# Patient Record
Sex: Male | Born: 1945 | Race: White | Hispanic: No | Marital: Married | Smoking: Former smoker
Health system: Southern US, Community
[De-identification: ages and names within clinical notes are randomized; demographics above are authoritative.]

## PROBLEM LIST (undated history)

## (undated) DIAGNOSIS — F329 Major depressive disorder, single episode, unspecified: Secondary | ICD-10-CM

## (undated) DIAGNOSIS — G2581 Restless legs syndrome: Secondary | ICD-10-CM

## (undated) DIAGNOSIS — C4490 Unspecified malignant neoplasm of skin, unspecified: Secondary | ICD-10-CM

## (undated) DIAGNOSIS — C679 Malignant neoplasm of bladder, unspecified: Secondary | ICD-10-CM

## (undated) DIAGNOSIS — G4730 Sleep apnea, unspecified: Secondary | ICD-10-CM

## (undated) DIAGNOSIS — K219 Gastro-esophageal reflux disease without esophagitis: Secondary | ICD-10-CM

## (undated) DIAGNOSIS — F32A Depression, unspecified: Secondary | ICD-10-CM

## (undated) DIAGNOSIS — M1990 Unspecified osteoarthritis, unspecified site: Secondary | ICD-10-CM

## (undated) DIAGNOSIS — E119 Type 2 diabetes mellitus without complications: Secondary | ICD-10-CM

## (undated) DIAGNOSIS — N189 Chronic kidney disease, unspecified: Secondary | ICD-10-CM

## (undated) DIAGNOSIS — I10 Essential (primary) hypertension: Secondary | ICD-10-CM

## (undated) DIAGNOSIS — I499 Cardiac arrhythmia, unspecified: Secondary | ICD-10-CM

## (undated) DIAGNOSIS — C7951 Secondary malignant neoplasm of bone: Secondary | ICD-10-CM

## (undated) DIAGNOSIS — IMO0001 Reserved for inherently not codable concepts without codable children: Secondary | ICD-10-CM

## (undated) DIAGNOSIS — F419 Anxiety disorder, unspecified: Secondary | ICD-10-CM

## (undated) HISTORY — DX: Sleep apnea, unspecified: G47.30

## (undated) HISTORY — DX: Major depressive disorder, single episode, unspecified: F32.9

## (undated) HISTORY — DX: Depression, unspecified: F32.A

## (undated) HISTORY — DX: Malignant neoplasm of bladder, unspecified: C67.9

## (undated) HISTORY — DX: Unspecified malignant neoplasm of skin, unspecified: C44.90

## (undated) HISTORY — PX: KNEE ARTHROSCOPY: SUR90

## (undated) HISTORY — DX: Anxiety disorder, unspecified: F41.9

## (undated) HISTORY — DX: Essential (primary) hypertension: I10

## (undated) HISTORY — PX: APPENDECTOMY: SHX54

## (undated) HISTORY — DX: Unspecified osteoarthritis, unspecified site: M19.90

## (undated) HISTORY — PX: JOINT REPLACEMENT: SHX530

## (undated) HISTORY — DX: Type 2 diabetes mellitus without complications: E11.9

---

## 2010-03-09 ENCOUNTER — Ambulatory Visit: Payer: Self-pay | Admitting: Family Medicine

## 2010-03-09 ENCOUNTER — Emergency Department: Payer: Self-pay | Admitting: Internal Medicine

## 2010-07-25 HISTORY — PX: REPLACEMENT TOTAL KNEE: SUR1224

## 2010-08-16 ENCOUNTER — Ambulatory Visit: Payer: Self-pay | Admitting: Unknown Physician Specialty

## 2010-08-17 ENCOUNTER — Ambulatory Visit: Payer: Self-pay | Admitting: Unknown Physician Specialty

## 2010-08-23 ENCOUNTER — Inpatient Hospital Stay: Payer: Self-pay | Admitting: Unknown Physician Specialty

## 2010-09-27 ENCOUNTER — Encounter: Payer: Self-pay | Admitting: Unknown Physician Specialty

## 2015-01-29 ENCOUNTER — Ambulatory Visit: Payer: Medicare Other | Attending: Internal Medicine

## 2015-01-29 DIAGNOSIS — G4733 Obstructive sleep apnea (adult) (pediatric): Secondary | ICD-10-CM | POA: Insufficient documentation

## 2015-02-26 ENCOUNTER — Ambulatory Visit: Payer: Medicare Other | Attending: Internal Medicine

## 2015-02-26 DIAGNOSIS — G4733 Obstructive sleep apnea (adult) (pediatric): Secondary | ICD-10-CM | POA: Insufficient documentation

## 2016-01-13 ENCOUNTER — Other Ambulatory Visit: Payer: Self-pay | Admitting: Family Medicine

## 2016-01-13 DIAGNOSIS — R109 Unspecified abdominal pain: Secondary | ICD-10-CM

## 2016-01-18 ENCOUNTER — Ambulatory Visit
Admission: RE | Admit: 2016-01-18 | Discharge: 2016-01-18 | Disposition: A | Discharge status: Home / Self Care | Payer: Medicare Other | Source: Ambulatory Visit | Attending: Family Medicine | Admitting: Family Medicine

## 2016-01-18 ENCOUNTER — Ambulatory Visit: Payer: Medicare Other

## 2016-01-18 DIAGNOSIS — R109 Unspecified abdominal pain: Secondary | ICD-10-CM | POA: Diagnosis present

## 2016-01-18 DIAGNOSIS — N281 Cyst of kidney, acquired: Secondary | ICD-10-CM | POA: Insufficient documentation

## 2016-02-15 ENCOUNTER — Encounter: Payer: Self-pay | Admitting: Urology

## 2016-02-15 ENCOUNTER — Ambulatory Visit (INDEPENDENT_AMBULATORY_CARE_PROVIDER_SITE_OTHER): Payer: Medicare Other | Admitting: Urology

## 2016-02-15 VITALS — BP 203/92 | HR 69 | Ht 73.0 in | Wt 306.0 lb

## 2016-02-15 DIAGNOSIS — R3129 Other microscopic hematuria: Secondary | ICD-10-CM | POA: Diagnosis not present

## 2016-02-15 DIAGNOSIS — R109 Unspecified abdominal pain: Secondary | ICD-10-CM

## 2016-02-15 LAB — URINALYSIS, COMPLETE
BILIRUBIN UA: NEGATIVE
Glucose, UA: NEGATIVE
KETONES UA: NEGATIVE
LEUKOCYTES UA: NEGATIVE
NITRITE UA: NEGATIVE
PH UA: 5.5 (ref 5.0–7.5)
SPEC GRAV UA: 1.015 (ref 1.005–1.030)
UUROB: 0.2 mg/dL (ref 0.2–1.0)

## 2016-02-15 LAB — MICROSCOPIC EXAMINATION: BACTERIA UA: NONE SEEN

## 2016-02-15 NOTE — Progress Notes (Signed)
02/15/2016 3:09 PM   Tyler Henderson 1946/01/13 [id number]  Referring provider: Sofie Hartigan, MD [place], [place] [address]  Chief Complaint  Patient presents with  . New Patient (Initial Visit)    Hematuria    HPI: Patient is a 70 -year-old Caucasian male who presents today as a referral from their PCP, Dr. Ellison Hughs, for microscopic hematuria.  Patient was found to have microscopic hematuria on 01/13/2016 with 0-3 RBC's/hpf.  Patient  does have a prior history of microscopic hematuria.  UA in 01/2014 was positive for 0-3 RBC's/hpf.  He states his urine has been darker.  He does have a prior history of nephrolithiasis, but denies trauma to the genitourinary tract, BPH or malignancies of the genitourinary tract.   He does not have a family medical history of nephrolithiasis, malignancies of the genitourinary tract or hematuria.    Today, he is having symptoms of nocturia x 4.  He is not having incontinence, hesitancy, intermittency, straining to urinate or a weak urinary stream.  His UA today does not demonstrate hematuria.  He has been experiencing right flank pain,  but denies any suprapubic pain and/or abdominal pain.  He denies any recent fevers, chills, nausea or vomiting.   Patient has had a recent RUS which noted mid and lower pole cysts on the left and midpole cyst on the right.  There is no hydronephrosis nor evidence of stones. The urinary bladder is unremarkable.  I have personally viewed the films.    He is a former smoker, with a 30 ppd history.  Quit 20 years ago.  He is not exposed to secondhand smoke.  He does not work with chemicals.    He does drink a lot of [employer] and [employer]'s.     PMH: Past Medical History:  Diagnosis Date  . Anxiety   . Arthritis   . Depression   . Diabetes ([place])   . Hypertension   . Sleep apnea     Surgical History: Past Surgical History:  Procedure Laterality Date    . REPLACEMENT TOTAL KNEE Left 2012    Home Medications:    Medication List       Accurate as of 02/15/16  3:09 PM. Always use your most recent med list.          amLODipine 10 MG tablet Commonly known as:  NORVASC Take 1 tablet by mouth once a day   aspirin EC 81 MG tablet Take by mouth.   atenolol 25 MG tablet Commonly known as:  TENORMIN Take 1 tablet by mouth once daily   atorvastatin 40 MG tablet Commonly known as:  LIPITOR Take 1 tablet by mouth once a day   carvedilol 12.5 MG tablet Commonly known as:  COREG Take 1 tablet by mouth  twice a day   FISH OIL PO Take by mouth.   furosemide 20 MG tablet Commonly known as:  LASIX Take by mouth.   gabapentin 300 MG capsule Commonly known as:  NEURONTIN Take by mouth.   glipiZIDE 10 MG tablet Commonly known as:  GLUCOTROL Take 1 tablet by mouth once a day   lisinopril 40 MG tablet Commonly known as:  PRINIVIL,ZESTRIL Take 1 tablet by mouth once daily   metFORMIN 1000 MG tablet Commonly known as:  GLUCOPHAGE Take 1 tablet by mouth  twice a day with meals   MULTI-VITAMINS Tabs Take by mouth.   omeprazole 20 MG capsule  Commonly known as:  PRILOSEC Take 1 capsule by mouth two times daily   pioglitazone 45 MG tablet Commonly known as:  ACTOS Take 1 tablet by mouth  daily   potassium chloride 10 MEQ tablet Commonly known as:  K-DUR Take 1 tablet by mouth once daily       Allergies: No Known Allergies  Family History: Family History  Problem Relation Age of Onset  . Cancer Mother     Breast  . Heart failure Father   . Prostate cancer Neg Hx   . Bladder Cancer Neg Hx     Social History:  reports that he quit smoking about 38 years ago. He has quit using smokeless tobacco. He reports that he drinks about 1.2 oz of alcohol per week . His drug history is not on file.  ROS: UROLOGY Frequent Urination?: No Hard to postpone urination?: No Burning/pain with urination?: No Get up at night to  urinate?: Yes Leakage of urine?: No Urine stream starts and stops?: No Trouble starting stream?: Yes Do you have to strain to urinate?: No Blood in urine?: No Urinary tract infection?: No Sexually transmitted disease?: Yes Injury to kidneys or bladder?: No Painful intercourse?: No Weak stream?: No Erection problems?: No Penile pain?: No  Gastrointestinal Nausea?: No Vomiting?: No Indigestion/heartburn?: No Diarrhea?: No Constipation?: Yes  Constitutional Fever: No Night sweats?: No Weight loss?: No Fatigue?: Yes  Skin Skin rash/lesions?: No Itching?: No  Eyes Blurred vision?: No Double vision?: No  Ears/Nose/Throat Sore throat?: No Sinus problems?: No  Hematologic/Lymphatic Swollen glands?: No Easy bruising?: No  Cardiovascular Leg swelling?: Yes Chest pain?: No  Respiratory Cough?: No Shortness of breath?: No  Endocrine Excessive thirst?: No  Musculoskeletal Back pain?: Yes Joint pain?: Yes  Neurological Headaches?: No Dizziness?: No  Psychologic Depression?: No Anxiety?: No  Physical Exam: BP (!) 203/92 (BP Location: Left Arm, Patient Position: Sitting, Cuff Size: Large)   Pulse 69   Ht 6\' 1"  (1.854 m)   Wt (!) 306 lb (138.8 kg)   BMI 40.37 kg/m   Constitutional: Well nourished. Alert and oriented, No acute distress. HEENT: [place] AT, moist mucus membranes. Trachea midline, no masses. Cardiovascular: No clubbing, cyanosis, or edema. Respiratory: Normal respiratory effort, no increased work of breathing. GI: Abdomen is soft, non tender, non distended, no abdominal masses. Liver and spleen not palpable.  No hernias appreciated.  Stool sample for occult testing is not indicated.   GU: No CVA tenderness.  No bladder fullness or masses.  Patient with uncircumcised phallus.   Foreskin easily retracted Urethral meatus is patent.  No penile discharge. No penile lesions or rashes. Scrotum without lesions, cysts, rashes and/or edema.  Testicles are  located scrotally bilaterally. No masses are appreciated in the testicles. Left and right epididymis are normal. Rectal: Patient with  normal sphincter tone. Anus and perineum without scarring or rashes. No rectal masses are appreciated. Prostate is approximately 50 grams, no nodules are appreciated. Seminal vesicles are normal. Skin: No rashes, bruises or suspicious lesions. Lymph: No cervical or inguinal adenopathy. Neurologic: Grossly intact, no focal deficits, moving all 4 extremities. Psychiatric: Normal mood and affect.  Laboratory Data: PSA in 2007 was 0.8  Urinalysis Not significant for hematuria.  See EPIC.  Pertinent Imaging: CLINICAL DATA:  Right flank pain  EXAM: RENAL / URINARY TRACT ULTRASOUND COMPLETE  COMPARISON:  None in PACs  FINDINGS: Right Kidney:  Length: 12.7 cm. Echogenicity within normal limits. There is a tiny midpole cyst measuring 1.3 x  0.9 cm. No hydronephrosis visualized.  Left Kidney:  Length: 14.5 cm. The echotexture of the renal parenchyma is normal similar to that on the right. There is a dominant midpole cyst that is partially exophytic measuring 5.8 x 5.4 x 7 cm. There is a lower pole cortical cyst measuring 1.7 x 1.2 by 1.6 cm. There is no hydronephrosis.  Bladder:  Appears normal for degree of bladder distention. Bilateral ureteral jets are observed.  IMPRESSION: Mid and lower pole cysts on the left and midpole cyst on the right. There is no hydronephrosis nor evidence of stones. The urinary bladder is unremarkable.   Electronically Signed   By: David  Martinique M.D.   On: 01/18/2016 10:36   Assessment & Plan:     1. Microscopic hematuria:   I explained to the patient that there are a number of causes that can be associated with blood in the urine, such as stones, BPH, UTI's, damage to the urinary tract and/or cancer.  At this time, I felt that the patient warranted further urologic evaluation.   The AUA guidelines  state that a CT urogram is the preferred imaging study to evaluate hematuria.  I explained to the patient that a contrast material will be injected into a vein and that in rare instances, an allergic reaction can result and may even life threatening   The patient denies any allergies to contrast, iodine and/or seafood and is taking metformin.  Following the imaging study,  I've recommended a cystoscopy. I described how this is performed, typically in an office setting with a flexible cystoscope. We described the risks, benefits, and possible side effects, the most common of which is a minor amount of blood in the urine and/or burning which usually resolves in 24 to 48 hours.    The patient had the opportunity to ask questions which were answered. Based upon this discussion, the patient is willing to proceed. Therefore, I've ordered: a CT Urogram and cystoscopy.  He will return following all of the above for discussion of the results.     2. Right flank pain:   Patient will be undergoing a CT Urogram in the future.    Return for CT Urogram report and cystoscopy.  These notes generated with voice recognition software. I apologize for typographical errors.  Zara Council, [facility] [address] [place], [place] [address] [phone]

## 2016-02-19 LAB — CULTURE, URINE COMPREHENSIVE

## 2016-02-23 ENCOUNTER — Ambulatory Visit
Admission: RE | Admit: 2016-02-23 | Discharge: 2016-02-23 | Disposition: A | Discharge status: Home / Self Care | Payer: Medicare Other | Source: Ambulatory Visit | Attending: Urology | Admitting: Urology

## 2016-02-23 ENCOUNTER — Other Ambulatory Visit
Admission: RE | Admit: 2016-02-23 | Discharge: 2016-02-23 | Disposition: A | Discharge status: Home / Self Care | Payer: Medicare Other | Source: Ambulatory Visit | Attending: Urology | Admitting: Urology

## 2016-02-23 DIAGNOSIS — K760 Fatty (change of) liver, not elsewhere classified: Secondary | ICD-10-CM | POA: Insufficient documentation

## 2016-02-23 DIAGNOSIS — R3129 Other microscopic hematuria: Secondary | ICD-10-CM | POA: Insufficient documentation

## 2016-02-23 DIAGNOSIS — I700 Atherosclerosis of aorta: Secondary | ICD-10-CM | POA: Diagnosis not present

## 2016-02-23 LAB — BUN: BUN: 36 mg/dL — ABNORMAL HIGH (ref 6–20)

## 2016-02-23 LAB — CREATININE, SERUM
Creatinine, Ser: 1.63 mg/dL — ABNORMAL HIGH (ref 0.61–1.24)
GFR calc non Af Amer: 40 mL/min — ABNORMAL LOW (ref >60)
GFR, EST AFRICAN AMERICAN: 47 mL/min — AB (ref >60)

## 2016-02-23 MED ORDER — IOPAMIDOL (ISOVUE-300) INJECTION 61%
100.0000 mL | Freq: Once | INTRAVENOUS | Status: AC | PRN
  Administered 2016-02-23: 100 mL via INTRAVENOUS

## 2016-03-07 ENCOUNTER — Ambulatory Visit (INDEPENDENT_AMBULATORY_CARE_PROVIDER_SITE_OTHER): Payer: Medicare Other | Admitting: Urology

## 2016-03-07 DIAGNOSIS — N183 Chronic kidney disease, stage 3 unspecified: Secondary | ICD-10-CM | POA: Insufficient documentation

## 2016-03-07 DIAGNOSIS — N281 Cyst of kidney, acquired: Secondary | ICD-10-CM

## 2016-03-07 DIAGNOSIS — R3129 Other microscopic hematuria: Secondary | ICD-10-CM | POA: Diagnosis not present

## 2016-03-07 DIAGNOSIS — N200 Calculus of kidney: Secondary | ICD-10-CM

## 2016-03-07 LAB — URINALYSIS, COMPLETE
BILIRUBIN UA: NEGATIVE
GLUCOSE, UA: NEGATIVE
KETONES UA: NEGATIVE
LEUKOCYTES UA: NEGATIVE
NITRITE UA: NEGATIVE
SPEC GRAV UA: 1.015 (ref 1.005–1.030)
Urobilinogen, Ur: 0.2 mg/dL (ref 0.2–1.0)
pH, UA: 7.5 (ref 5.0–7.5)

## 2016-03-07 LAB — MICROSCOPIC EXAMINATION
BACTERIA UA: NONE SEEN
WBC, UA: NONE SEEN /hpf (ref 0–?)

## 2016-03-07 MED ORDER — LIDOCAINE HCL 2 % EX GEL
1.0000 "application " | Freq: Once | CUTANEOUS | Status: AC
  Administered 2016-03-07: 1 via URETHRAL

## 2016-03-07 MED ORDER — CIPROFLOXACIN HCL 500 MG PO TABS
500.0000 mg | ORAL_TABLET | Freq: Once | ORAL | Status: AC
  Administered 2016-03-07: 500 mg via ORAL

## 2016-03-07 NOTE — Progress Notes (Signed)
03/07/2016 6:32 AM   Tyler Henderson 15-Sep-1945 [id number]  Referring provider: Sofie Hartigan, MD [place], [place] [address]  No chief complaint on file.   HPI:  1. Bladder Cancer - blood on UA noted by PCP x several and at Urol office. Prior 30PY smoker. CT Urogram 02/2016 with 26mm renal stone / no worriseom masses. Cysto 02/2016 with about dime sized papillary tumor just lateral to Rt UO, mostly flat.   2. Simple renal cyst - Lt>Rt non-complex cysts without mass effect on CT urogram 02/2016. Dominant cyst LLP 7cm.   3. Nephrolithiasis  - 83mm stone Rt kidney non-obstructing by CT 02/2016. Several prior stones passed medically.   4. Chronic renal failure, stage 3  - GFR upper 40's by serum markers 2017. CT w/o hydro. He is diabetic and with CHF.  Today " Tyler Henderson " is seen for cysto to complete hematuria eval.    PMH: Past Medical History:  Diagnosis Date  . Anxiety   . Arthritis   . Depression   . Diabetes ([place])   . Hypertension   . Sleep apnea     Surgical History: Past Surgical History:  Procedure Laterality Date  . REPLACEMENT TOTAL KNEE Left 2012    Home Medications:    Medication List       Accurate as of 03/07/16  6:32 AM. Always use your most recent med list.          amLODipine 10 MG tablet Commonly known as:  NORVASC Take 1 tablet by mouth once a day   aspirin EC 81 MG tablet Take by mouth.   atenolol 25 MG tablet Commonly known as:  TENORMIN Take 1 tablet by mouth once daily   atorvastatin 40 MG tablet Commonly known as:  LIPITOR Take 1 tablet by mouth once a day   carvedilol 12.5 MG tablet Commonly known as:  COREG Take 1 tablet by mouth  twice a day   FISH OIL PO Take by mouth.   furosemide 20 MG tablet Commonly known as:  LASIX Take by mouth.   gabapentin 300 MG capsule Commonly known as:  NEURONTIN Take by mouth.   glipiZIDE 10 MG tablet Commonly known as:   GLUCOTROL Take 1 tablet by mouth once a day   lisinopril 40 MG tablet Commonly known as:  PRINIVIL,ZESTRIL Take 1 tablet by mouth once daily   metFORMIN 1000 MG tablet Commonly known as:  GLUCOPHAGE Take 1 tablet by mouth  twice a day with meals   MULTI-VITAMINS Tabs Take by mouth.   omeprazole 20 MG capsule Commonly known as:  PRILOSEC Take 1 capsule by mouth two times daily   pioglitazone 45 MG tablet Commonly known as:  ACTOS Take 1 tablet by mouth  daily   potassium chloride 10 MEQ tablet Commonly known as:  K-DUR Take 1 tablet by mouth once daily       Allergies: No Known Allergies  Family History: Family History  Problem Relation Age of Onset  . Cancer Mother     Breast  . Heart failure Father   . Prostate cancer Neg Hx   . Bladder Cancer Neg Hx     Social History:  reports that he quit smoking about 38 years ago. He has quit using smokeless tobacco. He reports that he drinks about 1.2 oz of alcohol per week . His drug history is not on file.   Review of Systems  Gastrointestinal (  upper)  : Negative for upper GI symptoms  Gastrointestinal (lower) : Negative for lower GI symptoms  Constitutional : Negative for symptoms  Skin: Negative for skin symptoms  Eyes: Negative for eye symptoms  Ear/Nose/Throat : Negative for Ear/Nose/Throat symptoms  Hematologic/Lymphatic: Negative for Hematologic/Lymphatic symptoms  Cardiovascular : Negative for cardiovascular symptoms  Respiratory : sleep apnea  Endocrine: Negative for endocrine symptoms  Musculoskeletal: Negative for musculoskeletal symptoms  Neurological: Negative for neurological symptoms  Psychologic: Negative for psychiatric symptoms   Physical Exam: There were no vitals taken for this visit.  Constitutional:  Alert and oriented, No acute distress. HEENT: [place] AT, moist mucus membranes.  Trachea midline, no masses. Cardiovascular: No clubbing, cyanosis, or edema. Respiratory:  Normal respiratory effort, no increased work of breathing. GI: Abdomen is soft, nontender, nondistended, no abdominal masses GU: No CVA tenderness.  Skin: No rashes, bruises or suspicious lesions. Lymph: No cervical or inguinal adenopathy. Neurologic: Grossly intact, no focal deficits, moving all 4 extremities. Psychiatric: Normal mood and affect.  Laboratory Data: No results found for: WBC, HGB, HCT, MCV, PLT  Lab Results  Component Value Date   CREATININE 1.63 (H) 02/23/2016    No results found for: PSA  No results found for: TESTOSTERONE  No results found for: HGBA1C  Urinalysis    Component Value Date/Time   APPEARANCEUR Clear 02/15/2016 1432   GLUCOSEU Negative 02/15/2016 1432   BILIRUBINUR Negative 02/15/2016 1432   PROTEINUR 2+ (A) 02/15/2016 1432   NITRITE Negative 02/15/2016 1432   LEUKOCYTESUR Negative 02/15/2016 1432       Cystoscopy Procedure Note  Patient identification was confirmed, informed consent was obtained, and patient was prepped using Betadine solution.  Lidocaine jelly was administered per urethral meatus.    Preoperative abx where received prior to procedure.     Pre-Procedure: - Inspection reveals a normal caliber ureteral meatus.  Procedure: The flexible cystoscope was introduced without difficulty - No urethral strictures/lesions are present. - Normal prostate  - Normal bladder neck - Bilateral ureteral orifices identified - No bladder stones - No trabeculation  Dime sized predominantly flat papillary tumor lateral to Rt UO noted.   Retroflexion shows no additional findints   Post-Procedure: - Patient tolerated the procedure well   Pertinent Imaging: as per above  Assessment & Plan:   1. Bladder Cancer  -  Likely small low grade tumor by cysto appearance, but do rec formal TURBT for histologic analysis. Given proximity to Rt UO, may need Rt retrograde / stent. As lesions does not involved UO, I do not feel ureteroscopy is  needed.   Risks, benefits, alternatives, peri-op Henderson, need for possible temporary foley post-op discussed. UA today normal.    2. Simple renal cyst  - no mass effect or enhancing nodules. No indication for further evaluation.   3. Nephrolithiasis - very small non-obstructing stone burden. Rec observation. THis would likely pass with medical therapy should it drop.   4. Chronic renal failure, stage 3 (moderate)  - medical renal disesae. Reinforced importance of BP and glycemic control.     Alexis Frock, [facility] [address] [place], [place] [address] [phone]

## 2016-03-08 ENCOUNTER — Other Ambulatory Visit: Payer: Self-pay | Admitting: Urology

## 2016-03-08 ENCOUNTER — Telehealth: Payer: Self-pay | Admitting: Radiology

## 2016-03-08 ENCOUNTER — Other Ambulatory Visit: Payer: Self-pay | Admitting: Radiology

## 2016-03-08 DIAGNOSIS — D494 Neoplasm of unspecified behavior of bladder: Secondary | ICD-10-CM

## 2016-03-08 NOTE — Telephone Encounter (Signed)
Notified pt's wife, Blanch Media, of surgery scheduled 03/14/16, pre-admit testing appt on 03/09/16 @2 :00 & to call Friday prior to surgery for arrival time to SDS. Blanch Media voices understanding.

## 2016-03-09 ENCOUNTER — Encounter
Admission: RE | Admit: 2016-03-09 | Discharge: 2016-03-09 | Disposition: A | Discharge status: Home / Self Care | Payer: Medicare Other | Source: Ambulatory Visit | Attending: Urology | Admitting: Urology

## 2016-03-09 DIAGNOSIS — Z01812 Encounter for preprocedural laboratory examination: Secondary | ICD-10-CM | POA: Insufficient documentation

## 2016-03-09 DIAGNOSIS — Z01810 Encounter for preprocedural cardiovascular examination: Secondary | ICD-10-CM | POA: Insufficient documentation

## 2016-03-09 HISTORY — DX: Reserved for inherently not codable concepts without codable children: IMO0001

## 2016-03-09 HISTORY — DX: Chronic kidney disease, unspecified: N18.9

## 2016-03-09 HISTORY — DX: Gastro-esophageal reflux disease without esophagitis: K21.9

## 2016-03-09 HISTORY — DX: Restless legs syndrome: G25.81

## 2016-03-09 LAB — BASIC METABOLIC PANEL
ANION GAP: 10 (ref 5–15)
BUN: 25 mg/dL — ABNORMAL HIGH (ref 6–20)
CALCIUM: 9.3 mg/dL (ref 8.9–10.3)
CO2: 28 mmol/L (ref 22–32)
Chloride: 102 mmol/L (ref 101–111)
Creatinine, Ser: 1.53 mg/dL — ABNORMAL HIGH (ref 0.61–1.24)
GFR, EST AFRICAN AMERICAN: 51 mL/min — AB (ref >60)
GFR, EST NON AFRICAN AMERICAN: 44 mL/min — AB (ref >60)
Glucose, Bld: 138 mg/dL — ABNORMAL HIGH (ref 65–99)
POTASSIUM: 4.3 mmol/L (ref 3.5–5.1)
SODIUM: 140 mmol/L (ref 135–145)

## 2016-03-09 LAB — CBC
HEMATOCRIT: 35.1 % — AB (ref 40.0–52.0)
HEMOGLOBIN: 12 g/dL — AB (ref 13.0–18.0)
MCH: 31 pg (ref 26.0–34.0)
MCHC: 34.3 g/dL (ref 32.0–36.0)
MCV: 90.3 fL (ref 80.0–100.0)
Platelets: 217 10*3/uL (ref 150–440)
RBC: 3.89 MIL/uL — ABNORMAL LOW (ref 4.40–5.90)
RDW: 12.8 % (ref 11.5–14.5)
WBC: 7.6 10*3/uL (ref 3.8–10.6)

## 2016-03-09 NOTE — Patient Instructions (Signed)
  Your procedure is scheduled PA:[id number] 21. 2017 (Monday)  Report to Same Day Surgery 2nd floor [facility] To find out your arrival time please call [phone] between 1PM - 3PM on March 11, 2016 (Friday)  Remember: Instructions that are not followed completely may result in serious medical risk, up to and including death, or upon the discretion of your surgeon and anesthesiologist your surgery may need to be rescheduled.    _x___ 1. Do not eat food or drink liquids after midnight. No gum chewing or hard candies.     _x_ 2. No Alcohol for 24 hours before or after surgery.   _x___3. No Smoking for 24 prior to surgery.   ____  4. Bring all medications with you on the day of surgery if instructed.    __x__ 5. Notify your doctor if there is any change in your medical condition     (cold, fever, infections).     Do not wear jewelry, make-up, hairpins, clips or nail polish.  Do not wear lotions, powders, or perfumes. You may wear deodorant.  Do not shave 48 hours prior to surgery. Men may shave face and neck.  Do not bring valuables to the hospital.    [facility] is not responsible for any belongings or valuables.               Contacts, dentures or bridgework may not be worn into surgery.  Leave your suitcase in the car. After surgery it may be brought to your room.  For patients admitted to the hospital, discharge time is determined by your treatment team.   Patients discharged the day of surgery will not be allowed to drive home.    Please read over the following fact sheets that you were given:   [facility] Preparing for Surgery and or MRSA Information   _x___ Take these medicines the morning of surgery with A SIP OF WATER:    1. Carvedilol  2. Lisinopril  3. Omeprazole  4.  5.  6.  ____ Fleet Enema (as directed)   ___ Use CHG Soap or sage wipes as directed on instruction sheet   ____ Use inhalers on the day of surgery and bring to hospital day of surgery  _x___  Stop metformin 2 days prior to surgery (Stop Metformin on August 19)    ____ Take 1/2 of usual insulin dose the night before surgery and none on the morning of surgery            _x___ Stop aspirin or coumadin, or plavix (patient stopped Aspirin on August 7)  _x__ Stop Anti-inflammatories such as Advil, Aleve, Ibuprofen, Motrin, Naproxen,          Naprosyn, Goodies powders or aspirin products. Ok to take Tylenol.   _x___ Stop supplements until after surgery.  (Stop Fish Oil and Multi Vitamin now until after surgery)  _x___ Bring C-Pap to the hospital.

## 2016-03-09 NOTE — Pre-Procedure Instructions (Signed)
  Result Narrative   [facility], [facility] [id number] [place], [place], [place] [id number] Acct #: [id number]  Date: 01/01/2015 09:58 AM ECHOCARDIOGRAM REPORTAdult Male Age: 70 yrs  Outpatient  KC::[facility]  STUDY:CHEST WALL TAPE:0000:00: 0:00:00MD1:FATH, KEN ECHO:Yes DOPPLER:YesFILE:0000-000-000BP: 180/100 mmHg  COLOR:YesCONTRAST:No MACHINE:PhilipsHR: 64  RV BIOPSY:No 3D:NoSOUND QLTY:Moderate Height: 73 in MEDIUM:NoneWeight: 332 lb  BSA: 2.7 m2  ___________________________________________________________________________________________  HISTORY:DOE REASON:Assess, LV function  ___________________________________________________________________________________________ ECHOCARDIOGRAPHIC MEASUREMENTS 2D DIMENSIONS AORTA ValuesNormal RangeMAIN PAValuesNormal Range Annulus:nm* [2.3 - 2.9]PA Main:nm* [1.5 - 2.1] Aorta Sin:nm* [3.1 - 3.7] RIGHT VENTRICLE ST Junction:nm* [2.6 - 3.2]RV Base:nm* [ < 4.2] Asc.Aorta:nm* [2.6 - 3.4] RV Mid:nm* [ < 3.5]  LEFT VENTRICLERV Length:nm* [ < 8.6] LVIDd:nm* [4.2 - 5.9] INFERIOR  VENA CAVA LVIDs:nm* Max. IVC:nm* [ <= 2.1]  FS:nm* [> 25]Min. IVC:nm* SWT:1.4 cm[0.6 - 1.0] ------------------ PWT:1.2 cm[0.6 - 1.0] nm* - not measured  LEFT ATRIUM LA Diam:4.7 cm[3.0 - 4.0] LA A4C Area:nm* [ < 20] LA Volume:nm* Roice.Felt - 58]  ___________________________________________________________________________________________ ECHOCARDIOGRAPHIC DESCRIPTIONS  AORTIC ROOT Size:Normal Dissection:INDETERM FOR DISSECTION  AORTIC VALVE Leaflets:TricuspidMorphology:Normal Mobility:Fully mobile  LEFT VENTRICLE Size:Normal Anterior:Normal  Contraction:NormalLateral:Normal Closest EF:>55% (Estimated) Septal:Normal  LV Masses:No MassesApical:Normal  [id number] LVH CONCENTRICInferior:Normal  Posterior:Normal Dias.FxClass:N/A  MITRAL VALVE Leaflets:Normal Mobility:Fully mobile Morphology:Normal  LEFT ATRIUM Size:NormalLA Masses:No masses  IA Septum:Normal IAS  MAIN PA Size:Normal  PULMONIC VALVE Morphology:Normal Mobility:Fully mobile  RIGHT VENTRICLE  RV Masses:No MassesSize:Normal  Free Wall:NormalContraction:Normal  TRICUSPID VALVE Leaflets:Normal Mobility:Fully mobile Morphology:Normal  RIGHT ATRIUM Size:Normal RA Other:None  RA Mass:No masses  PERICARDIUM  Fluid:No effusion  INFERIOR VENACAVA Size:Normal Normal respiratory collapse   _____________________________________________________________ DOPPLER ECHO and OTHER SPECIAL  PROCEDURES  Aortic:No AR No AS   Mitral:TRIVIAL MRNo MS MV Inflow E Vel=88.5 cm/sec MV Annulus E'Vel=nm* E/E'Ratio=nm*  Tricuspid:TRIVIAL TRNo TS  Pulmonary:TRIVIAL PRNo PS    ___________________________________________________________________________________________  INTERPRETATION Closest EF: >55% (Estimated) Contraction: Normal LVH: CONCENTRIC LVH: MILD LVH Mitral: TRIVIAL MR Tricuspid: TRIVIAL TR

## 2016-03-14 ENCOUNTER — Encounter: Payer: Self-pay | Admitting: *Deleted

## 2016-03-14 ENCOUNTER — Ambulatory Visit: Payer: Medicare Other | Admitting: Anesthesiology

## 2016-03-14 ENCOUNTER — Ambulatory Visit
Admission: RE | Admit: 2016-03-14 | Discharge: 2016-03-14 | Disposition: A | Discharge status: Home / Self Care | Payer: Medicare Other | Source: Ambulatory Visit | Attending: Urology | Admitting: Urology

## 2016-03-14 ENCOUNTER — Encounter
Admission: RE | Disposition: A | Discharge status: Home / Self Care | Payer: Self-pay | Source: Ambulatory Visit | Attending: Urology

## 2016-03-14 DIAGNOSIS — Z8249 Family history of ischemic heart disease and other diseases of the circulatory system: Secondary | ICD-10-CM | POA: Diagnosis not present

## 2016-03-14 DIAGNOSIS — N183 Chronic kidney disease, stage 3 (moderate): Secondary | ICD-10-CM | POA: Insufficient documentation

## 2016-03-14 DIAGNOSIS — Z79899 Other long term (current) drug therapy: Secondary | ICD-10-CM | POA: Insufficient documentation

## 2016-03-14 DIAGNOSIS — M1990 Unspecified osteoarthritis, unspecified site: Secondary | ICD-10-CM | POA: Diagnosis not present

## 2016-03-14 DIAGNOSIS — D494 Neoplasm of unspecified behavior of bladder: Secondary | ICD-10-CM | POA: Diagnosis not present

## 2016-03-14 DIAGNOSIS — E1122 Type 2 diabetes mellitus with diabetic chronic kidney disease: Secondary | ICD-10-CM | POA: Insufficient documentation

## 2016-03-14 DIAGNOSIS — F419 Anxiety disorder, unspecified: Secondary | ICD-10-CM | POA: Insufficient documentation

## 2016-03-14 DIAGNOSIS — F329 Major depressive disorder, single episode, unspecified: Secondary | ICD-10-CM | POA: Diagnosis not present

## 2016-03-14 DIAGNOSIS — R310 Gross hematuria: Secondary | ICD-10-CM | POA: Diagnosis not present

## 2016-03-14 DIAGNOSIS — C672 Malignant neoplasm of lateral wall of bladder: Secondary | ICD-10-CM | POA: Insufficient documentation

## 2016-03-14 DIAGNOSIS — N281 Cyst of kidney, acquired: Secondary | ICD-10-CM | POA: Insufficient documentation

## 2016-03-14 DIAGNOSIS — I129 Hypertensive chronic kidney disease with stage 1 through stage 4 chronic kidney disease, or unspecified chronic kidney disease: Secondary | ICD-10-CM | POA: Diagnosis not present

## 2016-03-14 DIAGNOSIS — Z7982 Long term (current) use of aspirin: Secondary | ICD-10-CM | POA: Diagnosis not present

## 2016-03-14 DIAGNOSIS — C679 Malignant neoplasm of bladder, unspecified: Secondary | ICD-10-CM | POA: Diagnosis present

## 2016-03-14 DIAGNOSIS — Z803 Family history of malignant neoplasm of breast: Secondary | ICD-10-CM | POA: Diagnosis not present

## 2016-03-14 DIAGNOSIS — N200 Calculus of kidney: Secondary | ICD-10-CM | POA: Diagnosis not present

## 2016-03-14 DIAGNOSIS — G4730 Sleep apnea, unspecified: Secondary | ICD-10-CM | POA: Diagnosis not present

## 2016-03-14 DIAGNOSIS — Z7984 Long term (current) use of oral hypoglycemic drugs: Secondary | ICD-10-CM | POA: Insufficient documentation

## 2016-03-14 DIAGNOSIS — R319 Hematuria, unspecified: Secondary | ICD-10-CM | POA: Diagnosis not present

## 2016-03-14 DIAGNOSIS — Z96652 Presence of left artificial knee joint: Secondary | ICD-10-CM | POA: Insufficient documentation

## 2016-03-14 DIAGNOSIS — Z87891 Personal history of nicotine dependence: Secondary | ICD-10-CM | POA: Insufficient documentation

## 2016-03-14 HISTORY — PX: TRANSURETHRAL RESECTION OF BLADDER TUMOR: SHX2575

## 2016-03-14 LAB — GLUCOSE, CAPILLARY
GLUCOSE-CAPILLARY: 184 mg/dL — AB (ref 65–99)
GLUCOSE-CAPILLARY: 188 mg/dL — AB (ref 65–99)

## 2016-03-14 SURGERY — TURBT (TRANSURETHRAL RESECTION OF BLADDER TUMOR)
Anesthesia: General | Laterality: Right | Wound class: Clean Contaminated

## 2016-03-14 MED ORDER — CEFAZOLIN SODIUM-DEXTROSE 2-4 GM/100ML-% IV SOLN
INTRAVENOUS | Status: AC
  Administered 2016-03-14: 2 g via INTRAVENOUS
  Filled 2016-03-14: qty 100

## 2016-03-14 MED ORDER — OXYBUTYNIN CHLORIDE 5 MG PO TABS: 5.0000 mg | ORAL_TABLET | Freq: Three times a day (TID) | ORAL | 0 refills | Status: DC | PRN

## 2016-03-14 MED ORDER — SUGAMMADEX SODIUM 500 MG/5ML IV SOLN
INTRAVENOUS | Status: DC | PRN
  Administered 2016-03-14: 300 mg via INTRAVENOUS

## 2016-03-14 MED ORDER — SUCCINYLCHOLINE CHLORIDE 20 MG/ML IJ SOLN
INTRAMUSCULAR | Status: DC | PRN
  Administered 2016-03-14: 150 mg via INTRAVENOUS

## 2016-03-14 MED ORDER — MEPERIDINE HCL 25 MG/ML IJ SOLN: 6.2500 mg | INTRAMUSCULAR | Status: DC | PRN

## 2016-03-14 MED ORDER — SODIUM CHLORIDE 0.9 % IV SOLN
INTRAVENOUS | Status: DC
  Administered 2016-03-14: 13:00:00 via INTRAVENOUS

## 2016-03-14 MED ORDER — PROPOFOL 10 MG/ML IV BOLUS
INTRAVENOUS | Status: DC | PRN
  Administered 2016-03-14: 200 mg via INTRAVENOUS

## 2016-03-14 MED ORDER — PROMETHAZINE HCL 25 MG/ML IJ SOLN: 6.2500 mg | INTRAMUSCULAR | Status: DC | PRN

## 2016-03-14 MED ORDER — EPHEDRINE SULFATE 50 MG/ML IJ SOLN
INTRAMUSCULAR | Status: DC | PRN
  Administered 2016-03-14: 10 mg via INTRAVENOUS

## 2016-03-14 MED ORDER — CEFAZOLIN SODIUM-DEXTROSE 2-4 GM/100ML-% IV SOLN
2.0000 g | INTRAVENOUS | Status: AC
  Administered 2016-03-14: 2 g via INTRAVENOUS

## 2016-03-14 MED ORDER — OXYCODONE HCL 5 MG/5ML PO SOLN: 5.0000 mg | Freq: Once | ORAL | Status: DC | PRN

## 2016-03-14 MED ORDER — FENTANYL CITRATE (PF) 100 MCG/2ML IJ SOLN
INTRAMUSCULAR | Status: DC | PRN
  Administered 2016-03-14: 100 ug via INTRAVENOUS

## 2016-03-14 MED ORDER — ROCURONIUM BROMIDE 100 MG/10ML IV SOLN
INTRAVENOUS | Status: DC | PRN
  Administered 2016-03-14: 10 mg via INTRAVENOUS

## 2016-03-14 MED ORDER — PHENAZOPYRIDINE HCL 200 MG PO TABS: 200.0000 mg | ORAL_TABLET | Freq: Three times a day (TID) | ORAL | 0 refills | Status: DC | PRN

## 2016-03-14 MED ORDER — DOCUSATE SODIUM 100 MG PO CAPS: 100.0000 mg | ORAL_CAPSULE | Freq: Two times a day (BID) | ORAL | 0 refills | Status: DC

## 2016-03-14 MED ORDER — HYDROCODONE-ACETAMINOPHEN 5-325 MG PO TABS: 1.0000 | ORAL_TABLET | Freq: Four times a day (QID) | ORAL | 0 refills | Status: DC | PRN

## 2016-03-14 MED ORDER — OXYCODONE HCL 5 MG PO TABS: 5.0000 mg | ORAL_TABLET | Freq: Once | ORAL | Status: DC | PRN

## 2016-03-14 MED ORDER — ONDANSETRON HCL 4 MG/2ML IJ SOLN
INTRAMUSCULAR | Status: DC | PRN
  Administered 2016-03-14: 4 mg via INTRAVENOUS

## 2016-03-14 MED ORDER — FENTANYL CITRATE (PF) 100 MCG/2ML IJ SOLN: 25.0000 ug | INTRAMUSCULAR | Status: DC | PRN

## 2016-03-14 MED ORDER — LIDOCAINE HCL (CARDIAC) 20 MG/ML IV SOLN
INTRAVENOUS | Status: DC | PRN
  Administered 2016-03-14: 100 mg via INTRAVENOUS

## 2016-03-14 SURGICAL SUPPLY — 40 items
BAG DRAIN CYSTO-URO LG1000N (MISCELLANEOUS) ×4 IMPLANT
BAG URO DRAIN 2000ML W/SPOUT (MISCELLANEOUS) ×4 IMPLANT
CATH FOL 2WAY LX 16X5 (CATHETERS) IMPLANT
CATH FOLEY 2WAY  5CC 16FR (CATHETERS) ×2
CATH URETL 5X70 OPEN END (CATHETERS) ×4 IMPLANT
CATH URTH 16FR FL 2W BLN LF (CATHETERS) ×2 IMPLANT
CONRAY 43 FOR UROLOGY 50M (MISCELLANEOUS) ×4 IMPLANT
CORD URO TURP 10FT (MISCELLANEOUS) ×4 IMPLANT
DRAPE UTILITY 15X26 TOWEL STRL (DRAPES) ×4 IMPLANT
DRESSING TELFA 4X3 1S ST N-ADH (GAUZE/BANDAGES/DRESSINGS) ×4 IMPLANT
ELECT LOOP 22F BIPOLAR SML (ELECTROSURGICAL) ×4
ELECT REM PT RETURN 9FT ADLT (ELECTROSURGICAL)
ELECTRODE LOOP 22F BIPOLAR SML (ELECTROSURGICAL) ×2 IMPLANT
ELECTRODE REM PT RTRN 9FT ADLT (ELECTROSURGICAL) IMPLANT
GLOVE BIO SURGEON STRL SZ 6.5 (GLOVE) ×3 IMPLANT
GLOVE BIO SURGEONS STRL SZ 6.5 (GLOVE) ×1
GOWN STRL REUS W/ TWL LRG LVL3 (GOWN DISPOSABLE) ×4 IMPLANT
GOWN STRL REUS W/ TWL LRG LVL4 (GOWN DISPOSABLE) ×4 IMPLANT
GOWN STRL REUS W/TWL LRG LVL3 (GOWN DISPOSABLE) ×4
GOWN STRL REUS W/TWL LRG LVL4 (GOWN DISPOSABLE) ×4
HOLDER FOLEY CATH W/STRAP (MISCELLANEOUS) IMPLANT
KIT RM TURNOVER CYSTO AR (KITS) ×4 IMPLANT
LOOP CUT BIPOLAR 24F LRG (ELECTROSURGICAL) IMPLANT
NDL SAFETY ECLIPSE 18X1.5 (NEEDLE) ×2 IMPLANT
NEEDLE HYPO 18GX1.5 SHARP (NEEDLE) ×2
PACK CYSTO AR (MISCELLANEOUS) ×4 IMPLANT
PLUG CATH AND CAP STER (CATHETERS) ×4 IMPLANT
PREP PVP WINGED SPONGE (MISCELLANEOUS) ×4 IMPLANT
SENSORWIRE 0.038 NOT ANGLED (WIRE) ×4
SET CYSTO W/LG BORE CLAMP LF (SET/KITS/TRAYS/PACK) ×4 IMPLANT
SET IRRIG Y TYPE TUR BLADDER L (SET/KITS/TRAYS/PACK) ×4 IMPLANT
SET IRRIGATING DISP (SET/KITS/TRAYS/PACK) ×4 IMPLANT
SOL .9 NS 3000ML IRR  AL (IV SOLUTION) ×2
SOL .9 NS 3000ML IRR UROMATIC (IV SOLUTION) ×2 IMPLANT
STENT URET 6FRX24 CONTOUR (STENTS) IMPLANT
STENT URET 6FRX26 CONTOUR (STENTS) IMPLANT
SURGILUBE 2OZ TUBE FLIPTOP (MISCELLANEOUS) ×4 IMPLANT
SYRINGE IRR TOOMEY STRL 70CC (SYRINGE) ×4 IMPLANT
WATER STERILE IRR 1000ML POUR (IV SOLUTION) ×4 IMPLANT
WIRE SENSOR 0.038 NOT ANGLED (WIRE) ×2 IMPLANT

## 2016-03-14 NOTE — Anesthesia Procedure Notes (Signed)
Procedure Name: Intubation Date/Time: 03/14/2016 1:40 PM Performed by: Aline Brochure Pre-anesthesia Checklist: Patient identified, Emergency Drugs available, Suction available and Patient being monitored Patient Re-evaluated:Patient Re-evaluated prior to inductionOxygen Delivery Method: Circle system utilized Preoxygenation: Pre-oxygenation with 100% oxygen Intubation Type: IV induction Ventilation: Oral airway inserted - appropriate to patient size Laryngoscope Size: Mac and 4 Grade View: Grade II Tube type: Oral Tube size: 7.0 mm Number of attempts: 1 Airway Equipment and Method: Stylet Placement Confirmation: ETT inserted through vocal cords under direct vision,  positive ETCO2 and breath sounds checked- equal and bilateral Secured at: 24 cm Tube secured with: Tape Dental Injury: Teeth and Oropharynx as per pre-operative assessment

## 2016-03-14 NOTE — Transfer of Care (Signed)
Immediate Anesthesia Transfer of Care Note  Patient: Tyler Henderson  Procedure(s) Performed: Procedure(s): TRANSURETHRAL RESECTION OF BLADDER TUMOR (TURBT) (N/A)  Patient Location: PACU  Anesthesia Type:General  Level of Consciousness: sedated  Airway & Oxygen Therapy: Patient connected to face mask oxygen  Post-op Assessment: Post -op Vital signs reviewed and stable  Post vital signs: stable  Last Vitals:  Vitals:   03/14/16 1153 03/14/16 1413  BP: (!) 217/99 (!) 174/80  Pulse: 82 80  Resp: 16 17  Temp: 37.1 C 36.3 C    Last Pain:  Vitals:   03/14/16 1413  TempSrc: Temporal         Complications: No apparent anesthesia complications

## 2016-03-14 NOTE — Anesthesia Preprocedure Evaluation (Signed)
Anesthesia Evaluation  Patient identified by MRN, date of birth, ID band  Reviewed: Allergy & Precautions, NPO status , Patient's Chart, lab work & pertinent test results, reviewed documented beta blocker date and time   History of Anesthesia Complications Negative for: history of anesthetic complications  Airway Mallampati: II  TM Distance: >3 FB Neck ROM: Full    Dental no notable dental hx.    Pulmonary sleep apnea and Continuous Positive Airway Pressure Ventilation , neg COPD, former smoker,    breath sounds clear to auscultation- rhonchi (-) wheezing      Cardiovascular Exercise Tolerance: Good hypertension, Pt. on medications and Pt. on home beta blockers (-) CAD and (-) Past MI  Rhythm:Regular Rate:Normal - Systolic murmurs and - Diastolic murmurs    Neuro/Psych negative neurological ROS  negative psych ROS   GI/Hepatic Neg liver ROS, GERD  ,  Endo/Other  diabetes, Type 2, Oral Hypoglycemic Agents  Renal/GU CRFRenal disease     Musculoskeletal  (+) Arthritis , Osteoarthritis,    Abdominal   Peds  Hematology negative hematology ROS (+)   Anesthesia Other Findings Past Medical History: No date: Anxiety No date: Arthritis No date: Chronic kidney disease No date: Depression No date: Diabetes (HCC) No date: GERD (gastroesophageal reflux disease) No date: Hypertension No date: Restless leg syndrome No date: Shortness of breath dyspnea     Comment: with exertion No date: Sleep apnea   Reproductive/Obstetrics                             Anesthesia Physical Anesthesia Plan  ASA: III  Anesthesia Plan: General   Post-op Pain Management:    Induction: Intravenous  Airway Management Planned: Oral ETT  Additional Equipment:   Intra-op Plan:   Post-operative Plan: Extubation in OR  Informed Consent: I have reviewed the patients History and Physical, chart, labs and discussed  the procedure including the risks, benefits and alternatives for the proposed anesthesia with the patient or authorized representative who has indicated his/her understanding and acceptance.   Dental advisory given  Plan Discussed with: CRNA and Anesthesiologist  Anesthesia Plan Comments:         Anesthesia Quick Evaluation

## 2016-03-14 NOTE — Discharge Instructions (Signed)
Transurethral Resection of Bladder Tumor (TURBT) or Bladder Biopsy ° ° °Definition: ° Transurethral Resection of the Bladder Tumor is a surgical procedure used to diagnose and remove tumors within the bladder. TURBT is the most common treatment for early stage bladder cancer. ° °General instructions: °   ° Your recent bladder surgery requires very little post hospital care but some definite precautions. ° °Despite the fact that no skin incisions were used, the area around the bladder incisions are raw and covered with scabs to promote healing and prevent bleeding. Certain precautions are needed to insure that the scabs are not disturbed over the next 2-4 weeks while the healing proceeds. ° °Because the raw surface inside your bladder and the irritating effects of urine you may expect frequency of urination and/or urgency (a stronger desire to urinate) and perhaps even getting up at night more often. This will usually resolve or improve slowly over the healing period. You may see some blood in your urine over the first 6 weeks. Do not be alarmed, even if the urine was clear for a while. Get off your feet and drink lots of fluids until clearing occurs. If you start to pass clots or don't improve call us. ° °Diet: ° °You may return to your normal diet immediately. Because of the raw surface of your bladder, alcohol, spicy foods, foods high in acid and drinks with caffeine may cause irritation or frequency and should be used in moderation. To keep your urine flowing freely and avoid constipation, drink plenty of fluids during the day (8-10 glasses). Tip: Avoid cranberry juice because it is very acidic. ° °Activity: ° °Your physical activity doesn't need to be restricted. However, if you are very active, you may see some blood in the urine. We suggest that you reduce your activity under the circumstances until the bleeding has stopped. ° °Bowels: ° °It is important to keep your bowels regular during the postoperative  period. Straining with bowel movements can cause bleeding. A bowel movement every other day is reasonable. Use a mild laxative if needed, such as milk of magnesia 2-3 tablespoons, or 2 Dulcolax tablets. Call if you continue to have problems. If you had been taking narcotics for pain, before, during or after your surgery, you may be constipated. Take a laxative if necessary. ° ° ° °Medication: ° °You should resume your pre-surgery medications unless told not to. In addition you may be given an antibiotic to prevent or treat infection. Antibiotics are not always necessary. All medication should be taken as prescribed until the bottles are finished unless you are having an unusual reaction to one of the drugs. ° ° °[facility] °[address], [address] °[place], [place] [address] °[phone] ° ° °AMBULATORY SURGERY  °DISCHARGE INSTRUCTIONS ° ° °1) The drugs that you were given will stay in your system until tomorrow so for the next 24 hours you should not: ° °A) Drive an automobile °B) Make any legal decisions °C) Drink any alcoholic beverage ° ° °2) You may resume regular meals tomorrow.  Today it is better to start with liquids and gradually work up to solid foods. ° °You may eat anything you prefer, but it is better to start with liquids, then soup and crackers, and gradually work up to solid foods. ° ° °3) Please notify your doctor immediately if you have any unusual bleeding, trouble breathing, redness and pain at the surgery site, drainage, fever, or pain not relieved by medication. ° ° ° °4)   Additional Instructions: ° ° ° ° ° ° ° °Please contact your physician with any problems or Same Day Surgery at [phone], Monday through Friday 6 am to 4 pm, or [facility] [facility] Main number at [phone]. ° ° ° ° °

## 2016-03-14 NOTE — H&P (View-Only) (Signed)
03/07/2016 6:32 AM   Tyler Henderson 04-10-1946 [id number]  Referring provider: Sofie Hartigan, MD [address], [place] [address]  No chief complaint on file.   HPI:  1. Bladder Cancer - blood on UA noted by PCP x several and at Urol office. Prior 30PY smoker. CT Urogram 02/2016 with 44mm renal stone / no worriseom masses. Cysto 02/2016 with about dime sized papillary tumor just lateral to Rt UO, mostly flat.   2. Simple renal cyst - Lt>Rt non-complex cysts without mass effect on CT urogram 02/2016. Dominant cyst LLP 7cm.   3. Nephrolithiasis  - 106mm stone Rt kidney non-obstructing by CT 02/2016. Several prior stones passed medically.   4. Chronic renal failure, stage 3  - GFR upper 40's by serum markers 2017. CT w/o hydro. He is diabetic and with CHF.  Today " Tyler Henderson " is seen for cysto to complete hematuria eval.    PMH: Past Medical History:  Diagnosis Date  . Anxiety   . Arthritis   . Depression   . Diabetes ([place])   . Hypertension   . Sleep apnea     Surgical History: Past Surgical History:  Procedure Laterality Date  . REPLACEMENT TOTAL KNEE Left 2012    Home Medications:    Medication List       Accurate as of 03/07/16  6:32 AM. Always use your most recent med list.          amLODipine 10 MG tablet Commonly known as:  NORVASC Take 1 tablet by mouth once a day   aspirin EC 81 MG tablet Take by mouth.   atenolol 25 MG tablet Commonly known as:  TENORMIN Take 1 tablet by mouth once daily   atorvastatin 40 MG tablet Commonly known as:  LIPITOR Take 1 tablet by mouth once a day   carvedilol 12.5 MG tablet Commonly known as:  COREG Take 1 tablet by mouth  twice a day   FISH OIL PO Take by mouth.   furosemide 20 MG tablet Commonly known as:  LASIX Take by mouth.   gabapentin 300 MG capsule Commonly known as:  NEURONTIN Take by mouth.   glipiZIDE 10 MG tablet Commonly known as:   GLUCOTROL Take 1 tablet by mouth once a day   lisinopril 40 MG tablet Commonly known as:  PRINIVIL,ZESTRIL Take 1 tablet by mouth once daily   metFORMIN 1000 MG tablet Commonly known as:  GLUCOPHAGE Take 1 tablet by mouth  twice a day with meals   MULTI-VITAMINS Tabs Take by mouth.   omeprazole 20 MG capsule Commonly known as:  PRILOSEC Take 1 capsule by mouth two times daily   pioglitazone 45 MG tablet Commonly known as:  ACTOS Take 1 tablet by mouth  daily   potassium chloride 10 MEQ tablet Commonly known as:  K-DUR Take 1 tablet by mouth once daily       Allergies: No Known Allergies  Family History: Family History  Problem Relation Age of Onset  . Cancer Mother     Breast  . Heart failure Father   . Prostate cancer Neg Hx   . Bladder Cancer Neg Hx     Social History:  reports that he quit smoking about 38 years ago. He has quit using smokeless tobacco. He reports that he drinks about 1.2 oz of alcohol per week . His drug history is not on file.   Review of Systems  Gastrointestinal (  upper)  : Negative for upper GI symptoms  Gastrointestinal (lower) : Negative for lower GI symptoms  Constitutional : Negative for symptoms  Skin: Negative for skin symptoms  Eyes: Negative for eye symptoms  Ear/Nose/Throat : Negative for Ear/Nose/Throat symptoms  Hematologic/Lymphatic: Negative for Hematologic/Lymphatic symptoms  Cardiovascular : Negative for cardiovascular symptoms  Respiratory : sleep apnea  Endocrine: Negative for endocrine symptoms  Musculoskeletal: Negative for musculoskeletal symptoms  Neurological: Negative for neurological symptoms  Psychologic: Negative for psychiatric symptoms   Physical Exam: There were no vitals taken for this visit.  Constitutional:  Alert and oriented, No acute distress. HEENT: [place] AT, moist mucus membranes.  Trachea midline, no masses. Cardiovascular: No clubbing, cyanosis, or edema. Respiratory:  Normal respiratory effort, no increased work of breathing. GI: Abdomen is soft, nontender, nondistended, no abdominal masses GU: No CVA tenderness.  Skin: No rashes, bruises or suspicious lesions. Lymph: No cervical or inguinal adenopathy. Neurologic: Grossly intact, no focal deficits, moving all 4 extremities. Psychiatric: Normal mood and affect.  Laboratory Data: No results found for: WBC, HGB, HCT, MCV, PLT  Lab Results  Component Value Date   CREATININE 1.63 (H) 02/23/2016    No results found for: PSA  No results found for: TESTOSTERONE  No results found for: HGBA1C  Urinalysis    Component Value Date/Time   APPEARANCEUR Clear 02/15/2016 1432   GLUCOSEU Negative 02/15/2016 1432   BILIRUBINUR Negative 02/15/2016 1432   PROTEINUR 2+ (A) 02/15/2016 1432   NITRITE Negative 02/15/2016 1432   LEUKOCYTESUR Negative 02/15/2016 1432       Cystoscopy Procedure Note  Patient identification was confirmed, informed consent was obtained, and patient was prepped using Betadine solution.  Lidocaine jelly was administered per urethral meatus.    Preoperative abx where received prior to procedure.     Pre-Procedure: - Inspection reveals a normal caliber ureteral meatus.  Procedure: The flexible cystoscope was introduced without difficulty - No urethral strictures/lesions are present. - Normal prostate  - Normal bladder neck - Bilateral ureteral orifices identified - No bladder stones - No trabeculation  Dime sized predominantly flat papillary tumor lateral to Rt UO noted.   Retroflexion shows no additional findints   Post-Procedure: - Patient tolerated the procedure well   Pertinent Imaging: as per above  Assessment & Plan:   1. Bladder Cancer  -  Likely small low grade tumor by cysto appearance, but do rec formal TURBT for histologic analysis. Given proximity to Rt UO, may need Rt retrograde / stent. As lesions does not involved UO, I do not feel ureteroscopy is  needed.   Risks, benefits, alternatives, peri-op Henderson, need for possible temporary foley post-op discussed. UA today normal.    2. Simple renal cyst  - no mass effect or enhancing nodules. No indication for further evaluation.   3. Nephrolithiasis - very small non-obstructing stone burden. Rec observation. THis would likely pass with medical therapy should it drop.   4. Chronic renal failure, stage 3 (moderate)  - medical renal disesae. Reinforced importance of BP and glycemic control.     Alexis Frock, [facility] [address], [place], [place] [address] [phone]

## 2016-03-14 NOTE — Interval H&P Note (Signed)
History and Physical Interval Note:  03/14/2016 1:19 PM  Tyler Henderson  has presented today for surgery, with the diagnosis of BLADDER TUMOR  The various methods of treatment have been discussed with the patient and family. After consideration of risks, benefits and other options for treatment, the patient has consented to  Procedure(s): CYSTOSCOPY WITH RETROGRADE PYELOGRAM (Right) TRANSURETHRAL RESECTION OF BLADDER TUMOR (TURBT) (N/A) CYSTOSCOPY WITH STENT PLACEMENT (Right) as a surgical intervention .  The patient's history has been reviewed, patient examined, no change in status, stable for surgery.  I have reviewed the patient's chart and labs.  Questions were answered to the patient's satisfaction.    RRR CTAB  Hollice Espy

## 2016-03-14 NOTE — Op Note (Signed)
Date of procedure: 03/14/16  Preoperative diagnosis:  1. Hematuria 2. Right lateral bladder wall lesion   Postoperative diagnosis:  1. Same as above   Procedure: 1. TURBT/small  Surgeon: Hollice Espy, MD  Anesthesia: General  Complications: None  Intraoperative findings: Proximally 1 cm area of carpeting of low-grade appearing papillary lesion on a slightly erythematous face, just be on right UO  EBL: Minimal  Specimens: Bladder tumor  Drains: None  Indication: Tyler Henderson is a 71 y.o. patient with hematuria who underwent further workup with CT urogram and cystoscopy in the office. He was noted to have a suspicious lesion just be on his right ureteral orifice concerning for possible malignancy..  After reviewing the management options for treatment, he elected to proceed with the above surgical procedure(s). We have discussed the potential benefits and risks of the procedure, side effects of the proposed treatment, the likelihood of the patient achieving the goals of the procedure, and any potential problems that might occur during the procedure or recuperation. Informed consent has been obtained.  Description of procedure:  The patient was taken to the operating room and general anesthesia was induced.  The patient was placed in the dorsal lithotomy position, prepped and draped in the usual sterile fashion, and preoperative antibiotics were administered. A preoperative time-out was performed.   A rigid 21 French cystoscope was advanced per urethra into the bladder. The bladder was carefully inspected which revealed orthotopic ureters on the trigone with clear reflux of urine from both. Just be on the right UO, there was approximately 1 cm area of diffuse very subtle papillary carpeting on a slightly erythematous base concerning for possible low-grade or early malignancy. Throughout the remainder of the bladder, there was no ulcerations, lesions, or stones appreciated. After  bladder filling and emptying, diffuse glomerulations were appreciated.    Cold cup biopsy forceps were used to piecewise resect the suspicious area. A total of 3 biopsies were taken with care taken to avoid any biopsy or fulguration around the UO itself. The UO did not appear to be involved and was a fair distance, at least 0.5 cm away from the suspicious area.  Next, Bugbee electrocautery was used to fulgurate the base of the biopsied lesion as well as around the periphery both for hemostasis and therapeutic reasons. Careful inspection of the ureter orifice revealed clear reflux of yellow urine following resection and fulguration. The bladder was then drained and the patient was cleaned and dried. He was repositioned the supine position, reversed from anesthesia, and taken to PACU in stable condition.  Plan: Given the appearance of the tumor, I suspect this will be a low-grade noninvasive tumor. As such, I will call patient with pathology results. We'll go ahead and schedule cystoscopy for in 3 months and revise his follow-up plan as needed based on pathology report.  Hollice Espy, M.D.

## 2016-03-14 NOTE — Anesthesia Postprocedure Evaluation (Signed)
Anesthesia Post Note  Patient: Tyler Henderson  Procedure(s) Performed: Procedure(s) (LRB): TRANSURETHRAL RESECTION OF BLADDER TUMOR (TURBT) (N/A)  Patient location during evaluation: PACU Anesthesia Type: General Level of consciousness: awake and alert Pain management: pain level controlled Vital Signs Assessment: post-procedure vital signs reviewed and stable Respiratory status: spontaneous breathing, nonlabored ventilation, respiratory function stable and patient connected to nasal cannula oxygen Cardiovascular status: blood pressure returned to baseline and stable Postop Assessment: no signs of nausea or vomiting Anesthetic complications: no    Last Vitals:  Vitals:   03/14/16 1505 03/14/16 1518  BP: (!) 175/66 (!) 174/67  Pulse: 71 64  Resp: 17 16  Temp: 36.7 C     Last Pain:  Vitals:   03/14/16 1413  TempSrc: Temporal                 Martha Clan

## 2016-03-15 ENCOUNTER — Encounter: Payer: Self-pay | Admitting: Urology

## 2016-03-16 LAB — SURGICAL PATHOLOGY

## 2016-03-21 ENCOUNTER — Telehealth: Payer: Self-pay | Admitting: Urology

## 2016-03-21 NOTE — Telephone Encounter (Signed)
Patient is calling requesting his pathology results.   Thanks,  michelle

## 2016-03-22 NOTE — Telephone Encounter (Signed)
Contacted patient with pathology results, pT1a, low grade.  Natural history of bladder cancer discussed.  Recommend follow up with cystoscopy in 3 months as previously planned.  All questions were answered.    Hollice Espy, MD

## 2016-06-15 ENCOUNTER — Ambulatory Visit (INDEPENDENT_AMBULATORY_CARE_PROVIDER_SITE_OTHER): Payer: Medicare Other | Admitting: Urology

## 2016-06-15 VITALS — BP 224/114 | HR 80 | Ht 73.0 in | Wt 322.0 lb

## 2016-06-15 DIAGNOSIS — I10 Essential (primary) hypertension: Secondary | ICD-10-CM

## 2016-06-15 DIAGNOSIS — C679 Malignant neoplasm of bladder, unspecified: Secondary | ICD-10-CM | POA: Diagnosis not present

## 2016-06-15 LAB — MICROSCOPIC EXAMINATION: Bacteria, UA: NONE SEEN

## 2016-06-15 LAB — URINALYSIS, COMPLETE
BILIRUBIN UA: NEGATIVE
Glucose, UA: NEGATIVE
KETONES UA: NEGATIVE
Leukocytes, UA: NEGATIVE
NITRITE UA: NEGATIVE
SPEC GRAV UA: 1.02 (ref 1.005–1.030)
Urobilinogen, Ur: 0.2 mg/dL (ref 0.2–1.0)
pH, UA: 6 (ref 5.0–7.5)

## 2016-06-15 MED ORDER — LIDOCAINE HCL 2 % EX GEL
1.0000 "application " | Freq: Once | CUTANEOUS | Status: AC
  Administered 2016-06-15: 1 via URETHRAL

## 2016-06-15 MED ORDER — CIPROFLOXACIN HCL 500 MG PO TABS
500.0000 mg | ORAL_TABLET | Freq: Once | ORAL | Status: AC
  Administered 2016-06-15: 500 mg via ORAL

## 2016-06-15 NOTE — Progress Notes (Signed)
   06/15/16  CC:  Chief Complaint  Patient presents with  . Cysto    HPI: 70 year old male with microscopic hematuria found to have a 1 cm area of low-grade appearing papillary tumor just beyond the right UO. He underwent TURBT on 03/14/2016 with pathology consistent with low-grade TA TCC. He returns today for office cystoscopy.  Most recent upper tract imaging on 02/23/2016 in the form of CT urogram.  This showed a 2 mm right interpolar nonobstructing stone, otherwise unremarkable.  Blood pressure (!) 224/114, pulse 80, height 6\' 1"  (1.854 m), weight (!) 322 lb (146.1 kg). NED. A&Ox3.   No respiratory distress   Abd soft, NT, ND Normal phallus with bilateral descended testicles  Cystoscopy Procedure Note  Patient identification was confirmed, informed consent was obtained, and patient was prepped using Betadine solution.  Lidocaine jelly was administered per urethral meatus.    Preoperative abx where received prior to procedure.     Pre-Procedure: - Inspection reveals a normal caliber ureteral meatus.  Procedure: The flexible cystoscope was introduced without difficulty - No urethral strictures/lesions are present. - Normal prostate  - Elevated bladder neck, mildly - Bilateral ureteral orifices identified - Bladder mucosa  reveals no ulcers, tumors, or lesions.  Stellate scar appreciated just beyond right UO. - No bladder stones - No trabeculation  Retroflexion unremarkable.   Post-Procedure: - Patient tolerated the procedure well  Assessment/ Plan:  1. 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 - lidocaine (XYLOCAINE) 2 % jelly 1 application; Place 1 application into the urethra  once.  2. Hypertension, unspecified type Incidentally significantly hypertensive today, asymptomatic Patient reports known history of white coat hypertension He will recheck his blood pressure once he gets home today and call his PCP if not improved  Hollice Espy, MD

## 2016-12-14 ENCOUNTER — Other Ambulatory Visit: Payer: Medicare Other | Admitting: Urology

## 2017-06-28 ENCOUNTER — Other Ambulatory Visit: Payer: Self-pay | Admitting: Family Medicine

## 2017-06-28 ENCOUNTER — Other Ambulatory Visit: Payer: Self-pay | Admitting: Nephrology

## 2017-06-28 DIAGNOSIS — N281 Cyst of kidney, acquired: Secondary | ICD-10-CM

## 2017-07-05 ENCOUNTER — Ambulatory Visit: Payer: Medicare Other

## 2017-07-14 ENCOUNTER — Ambulatory Visit
Admission: RE | Admit: 2017-07-14 | Discharge: 2017-07-14 | Disposition: A | Discharge status: Home / Self Care | Payer: Medicare Other | Source: Ambulatory Visit | Attending: Nephrology | Admitting: Nephrology

## 2017-07-14 DIAGNOSIS — K5730 Diverticulosis of large intestine without perforation or abscess without bleeding: Secondary | ICD-10-CM | POA: Diagnosis not present

## 2017-07-14 DIAGNOSIS — I700 Atherosclerosis of aorta: Secondary | ICD-10-CM | POA: Diagnosis not present

## 2017-07-14 DIAGNOSIS — N281 Cyst of kidney, acquired: Secondary | ICD-10-CM | POA: Insufficient documentation

## 2017-07-14 DIAGNOSIS — K769 Liver disease, unspecified: Secondary | ICD-10-CM | POA: Insufficient documentation

## 2017-07-19 ENCOUNTER — Ambulatory Visit: Payer: Medicare Other

## 2017-09-13 ENCOUNTER — Other Ambulatory Visit: Payer: Self-pay | Admitting: Nephrology

## 2017-09-13 ENCOUNTER — Other Ambulatory Visit
Admission: RE | Admit: 2017-09-13 | Discharge: 2017-09-13 | Disposition: A | Discharge status: Home / Self Care | Payer: Medicare Other | Source: Ambulatory Visit | Attending: Nephrology | Admitting: Nephrology

## 2017-09-13 DIAGNOSIS — I129 Hypertensive chronic kidney disease with stage 1 through stage 4 chronic kidney disease, or unspecified chronic kidney disease: Secondary | ICD-10-CM | POA: Diagnosis present

## 2017-09-13 DIAGNOSIS — N184 Chronic kidney disease, stage 4 (severe): Secondary | ICD-10-CM | POA: Insufficient documentation

## 2017-09-13 DIAGNOSIS — R160 Hepatomegaly, not elsewhere classified: Secondary | ICD-10-CM

## 2017-09-13 DIAGNOSIS — G4730 Sleep apnea, unspecified: Secondary | ICD-10-CM | POA: Diagnosis not present

## 2017-09-13 DIAGNOSIS — E1122 Type 2 diabetes mellitus with diabetic chronic kidney disease: Secondary | ICD-10-CM | POA: Diagnosis not present

## 2017-09-13 DIAGNOSIS — Z96652 Presence of left artificial knee joint: Secondary | ICD-10-CM | POA: Insufficient documentation

## 2017-09-13 DIAGNOSIS — K219 Gastro-esophageal reflux disease without esophagitis: Secondary | ICD-10-CM | POA: Insufficient documentation

## 2017-09-13 DIAGNOSIS — C679 Malignant neoplasm of bladder, unspecified: Secondary | ICD-10-CM | POA: Diagnosis not present

## 2017-09-13 LAB — COMPREHENSIVE METABOLIC PANEL
ALK PHOS: 179 U/L — AB (ref 38–126)
ALT: 24 U/L (ref 17–63)
AST: 26 U/L (ref 15–41)
Albumin: 3 g/dL — ABNORMAL LOW (ref 3.5–5.0)
Anion gap: 7 (ref 5–15)
BILIRUBIN TOTAL: 0.3 mg/dL (ref 0.3–1.2)
BUN: 33 mg/dL — AB (ref 6–20)
CO2: 23 mmol/L (ref 22–32)
CREATININE: 1.8 mg/dL — AB (ref 0.61–1.24)
Calcium: 8.6 mg/dL — ABNORMAL LOW (ref 8.9–10.3)
Chloride: 104 mmol/L (ref 101–111)
GFR calc Af Amer: 41 mL/min — ABNORMAL LOW (ref >60)
GFR calc non Af Amer: 36 mL/min — ABNORMAL LOW (ref >60)
GLUCOSE: 224 mg/dL — AB (ref 65–99)
Potassium: 5 mmol/L (ref 3.5–5.1)
Sodium: 134 mmol/L — ABNORMAL LOW (ref 135–145)
TOTAL PROTEIN: 6.8 g/dL (ref 6.5–8.1)

## 2017-09-14 LAB — AFP TUMOR MARKER: AFP, Serum, Tumor Marker: 2.2 ng/mL (ref 0.0–8.3)

## 2017-09-14 LAB — CA 19-9 (SERIAL): CAN 19-9: 27 U/mL (ref 0–35)

## 2017-09-14 LAB — CEA: CEA1: 2.6 ng/mL (ref 0.0–4.7)

## 2017-09-15 ENCOUNTER — Encounter
Admission: RE | Admit: 2017-09-15 | Discharge: 2017-09-15 | Disposition: A | Discharge status: Home / Self Care | Payer: Medicare Other | Source: Ambulatory Visit | Attending: Nephrology | Admitting: Nephrology

## 2017-09-15 ENCOUNTER — Inpatient Hospital Stay: Payer: Medicare Other | Attending: Oncology | Admitting: Oncology

## 2017-09-15 ENCOUNTER — Other Ambulatory Visit: Payer: Self-pay | Admitting: Radiology

## 2017-09-15 ENCOUNTER — Encounter: Payer: Self-pay | Admitting: Oncology

## 2017-09-15 ENCOUNTER — Other Ambulatory Visit: Payer: Self-pay

## 2017-09-15 VITALS — BP 157/100 | HR 99 | Temp 98.7°F | Resp 18 | Wt 326.3 lb

## 2017-09-15 DIAGNOSIS — K5900 Constipation, unspecified: Secondary | ICD-10-CM | POA: Insufficient documentation

## 2017-09-15 DIAGNOSIS — N2889 Other specified disorders of kidney and ureter: Secondary | ICD-10-CM | POA: Diagnosis present

## 2017-09-15 DIAGNOSIS — K769 Liver disease, unspecified: Secondary | ICD-10-CM | POA: Diagnosis not present

## 2017-09-15 DIAGNOSIS — I493 Ventricular premature depolarization: Secondary | ICD-10-CM | POA: Diagnosis not present

## 2017-09-15 DIAGNOSIS — F419 Anxiety disorder, unspecified: Secondary | ICD-10-CM | POA: Insufficient documentation

## 2017-09-15 DIAGNOSIS — R160 Hepatomegaly, not elsewhere classified: Secondary | ICD-10-CM

## 2017-09-15 DIAGNOSIS — I4891 Unspecified atrial fibrillation: Secondary | ICD-10-CM | POA: Diagnosis not present

## 2017-09-15 DIAGNOSIS — I4510 Unspecified right bundle-branch block: Secondary | ICD-10-CM | POA: Diagnosis not present

## 2017-09-15 DIAGNOSIS — Z79899 Other long term (current) drug therapy: Secondary | ICD-10-CM | POA: Diagnosis not present

## 2017-09-15 DIAGNOSIS — K921 Melena: Secondary | ICD-10-CM

## 2017-09-15 DIAGNOSIS — Z7982 Long term (current) use of aspirin: Secondary | ICD-10-CM | POA: Diagnosis not present

## 2017-09-15 LAB — DIFFERENTIAL
Basophils Absolute: 0 10*3/uL (ref 0–0.1)
Basophils Relative: 1 %
EOS PCT: 1 %
Eosinophils Absolute: 0.1 10*3/uL (ref 0–0.7)
LYMPHS ABS: 1 10*3/uL (ref 1.0–3.6)
LYMPHS PCT: 12 %
MONO ABS: 0.7 10*3/uL (ref 0.2–1.0)
Monocytes Relative: 9 %
NEUTROS ABS: 6.2 10*3/uL (ref 1.4–6.5)
Neutrophils Relative %: 77 %

## 2017-09-15 LAB — COMPREHENSIVE METABOLIC PANEL
ALBUMIN: 3.2 g/dL — AB (ref 3.5–5.0)
ALK PHOS: 181 U/L — AB (ref 38–126)
ALT: 25 U/L (ref 17–63)
AST: 27 U/L (ref 15–41)
Anion gap: 11 (ref 5–15)
BUN: 32 mg/dL — ABNORMAL HIGH (ref 6–20)
CALCIUM: 9 mg/dL (ref 8.9–10.3)
CHLORIDE: 106 mmol/L (ref 101–111)
CO2: 21 mmol/L — AB (ref 22–32)
CREATININE: 2.27 mg/dL — AB (ref 0.61–1.24)
GFR calc Af Amer: 31 mL/min — ABNORMAL LOW (ref >60)
GFR calc non Af Amer: 27 mL/min — ABNORMAL LOW (ref >60)
Glucose, Bld: 211 mg/dL — ABNORMAL HIGH (ref 65–99)
Potassium: 4.9 mmol/L (ref 3.5–5.1)
SODIUM: 138 mmol/L (ref 135–145)
Total Bilirubin: 0.6 mg/dL (ref 0.3–1.2)
Total Protein: 6.9 g/dL (ref 6.5–8.1)

## 2017-09-15 LAB — CBC
HCT: 30.6 % — ABNORMAL LOW (ref 40.0–52.0)
HEMOGLOBIN: 10 g/dL — AB (ref 13.0–18.0)
MCH: 29.1 pg (ref 26.0–34.0)
MCHC: 32.7 g/dL (ref 32.0–36.0)
MCV: 88.8 fL (ref 80.0–100.0)
PLATELETS: 291 10*3/uL (ref 150–440)
RBC: 3.44 MIL/uL — AB (ref 4.40–5.90)
RDW: 12.5 % (ref 11.5–14.5)
WBC: 8 10*3/uL (ref 3.8–10.6)

## 2017-09-15 LAB — PROTIME-INR
INR: 1.16
PROTHROMBIN TIME: 14.7 s (ref 11.4–15.2)

## 2017-09-15 LAB — URINALYSIS, ROUTINE W REFLEX MICROSCOPIC
BACTERIA UA: NONE SEEN
BILIRUBIN URINE: NEGATIVE
Glucose, UA: NEGATIVE mg/dL
HGB URINE DIPSTICK: NEGATIVE
KETONES UR: NEGATIVE mg/dL
Leukocytes, UA: NEGATIVE
NITRITE: NEGATIVE
PH: 5 (ref 5.0–8.0)
Protein, ur: 100 mg/dL — AB
SPECIFIC GRAVITY, URINE: 1.012 (ref 1.005–1.030)
SQUAMOUS EPITHELIAL / LPF: NONE SEEN

## 2017-09-15 LAB — TYPE AND SCREEN
ABO/RH(D): B POS
Antibody Screen: NEGATIVE

## 2017-09-15 MED ORDER — ALPRAZOLAM 0.25 MG PO TABS: 0.2500 mg | ORAL_TABLET | Freq: Every evening | ORAL | 0 refills | Status: DC | PRN

## 2017-09-15 NOTE — Progress Notes (Signed)
[place]  Telephone:(336) [phone] Fax:(336) [phone]  ID: Rolene Course OB: October 16, 1945  MR#: [id number]  JQZ#:[id number]  Patient Care Team: Sofie Hartigan, MD as PCP - General (Family Medicine)  CHIEF COMPLAINT: Liver masses, highly suspicious for metastatic disease.  INTERVAL HISTORY: Patient is a 72 year old male who had a CT scan of the abdomen and pelvis to evaluate his kidneys and was noted to have multiple liver masses highly suspicious for underlying metastatic disease.  Patient complains of constipation, but otherwise feels well.  He has no neurologic complaints.  He denies any recent fevers or illnesses.  He has a good appetite and denies weight loss.  He has no chest pain or shortness of breath.  He denies any nausea, vomiting, constipation, or diarrhea.  He has occasional melena, but denies any hematochezia.  He has no urinary complaints.  Patient offers no specific complaints today.  REVIEW OF SYSTEMS:   Review of Systems  Constitutional: Negative.  Negative for fever, malaise/fatigue and weight loss.  Respiratory: Negative.  Negative for cough, hemoptysis and shortness of breath.   Cardiovascular: Negative.  Negative for chest pain and leg swelling.  Gastrointestinal: Positive for melena. Negative for abdominal pain and blood in stool.  Genitourinary: Negative.  Negative for dysuria.  Musculoskeletal: Positive for joint pain.  Skin: Negative.  Negative for rash.  Neurological: Negative.  Negative for sensory change and weakness.  Psychiatric/Behavioral: The patient is nervous/anxious.     As per HPI. Otherwise, a complete review of systems is negative.  PAST MEDICAL HISTORY: Past Medical History:  Diagnosis Date  . Anxiety   . Arthritis   . Bladder cancer ([place])   . Chronic kidney disease   . Depression   . Diabetes ([place])   . GERD (gastroesophageal reflux disease)   . Hypertension   . Restless leg syndrome   . Shortness of breath  dyspnea    with exertion  . Skin cancer   . Sleep apnea     PAST SURGICAL HISTORY: Past Surgical History:  Procedure Laterality Date  . APPENDECTOMY    . JOINT REPLACEMENT Left    Total Knee Replacement  . KNEE ARTHROSCOPY Left   . REPLACEMENT TOTAL KNEE Left 2012   Dr. Little [facility], Jr. [facility]  . TRANSURETHRAL RESECTION OF BLADDER TUMOR N/A 03/14/2016   Procedure: TRANSURETHRAL RESECTION OF BLADDER TUMOR (TURBT);  Surgeon: Hollice Espy, MD;  Location: [facility] ORS;  Service: Urology;  Laterality: N/A;    FAMILY HISTORY: Family History  Problem Relation Age of Onset  . Cancer Mother        Breast  . Heart failure Father   . Prostate cancer Neg Hx   . Bladder Cancer Neg Hx     ADVANCED DIRECTIVES (Y/N):  N  HEALTH MAINTENANCE: Social History   Tobacco Use  . Smoking status: Former Smoker    Packs/day: 1.00    Years: 20.00    Pack years: 20.00    Types: Cigarettes    Last attempt to quit: 1979    Years since quitting: 40.1  . Smokeless tobacco: Never Used  Substance Use Topics  . Alcohol use: Yes    Alcohol/week: 1.2 oz    Types: 2 Cans of beer per week    Comment: occassional  . Drug use: No     Colonoscopy:  PAP:  Bone density:  Lipid panel:  No Known Allergies  Current Outpatient Medications  Medication Sig Dispense Refill  . amLODipine (NORVASC) 5 MG  tablet Take 1 tablet by mouth once a day--bedtime    . atorvastatin (LIPITOR) 40 MG tablet Take 1 tablet by mouth once a day    . docusate sodium (COLACE) 100 MG capsule Take 1 capsule (100 mg total) by mouth 2 (two) times daily. 60 capsule 0  . ferrous sulfate (SLOW RELEASE IRON) 160 (50 Fe) MG TBCR SR tablet Take 160 mg by mouth 2 (two) times daily.     Marland Kitchen gabapentin (NEURONTIN) 300 MG capsule Take 300 mg by mouth at bedtime.     Marland Kitchen glipiZIDE (GLUCOTROL) 10 MG tablet Take 1 tablet by mouth once a night    . lisinopril (PRINIVIL,ZESTRIL) 40 MG tablet Take 1 tablet by mouth once daily    . Multiple Vitamin  (MULTI-VITAMINS) TABS Take 1 tablet by mouth daily. Alive Mens Multivitamin    . Omega-3 Fatty Acids (FISH OIL PO) Take 1 capsule by mouth 2 (two) times daily.     Marland Kitchen omeprazole (PRILOSEC) 20 MG capsule once daily    . pioglitazone (ACTOS) 45 MG tablet Take 1 tablet by mouth morning    . ALPRAZolam (XANAX) 0.25 MG tablet Take 1 tablet (0.25 mg total) by mouth at bedtime as needed for anxiety. 30 tablet 0  . aspirin EC 81 MG tablet Take 81 mg by mouth daily.     . carvedilol (COREG) 12.5 MG tablet Take 1 tablet by mouth  twice a day    . furosemide (LASIX) 20 MG tablet Take 20-60 mg by mouth daily as needed for fluid or edema.      No current facility-administered medications for this visit.     OBJECTIVE: Vitals:   09/15/17 0903  BP: (!) 157/100  Pulse: 99  Resp: 18  Temp: 98.7 F (37.1 C)     Body mass index is 43.05 kg/m.    ECOG FS:0 - Asymptomatic  General: Well-developed, well-nourished, no acute distress. Eyes: Pink conjunctiva, anicteric sclera. HEENT: Normocephalic, moist mucous membranes, clear oropharnyx. Lungs: Clear to auscultation bilaterally. Heart: Regular rate and rhythm. No rubs, murmurs, or gallops. Abdomen: Soft, nontender, nondistended. No organomegaly noted, normoactive bowel sounds. Musculoskeletal: No edema, cyanosis, or clubbing. Neuro: Alert, answering all questions appropriately. Cranial nerves grossly intact. Skin: No rashes or petechiae noted. Psych: Normal affect. Lymphatics: No cervical, calvicular, axillary or inguinal LAD.   LAB RESULTS:  Lab Results  Component Value Date   NA 134 (L) 09/13/2017   K 5.0 09/13/2017   CL 104 09/13/2017   CO2 23 09/13/2017   GLUCOSE 224 (H) 09/13/2017   BUN 33 (H) 09/13/2017   CREATININE 1.80 (H) 09/13/2017   CALCIUM 8.6 (L) 09/13/2017   PROT 6.8 09/13/2017   ALBUMIN 3.0 (L) 09/13/2017   AST 26 09/13/2017   ALT 24 09/13/2017   ALKPHOS 179 (H) 09/13/2017   BILITOT 0.3 09/13/2017   GFRNONAA 36 (L)  09/13/2017   GFRAA 41 (L) 09/13/2017    Lab Results  Component Value Date   WBC 7.6 03/09/2016   HGB 12.0 (L) 03/09/2016   HCT 35.1 (L) 03/09/2016   MCV 90.3 03/09/2016   PLT 217 03/09/2016     STUDIES: No results found.  ASSESSMENT: Liver masses, highly suspicious for metastatic disease.  PLAN:    1. Liver masses, highly suspicious for metastatic disease: CT scan results reviewed independently.  CEA, CA-19-9, and AFP are all within normal limits.  Patient has an ultrasound-guided biopsy of his liver scheduled for Monday.  We will get a PET  scan for further evaluation as well as to get imaging of his lungs.  Patient will return to clinic in 1 week for further evaluation, discussion of his results, and treatment planning if necessary. 2.  Anxiety: Patient was given a prescription for Xanax today. 3.  Melena: Consider GI referral in the near future.  Approximately 60 minutes was spent in discussion of which greater than 50% was consultation.  Patient expressed understanding and was in agreement with this plan. He also understands that He can call clinic at any time with any questions, concerns, or complaints.   Cancer Staging No matching staging information was found for the patient.  Lloyd Huger, MD   09/15/2017 1:08 PM

## 2017-09-17 DIAGNOSIS — R160 Hepatomegaly, not elsewhere classified: Secondary | ICD-10-CM | POA: Insufficient documentation

## 2017-09-17 NOTE — Progress Notes (Deleted)
[place]  Telephone:(336) [phone] Fax:(336) [phone]  ID: Rolene Course OB: 10-10-1945  MR#: [id number]  KKX#:[id number]  Patient Care Team: Sofie Hartigan, MD as PCP - General (Family Medicine)  CHIEF COMPLAINT: Liver masses, highly suspicious for metastatic disease.  INTERVAL HISTORY: Patient is a 72 year old male who had a CT scan of the abdomen and pelvis to evaluate his kidneys and was noted to have multiple liver masses highly suspicious for underlying metastatic disease.  Patient complains of constipation, but otherwise feels well.  He has no neurologic complaints.  He denies any recent fevers or illnesses.  He has a good appetite and denies weight loss.  He has no chest pain or shortness of breath.  He denies any nausea, vomiting, constipation, or diarrhea.  He has occasional melena, but denies any hematochezia.  He has no urinary complaints.  Patient offers no specific complaints today.  REVIEW OF SYSTEMS:   Review of Systems  Constitutional: Negative.  Negative for fever, malaise/fatigue and weight loss.  Respiratory: Negative.  Negative for cough, hemoptysis and shortness of breath.   Cardiovascular: Negative.  Negative for chest pain and leg swelling.  Gastrointestinal: Positive for melena. Negative for abdominal pain and blood in stool.  Genitourinary: Negative.  Negative for dysuria.  Musculoskeletal: Positive for joint pain.  Skin: Negative.  Negative for rash.  Neurological: Negative.  Negative for sensory change and weakness.  Psychiatric/Behavioral: The patient is nervous/anxious.     As per HPI. Otherwise, a complete review of systems is negative.  PAST MEDICAL HISTORY: Past Medical History:  Diagnosis Date  . Anxiety   . Arthritis   . Bladder cancer ([place])   . Chronic kidney disease   . Depression   . Diabetes ([place])   . GERD (gastroesophageal reflux disease)   . Hypertension   . Restless leg syndrome   . Shortness of breath  dyspnea    with exertion  . Skin cancer   . Sleep apnea     PAST SURGICAL HISTORY: Past Surgical History:  Procedure Laterality Date  . APPENDECTOMY    . JOINT REPLACEMENT Left    Total Knee Replacement  . KNEE ARTHROSCOPY Left   . REPLACEMENT TOTAL KNEE Left 2012   Dr. Little [facility]ikawa, Jr. [facility]  . TRANSURETHRAL RESECTION OF BLADDER TUMOR N/A 03/14/2016   Procedure: TRANSURETHRAL RESECTION OF BLADDER TUMOR (TURBT);  Surgeon: Hollice Espy, MD;  Location: [facility] ORS;  Service: Urology;  Laterality: N/A;    FAMILY HISTORY: Family History  Problem Relation Age of Onset  . Cancer Mother        Breast  . Heart failure Father   . Prostate cancer Neg Hx   . Bladder Cancer Neg Hx     ADVANCED DIRECTIVES (Y/N):  N  HEALTH MAINTENANCE: Social History   Tobacco Use  . Smoking status: Former Smoker    Packs/day: 1.00    Years: 20.00    Pack years: 20.00    Types: Cigarettes    Last attempt to quit: 1979    Years since quitting: 40.1  . Smokeless tobacco: Never Used  Substance Use Topics  . Alcohol use: Yes    Alcohol/week: 1.2 oz    Types: 2 Cans of beer per week    Comment: occassional  . Drug use: No     Colonoscopy:  PAP:  Bone density:  Lipid panel:  No Known Allergies  Current Outpatient Medications  Medication Sig Dispense Refill  . ALPRAZolam (XANAX) 0.25 MG  tablet Take 1 tablet (0.25 mg total) by mouth at bedtime as needed for anxiety. 30 tablet 0  . amLODipine (NORVASC) 5 MG tablet Take 1 tablet by mouth once a day--bedtime    . aspirin EC 81 MG tablet Take 81 mg by mouth daily.     Marland Kitchen atorvastatin (LIPITOR) 40 MG tablet Take 1 tablet by mouth once a day    . carvedilol (COREG) 12.5 MG tablet Take 1 tablet by mouth  twice a day    . docusate sodium (COLACE) 100 MG capsule Take 1 capsule (100 mg total) by mouth 2 (two) times daily. 60 capsule 0  . ferrous sulfate (SLOW RELEASE IRON) 160 (50 Fe) MG TBCR SR tablet Take 160 mg by mouth 2 (two) times daily.     .  furosemide (LASIX) 20 MG tablet Take 20-60 mg by mouth daily as needed for fluid or edema.     . gabapentin (NEURONTIN) 300 MG capsule Take 300 mg by mouth at bedtime.     Marland Kitchen glipiZIDE (GLUCOTROL) 10 MG tablet Take 1 tablet by mouth once a night    . lisinopril (PRINIVIL,ZESTRIL) 40 MG tablet Take 1 tablet by mouth once daily    . Multiple Vitamin (MULTI-VITAMINS) TABS Take 1 tablet by mouth daily. Alive Mens Multivitamin    . Omega-3 Fatty Acids (FISH OIL PO) Take 1 capsule by mouth 2 (two) times daily.     Marland Kitchen omeprazole (PRILOSEC) 20 MG capsule once daily    . pioglitazone (ACTOS) 45 MG tablet Take 1 tablet by mouth morning     No current facility-administered medications for this visit.     OBJECTIVE: There were no vitals filed for this visit.   There is no height or weight on file to calculate BMI.    ECOG FS:0 - Asymptomatic  General: Well-developed, well-nourished, no acute distress. Eyes: Pink conjunctiva, anicteric sclera. HEENT: Normocephalic, moist mucous membranes, clear oropharnyx. Lungs: Clear to auscultation bilaterally. Heart: Regular rate and rhythm. No rubs, murmurs, or gallops. Abdomen: Soft, nontender, nondistended. No organomegaly noted, normoactive bowel sounds. Musculoskeletal: No edema, cyanosis, or clubbing. Neuro: Alert, answering all questions appropriately. Cranial nerves grossly intact. Skin: No rashes or petechiae noted. Psych: Normal affect. Lymphatics: No cervical, calvicular, axillary or inguinal LAD.   LAB RESULTS:  Lab Results  Component Value Date   NA 138 09/15/2017   K 4.9 09/15/2017   CL 106 09/15/2017   CO2 21 (L) 09/15/2017   GLUCOSE 211 (H) 09/15/2017   BUN 32 (H) 09/15/2017   CREATININE 2.27 (H) 09/15/2017   CALCIUM 9.0 09/15/2017   PROT 6.9 09/15/2017   ALBUMIN 3.2 (L) 09/15/2017   AST 27 09/15/2017   ALT 25 09/15/2017   ALKPHOS 181 (H) 09/15/2017   BILITOT 0.6 09/15/2017   GFRNONAA 27 (L) 09/15/2017   GFRAA 31 (L) 09/15/2017     Lab Results  Component Value Date   WBC 8.0 09/15/2017   NEUTROABS 6.2 09/15/2017   HGB 10.0 (L) 09/15/2017   HCT 30.6 (L) 09/15/2017   MCV 88.8 09/15/2017   PLT 291 09/15/2017     STUDIES: No results found.  ASSESSMENT: Liver masses, highly suspicious for metastatic disease.  PLAN:    1. Liver masses, highly suspicious for metastatic disease: CT scan results reviewed independently.  CEA, CA-19-9, and AFP are all within normal limits.  Patient has an ultrasound-guided biopsy of his liver scheduled for Monday.  We will get a PET scan for further evaluation as well  as to get imaging of his lungs.  Patient will return to clinic in 1 week for further evaluation, discussion of his results, and treatment planning if necessary. 2.  Anxiety: Patient was given a prescription for Xanax today. 3.  Melena: Consider GI referral in the near future.  Approximately 60 minutes was spent in discussion of which greater than 50% was consultation.  Patient expressed understanding and was in agreement with this plan. He also understands that He can call clinic at any time with any questions, concerns, or complaints.   Cancer Staging No matching staging information was found for the patient.  Lloyd Huger, MD   09/17/2017 10:21 AM

## 2017-09-18 ENCOUNTER — Other Ambulatory Visit: Payer: Self-pay

## 2017-09-18 ENCOUNTER — Ambulatory Visit
Admission: RE | Admit: 2017-09-18 | Discharge: 2017-09-18 | Disposition: A | Discharge status: Home / Self Care | Payer: Medicare Other | Source: Ambulatory Visit | Attending: Nephrology | Admitting: Nephrology

## 2017-09-18 DIAGNOSIS — N2889 Other specified disorders of kidney and ureter: Secondary | ICD-10-CM | POA: Diagnosis not present

## 2017-09-18 DIAGNOSIS — I4510 Unspecified right bundle-branch block: Secondary | ICD-10-CM | POA: Insufficient documentation

## 2017-09-18 DIAGNOSIS — Z79899 Other long term (current) drug therapy: Secondary | ICD-10-CM | POA: Insufficient documentation

## 2017-09-18 DIAGNOSIS — I4891 Unspecified atrial fibrillation: Secondary | ICD-10-CM | POA: Insufficient documentation

## 2017-09-18 DIAGNOSIS — R160 Hepatomegaly, not elsewhere classified: Secondary | ICD-10-CM

## 2017-09-18 DIAGNOSIS — I493 Ventricular premature depolarization: Secondary | ICD-10-CM | POA: Insufficient documentation

## 2017-09-18 DIAGNOSIS — Z7982 Long term (current) use of aspirin: Secondary | ICD-10-CM | POA: Insufficient documentation

## 2017-09-18 LAB — GLUCOSE, CAPILLARY: Glucose-Capillary: 205 mg/dL — ABNORMAL HIGH (ref 65–99)

## 2017-09-18 MED ORDER — FENTANYL CITRATE (PF) 100 MCG/2ML IJ SOLN
INTRAMUSCULAR | Status: AC
  Filled 2017-09-18: qty 4

## 2017-09-18 MED ORDER — SODIUM CHLORIDE 0.9 % IV SOLN: INTRAVENOUS | Status: DC

## 2017-09-18 MED ORDER — MIDAZOLAM HCL 5 MG/5ML IJ SOLN
INTRAMUSCULAR | Status: AC
  Filled 2017-09-18: qty 5

## 2017-09-18 NOTE — Progress Notes (Signed)
BP elevated x 3 upon arrival.  MD notified.  Resolved prior to leaving.  Monitor shows A. Fib. Dr. Annamaria Boots notified.  EKG done.  Procedure canceled. Dr. Fath-cardiologist- notified and patient sent to office for evaluation.

## 2017-09-19 ENCOUNTER — Encounter: Admission: RE | Admit: 2017-09-19 | Payer: Medicare Other | Source: Ambulatory Visit

## 2017-09-21 ENCOUNTER — Encounter
Admission: RE | Admit: 2017-09-21 | Discharge: 2017-09-21 | Disposition: A | Discharge status: Home / Self Care | Payer: Medicare Other | Source: Ambulatory Visit | Attending: Oncology | Admitting: Oncology

## 2017-09-21 DIAGNOSIS — J90 Pleural effusion, not elsewhere classified: Secondary | ICD-10-CM | POA: Diagnosis not present

## 2017-09-21 DIAGNOSIS — R739 Hyperglycemia, unspecified: Secondary | ICD-10-CM | POA: Diagnosis not present

## 2017-09-21 DIAGNOSIS — I517 Cardiomegaly: Secondary | ICD-10-CM | POA: Insufficient documentation

## 2017-09-21 DIAGNOSIS — R918 Other nonspecific abnormal finding of lung field: Secondary | ICD-10-CM | POA: Diagnosis not present

## 2017-09-21 DIAGNOSIS — I700 Atherosclerosis of aorta: Secondary | ICD-10-CM | POA: Insufficient documentation

## 2017-09-21 DIAGNOSIS — R160 Hepatomegaly, not elsewhere classified: Secondary | ICD-10-CM | POA: Diagnosis not present

## 2017-09-21 DIAGNOSIS — E279 Disorder of adrenal gland, unspecified: Secondary | ICD-10-CM | POA: Insufficient documentation

## 2017-09-21 DIAGNOSIS — I2510 Atherosclerotic heart disease of native coronary artery without angina pectoris: Secondary | ICD-10-CM | POA: Diagnosis not present

## 2017-09-21 DIAGNOSIS — M899 Disorder of bone, unspecified: Secondary | ICD-10-CM | POA: Insufficient documentation

## 2017-09-21 DIAGNOSIS — E041 Nontoxic single thyroid nodule: Secondary | ICD-10-CM | POA: Insufficient documentation

## 2017-09-21 LAB — GLUCOSE, CAPILLARY: GLUCOSE-CAPILLARY: 224 mg/dL — AB (ref 65–99)

## 2017-09-21 MED ORDER — FLUDEOXYGLUCOSE F - 18 (FDG) INJECTION
13.1700 | Freq: Once | INTRAVENOUS | Status: AC | PRN
  Administered 2017-09-21: 13.17 via INTRAVENOUS

## 2017-09-22 ENCOUNTER — Ambulatory Visit: Payer: Medicare Other | Admitting: Oncology

## 2017-10-01 NOTE — Progress Notes (Signed)
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  INTERVAL HISTORY: Patient returns to clinic today for further evaluation and discussion of his PET scan results.  A biopsy of his liver was initially scheduled, but needed to be delayed secondary to atrial fibrillation and cardiac clearance.  He continues to be anxious and has insomnia, but otherwise feels well.  He also complains of increased pain. He has no neurologic complaints.  He denies any recent fevers or illnesses.  He has a fair appetite and denies weight loss.  He has no chest pain or shortness of breath.  He denies any nausea, vomiting, constipation, or diarrhea.  He does not complain of melena or hematochezia today.  He has no urinary complaints.  Patient offers no further specific complaints today.  REVIEW OF SYSTEMS:   Review of Systems  Constitutional: Negative.  Negative for fever, malaise/fatigue and weight loss.  Respiratory: Negative.  Negative for cough, hemoptysis and shortness of breath.   Cardiovascular: Negative.  Negative for chest pain and leg swelling.  Gastrointestinal: Negative.  Negative for abdominal pain, blood in stool and melena.  Genitourinary: Negative.  Negative for dysuria.  Musculoskeletal: Positive for joint pain.  Skin: Negative.  Negative for rash.  Neurological: Negative.  Negative for sensory change and weakness.  Psychiatric/Behavioral: The patient is nervous/anxious and has insomnia.     As per HPI. Otherwise, a complete review of systems is negative.  PAST MEDICAL HISTORY: Past Medical History:  Diagnosis Date  . Anxiety   . Arthritis   . Bladder cancer ([place])   . Chronic kidney disease   . Depression     . Diabetes ([place])   . Dysrhythmia    atrial fib  . GERD (gastroesophageal reflux disease)   . Hypertension   . Restless leg syndrome   . Shortness of breath dyspnea    with exertion  . Skin cancer   . Sleep apnea    CPAP "once in awhile"    PAST SURGICAL HISTORY: Past Surgical History:  Procedure Laterality Date  . APPENDECTOMY    . JOINT REPLACEMENT Left    Total Knee Replacement  . KNEE ARTHROSCOPY Left   . REPLACEMENT TOTAL KNEE Left 2012   Dr. Little Ishikawa, Jr. [facility]  . TRANSURETHRAL RESECTION OF BLADDER TUMOR N/A 03/14/2016   Procedure: TRANSURETHRAL RESECTION OF BLADDER TUMOR (TURBT);  Surgeon: Hollice Espy, MD;  Location: [facility] ORS;  Service: Urology;  Laterality: N/A;    FAMILY HISTORY: Family History  Problem Relation Age of Onset  . Cancer Mother        Breast  . Heart failure Father   . Prostate cancer Neg Hx   . Bladder Cancer Neg Hx     ADVANCED DIRECTIVES (Y/N):  N  HEALTH MAINTENANCE: Social History   Tobacco Use  . Smoking status: Former Smoker    Packs/day: 1.00    Years: 20.00    Pack years: 20.00    Types: Cigarettes    Last attempt to quit: 1979    Years since quitting: 40.2  . Smokeless tobacco: Never Used  Substance Use Topics  . Alcohol use: Yes    Alcohol/week: 1.2 oz    Types: 2 Cans of beer per week  Comment: occassional  . Drug use: No     Colonoscopy:  PAP:  Bone density:  Lipid panel:  No Known Allergies  Current Outpatient Medications  Medication Sig Dispense Refill  . ALPRAZolam (XANAX) 0.25 MG tablet Take 1 tablet (0.25 mg total) by mouth at bedtime as needed for anxiety. (Patient not taking: Reported on 10/06/2017) 30 tablet 0  . aspirin EC 81 MG tablet Take 81 mg by mouth daily.     Marland Kitchen atorvastatin (LIPITOR) 40 MG tablet Take 1 tablet by mouth once a day    . carvedilol (COREG) 12.5 MG tablet taking 25 mg bid    . ferrous sulfate (SLOW RELEASE IRON) 160 (50 Fe) MG TBCR SR tablet Take 160 mg by mouth 2 (two) times  daily.     Marland Kitchen glipiZIDE (GLUCOTROL) 10 MG tablet Take 1 tablet by mouth once a night    . glucose blood (PRECISION QID TEST) test strip Use 3 (three) times daily. Use as instructed.    Marland Kitchen lisinopril (PRINIVIL,ZESTRIL) 40 MG tablet Take 1 tablet by mouth once daily    . Multiple Vitamin (MULTI-VITAMINS) TABS Take 1 tablet by mouth daily. Alive Mens Multivitamin    . Omega-3 Fatty Acids (FISH OIL PO) Take 1 capsule by mouth 2 (two) times daily.     Marland Kitchen omeprazole (PRILOSEC) 20 MG capsule once daily    . pioglitazone (ACTOS) 45 MG tablet Take 1 tablet by mouth morning    . ALPRAZolam (XANAX) 0.5 MG tablet Take 1 tablet (0.5 mg total) by mouth at bedtime as needed for anxiety. 30 tablet 0  . amLODipine (NORVASC) 5 MG tablet Take 1 tablet by mouth once a day--bedtime    . docusate sodium (COLACE) 100 MG capsule Take 1 capsule (100 mg total) by mouth 2 (two) times daily. (Patient not taking: Reported on 10/02/2017) 60 capsule 0  . furosemide (LASIX) 20 MG tablet Take 20-60 mg by mouth daily as needed for fluid or edema.     . gabapentin (NEURONTIN) 300 MG capsule Take 300 mg by mouth at bedtime.     . Oxycodone HCl 10 MG TABS Take 1 tablet (10 mg total) by mouth 2 (two) times daily as needed. 60 tablet 0   No current facility-administered medications for this visit.    Facility-Administered Medications Ordered in Other Visits  Medication Dose Route Frequency Provider Last Rate Last Dose  . 0.9 %  sodium chloride infusion   Intravenous Continuous Docia Barrier, PA 20 mL/hr at 10/06/17 1022    . fentaNYL (SUBLIMAZE) 100 MCG/2ML injection           . hydrALAZINE (APRESOLINE) injection 10 mg  10 mg Intravenous Once [name] Mariscal, MD      . midazolam (VERSED) 5 MG/5ML injection             OBJECTIVE: Vitals:   10/02/17 1207  BP: (!) 159/88  Pulse: 97  Resp: 20  Temp: 98.7 F (37.1 C)     Body mass index is 43.17 kg/m.    ECOG FS:0 - Asymptomatic  General: Well-developed, well-nourished,  no acute distress.  Sitting in a wheelchair. Eyes: Pink conjunctiva, anicteric sclera. Lungs: Clear to auscultation bilaterally. Heart: Regular rate and rhythm. No rubs, murmurs, or gallops. Abdomen: Soft, nontender, nondistended. No organomegaly noted, normoactive bowel sounds. Musculoskeletal: No edema, cyanosis, or clubbing. Neuro: Alert, answering all questions appropriately. Cranial nerves grossly intact. Skin: No rashes or petechiae noted. Psych: Normal affect.   LAB RESULTS:  Lab Results  Component Value Date   NA 138 09/15/2017   K 4.9 09/15/2017   CL 106 09/15/2017   CO2 21 (L) 09/15/2017   GLUCOSE 211 (H) 09/15/2017   BUN 32 (H) 09/15/2017   CREATININE 2.27 (H) 09/15/2017   CALCIUM 9.0 09/15/2017   PROT 6.9 09/15/2017   ALBUMIN 3.2 (L) 09/15/2017   AST 27 09/15/2017   ALT 25 09/15/2017   ALKPHOS 181 (H) 09/15/2017   BILITOT 0.6 09/15/2017   GFRNONAA 27 (L) 09/15/2017   GFRAA 31 (L) 09/15/2017    Lab Results  Component Value Date   WBC 11.0 (H) 10/06/2017   NEUTROABS 6.2 09/15/2017   HGB 8.9 (L) 10/06/2017   HCT 28.2 (L) 10/06/2017   MCV 87.4 10/06/2017   PLT 407 10/06/2017     STUDIES: Nm Pet Image Initial (pi) Skull Base To Thigh  Result Date: 09/21/2017 CLINICAL DATA:  Initial treatment strategy for liver mass. EXAM: NUCLEAR MEDICINE PET SKULL BASE TO THIGH TECHNIQUE: 13.2 mCi F-18 FDG was injected intravenously. Full-ring PET imaging was performed from the skull base to thigh after the radiotracer. CT data was obtained and used for attenuation correction and anatomic localization. Fasting blood glucose: 224 mg/dl Mediastinal blood pool activity: SUV max 2.8 COMPARISON:  CT abdomen 07/14/2017 FINDINGS: The PET data is somewhat grainy, some of this may be due to the patient's hyperglycemia. NECK: There is hypodensity in the CT data in the right medial cerebellum on image 19/4, some of this may well be due to streak artifact and I do not discern a definite  abnormality in this vicinity on the PET data, but this might prompt further brain imaging. Anterior to the right thyroid lobe there is a 4.1 by 2.3 cm mass or nodule with a faint linear internal calcification and maximum SUV of 3.5. The parenchymal component has a fairly low density at 27 Hounsfield units. Incidental CT findings: None CHEST: A smoothly marginated 4.9 by 4.6 cm left upper lobe mass in the apical segment has a maximum SUV of 10.9, favoring malignancy. A separate left upper lobe mass along the mediastinal margin, versus a large AP window lymph node, measures proximally 7.9 by 4.2 cm on image 91/4, and has a milder degree of peripheral hypermetabolic activity, with maximum SUV of 6.7. This extends back to the level of the left hilum. There are numerous additional scattered small nodules in both lungs. The tiny nodules do not have demonstrable metabolic activity. A 1.4 by 1.2 cm rounded nodule in the right lower lobe on image 109/4 has a maximum SUV of 1.9. Incidental CT findings: Trace left pleural effusion. Moderate cardiomegaly. Coronary, aortic arch, and branch vessel atherosclerotic vascular disease. ABDOMEN/PELVIS: Scattered hypodense liver masses are indistinctly seen in the liver parenchyma. These appear to have mildly accentuated activity compared to background liver activity. For example, an approximately 4.5 cm mass believed to primarily be in segment 4a of the liver has a maximum SUV of 9.0. The inferior mass in segment 6 measures about 5.8 cm and has a maximum SUV of 7.7. Background hepatic activity is difficult to measure due to the mass is but may be as much as 4.6. The masses do appear to be larger than on 07/15/2007. A small right adrenal mass is probably an adenoma given the low-density, and measures 1.6 by 1.4 cm. There is only low-grade associated metabolic activity. Exophytic photopenic lesion from the left kidney compatible with a cyst. Scattered multifocal hypermetabolic bowel  activity favoring physiologic  activity, without definite CT correlate. Incidental CT findings: Although not appreciably hypermetabolic, there is new nodularity along the liver margin and right paracolic gutter. For example, 1 nodule on image 188/4 measures 2.1 by 1.0 cm. 2 lymph nodes along the gastrohepatic ligament measuring 1.2 and 1.0 cm on image 152/4, and were not previously visible. SKELETON: There is abnormal hypermetabolic activity along the prominent anterior spurring along the right sacroiliac joint. There is new (compared to 09/14/2016) bony demineralization/destruction of this spurring along with accentuated soft tissue density along the iloipsoas in this region, with maximum standard uptake value 9.4. This does not appear to be distributed throughout the SI joint and accordingly I am skeptical of septic arthritis. Increasing conspicuity of a 2.4 cm lytic lesion of the right L4 vertebral body causing new demineralization of the posterior cortex, maximum SUV 7.9. There is more subtle lytic lesion in the right T11 vertebral body measuring 1.7 by 1.3 cm on image 141/4 with maximum SUV 6.4. Incidental CT findings: none IMPRESSION: 1. The PET data is noisy on today's exam, possibly due to a combination of the patient's body habitus and hyperglycemia during imaging. The information is felt to be diagnostic in quality but has reduced sensitivity for subtle lesions. 2. The most hypermetabolic left upper lobe mass measures 4.9 cm in diameter with maximum SUV of 10.9, compatible with malignancy. Separate left upper lobe mass along the mediastinal margin could be a pulmonary mass or a large AP window lymph node, measuring up to 7.9 cm with maximum SUV 6.7. Numerous scattered nodules in both lungs are present, most of which are too small to characterize, but are significantly increased in number and size compared to 07/14/2017. 3. Scattered hypodense masses in the liver. Index masses are mild to moderately  hypermetabolic compared to the liver and favor diffuse hepatic metastatic disease. 4. New tumor nodules along the liver capsule and right-sided omentum, along with new adenopathy in the AP window, suspicious for malignancy but not measurably hypermetabolic. 5. Three areas of suspicion for osseous metastatic disease. There is a 2.4 cm lytic right L4 vertebral body lesion which is hypermetabolic. Also a 1.7 cm T11 vertebral body lesion, mildly hypermetabolic. Finally, there is increased soft tissue density and erosion of the anterior bony spur along the right SI joint with local hypermetabolic activity suspicious for malignancy. 6. Anterior to the right thyroid lobe a 4.1 by 2.3 cm mass or nodule is present with maximum SUV 3.5. I am uncertain whether this is simply a benign thyroid nodule or low-grade tumor related to the metastatic process. 7. Questionable hypodensity in the right cerebellum, probably artifact related to streak from the skull base, but consider dedicated brain imaging for further characterization. 8. Other imaging findings of potential clinical significance: Trace left pleural effusion. Moderate cardiomegaly. Aortic Atherosclerosis (ICD10-I70.0). Coronary atherosclerosis. Small right adrenal mass suspected of being an adenoma based on density. Electronically Signed   By: Van Clines M.D.   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  PLAN:    1. 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  Suspect lung origin.  Patient has an ultrasound-guided biopsy of his liver on Friday, October 06, 2017. Will also get an MRI of the brain to complete the staging workup.  CEA, CA-19-9, and AFP are all within normal limits. Return to clinic  on October 13, 2017 to discuss the results and treatment planning.   2.  Anxiety:  Patient was given a prescription of Xanax of 0.5 mg twice per day today. 3.  Pain: Patient was given a prescription for oxycodone 10 mg tabs as needed.  Approximately 30 minutes was spent in discussion of which greater than 50% was consultation.  Patient expressed understanding and was in agreement with this plan. He also understands that He can call clinic at any time with any questions, concerns, or complaints.   Cancer Staging No matching staging information was found for the patient.  Lloyd Huger, MD   10/06/2017 10:38 AM

## 2017-10-02 ENCOUNTER — Other Ambulatory Visit: Payer: Self-pay

## 2017-10-02 ENCOUNTER — Other Ambulatory Visit: Payer: Self-pay | Admitting: *Deleted

## 2017-10-02 ENCOUNTER — Inpatient Hospital Stay: Payer: Medicare Other | Attending: Oncology | Admitting: Oncology

## 2017-10-02 VITALS — BP 159/88 | HR 97 | Temp 98.7°F | Resp 20 | Wt 327.2 lb

## 2017-10-02 DIAGNOSIS — M545 Low back pain: Secondary | ICD-10-CM | POA: Insufficient documentation

## 2017-10-02 DIAGNOSIS — G4700 Insomnia, unspecified: Secondary | ICD-10-CM | POA: Diagnosis not present

## 2017-10-02 DIAGNOSIS — Z5112 Encounter for antineoplastic immunotherapy: Secondary | ICD-10-CM | POA: Diagnosis not present

## 2017-10-02 DIAGNOSIS — R52 Pain, unspecified: Secondary | ICD-10-CM

## 2017-10-02 DIAGNOSIS — R918 Other nonspecific abnormal finding of lung field: Secondary | ICD-10-CM | POA: Diagnosis not present

## 2017-10-02 DIAGNOSIS — C787 Secondary malignant neoplasm of liver and intrahepatic bile duct: Secondary | ICD-10-CM | POA: Diagnosis not present

## 2017-10-02 DIAGNOSIS — F419 Anxiety disorder, unspecified: Secondary | ICD-10-CM | POA: Diagnosis not present

## 2017-10-02 DIAGNOSIS — C7800 Secondary malignant neoplasm of unspecified lung: Secondary | ICD-10-CM | POA: Diagnosis not present

## 2017-10-02 DIAGNOSIS — R5383 Other fatigue: Secondary | ICD-10-CM | POA: Insufficient documentation

## 2017-10-02 DIAGNOSIS — Z79899 Other long term (current) drug therapy: Secondary | ICD-10-CM | POA: Insufficient documentation

## 2017-10-02 DIAGNOSIS — C7951 Secondary malignant neoplasm of bone: Secondary | ICD-10-CM

## 2017-10-02 DIAGNOSIS — I4891 Unspecified atrial fibrillation: Secondary | ICD-10-CM | POA: Diagnosis not present

## 2017-10-02 DIAGNOSIS — C439 Malignant melanoma of skin, unspecified: Secondary | ICD-10-CM | POA: Insufficient documentation

## 2017-10-02 DIAGNOSIS — R160 Hepatomegaly, not elsewhere classified: Secondary | ICD-10-CM

## 2017-10-02 MED ORDER — OXYCODONE HCL 10 MG PO TABS: 10.0000 mg | ORAL_TABLET | Freq: Two times a day (BID) | ORAL | 0 refills | Status: DC | PRN

## 2017-10-02 MED ORDER — ALPRAZOLAM 0.5 MG PO TABS: 0.5000 mg | ORAL_TABLET | Freq: Every evening | ORAL | 0 refills | Status: DC | PRN

## 2017-10-02 NOTE — Progress Notes (Signed)
Here for follow up . See follow up for concerns  Need renewal of xanax 0.25 mg -per pt one didn't work so taking 2 per night and ran out. Tillie Rung RN informed

## 2017-10-05 ENCOUNTER — Other Ambulatory Visit: Payer: Self-pay | Admitting: Student

## 2017-10-06 ENCOUNTER — Ambulatory Visit
Admission: RE | Admit: 2017-10-06 | Discharge: 2017-10-06 | Disposition: A | Discharge status: Home / Self Care | Payer: Medicare Other | Source: Ambulatory Visit | Attending: Nephrology | Admitting: Nephrology

## 2017-10-06 DIAGNOSIS — Z87891 Personal history of nicotine dependence: Secondary | ICD-10-CM | POA: Insufficient documentation

## 2017-10-06 DIAGNOSIS — C787 Secondary malignant neoplasm of liver and intrahepatic bile duct: Secondary | ICD-10-CM | POA: Diagnosis not present

## 2017-10-06 DIAGNOSIS — Z7982 Long term (current) use of aspirin: Secondary | ICD-10-CM | POA: Insufficient documentation

## 2017-10-06 DIAGNOSIS — G2581 Restless legs syndrome: Secondary | ICD-10-CM | POA: Diagnosis not present

## 2017-10-06 DIAGNOSIS — I129 Hypertensive chronic kidney disease with stage 1 through stage 4 chronic kidney disease, or unspecified chronic kidney disease: Secondary | ICD-10-CM | POA: Insufficient documentation

## 2017-10-06 DIAGNOSIS — G4730 Sleep apnea, unspecified: Secondary | ICD-10-CM | POA: Diagnosis not present

## 2017-10-06 DIAGNOSIS — R918 Other nonspecific abnormal finding of lung field: Secondary | ICD-10-CM | POA: Diagnosis not present

## 2017-10-06 DIAGNOSIS — F419 Anxiety disorder, unspecified: Secondary | ICD-10-CM | POA: Diagnosis not present

## 2017-10-06 DIAGNOSIS — K5900 Constipation, unspecified: Secondary | ICD-10-CM | POA: Diagnosis not present

## 2017-10-06 DIAGNOSIS — R160 Hepatomegaly, not elsewhere classified: Secondary | ICD-10-CM | POA: Diagnosis not present

## 2017-10-06 DIAGNOSIS — Z8551 Personal history of malignant neoplasm of bladder: Secondary | ICD-10-CM | POA: Diagnosis not present

## 2017-10-06 DIAGNOSIS — Z85828 Personal history of other malignant neoplasm of skin: Secondary | ICD-10-CM | POA: Diagnosis not present

## 2017-10-06 DIAGNOSIS — N189 Chronic kidney disease, unspecified: Secondary | ICD-10-CM | POA: Diagnosis not present

## 2017-10-06 DIAGNOSIS — Z79899 Other long term (current) drug therapy: Secondary | ICD-10-CM | POA: Insufficient documentation

## 2017-10-06 DIAGNOSIS — E1122 Type 2 diabetes mellitus with diabetic chronic kidney disease: Secondary | ICD-10-CM | POA: Insufficient documentation

## 2017-10-06 DIAGNOSIS — K219 Gastro-esophageal reflux disease without esophagitis: Secondary | ICD-10-CM | POA: Diagnosis not present

## 2017-10-06 DIAGNOSIS — F329 Major depressive disorder, single episode, unspecified: Secondary | ICD-10-CM | POA: Insufficient documentation

## 2017-10-06 DIAGNOSIS — K769 Liver disease, unspecified: Secondary | ICD-10-CM | POA: Diagnosis present

## 2017-10-06 HISTORY — DX: Cardiac arrhythmia, unspecified: I49.9

## 2017-10-06 LAB — CBC
HEMATOCRIT: 28.2 % — AB (ref 40.0–52.0)
HEMOGLOBIN: 8.9 g/dL — AB (ref 13.0–18.0)
MCH: 27.5 pg (ref 26.0–34.0)
MCHC: 31.4 g/dL — AB (ref 32.0–36.0)
MCV: 87.4 fL (ref 80.0–100.0)
Platelets: 407 10*3/uL (ref 150–440)
RBC: 3.23 MIL/uL — ABNORMAL LOW (ref 4.40–5.90)
RDW: 13.5 % (ref 11.5–14.5)
WBC: 11 10*3/uL — ABNORMAL HIGH (ref 3.8–10.6)

## 2017-10-06 LAB — APTT: APTT: 32 s (ref 24–36)

## 2017-10-06 LAB — PROTIME-INR
INR: 1.12
Prothrombin Time: 14.3 seconds (ref 11.4–15.2)

## 2017-10-06 LAB — GLUCOSE, CAPILLARY: Glucose-Capillary: 199 mg/dL — ABNORMAL HIGH (ref 65–99)

## 2017-10-06 MED ORDER — FENTANYL CITRATE (PF) 100 MCG/2ML IJ SOLN
INTRAMUSCULAR | Status: AC | PRN
  Administered 2017-10-06: 50 ug via INTRAVENOUS

## 2017-10-06 MED ORDER — FENTANYL CITRATE (PF) 100 MCG/2ML IJ SOLN
INTRAMUSCULAR | Status: AC
  Filled 2017-10-06: qty 4

## 2017-10-06 MED ORDER — HYDRALAZINE HCL 20 MG/ML IJ SOLN
10.0000 mg | Freq: Once | INTRAMUSCULAR | Status: AC
  Administered 2017-10-06: 10 mg via INTRAVENOUS
  Filled 2017-10-06: qty 0.5

## 2017-10-06 MED ORDER — SODIUM CHLORIDE 0.9 % IV SOLN
INTRAVENOUS | Status: DC
  Administered 2017-10-06: 10:00:00 via INTRAVENOUS

## 2017-10-06 MED ORDER — MIDAZOLAM HCL 5 MG/5ML IJ SOLN
INTRAMUSCULAR | Status: AC | PRN
  Administered 2017-10-06 (×2): 1 mg via INTRAVENOUS

## 2017-10-06 MED ORDER — MIDAZOLAM HCL 5 MG/5ML IJ SOLN
INTRAMUSCULAR | Status: AC
  Filled 2017-10-06: qty 5

## 2017-10-06 MED ORDER — HYDRALAZINE HCL 10 MG PO TABS
10.0000 mg | ORAL_TABLET | Freq: Once | ORAL | Status: AC
  Administered 2017-10-06: 10 mg via ORAL
  Filled 2017-10-06: qty 1

## 2017-10-06 MED ORDER — HYDRALAZINE HCL 20 MG/ML IJ SOLN
INTRAMUSCULAR | Status: AC
  Filled 2017-10-06: qty 1

## 2017-10-06 NOTE — H&P (Signed)
Chief Complaint: Liver masses  Referring Physician(s): Hiller  Supervising Physician: [name] Mariscal  Patient Status: [facility] - Out-pt  History of Present Illness: Tyler Henderson is a 72 y.o. male who had a CT scan of the abdomen and pelvis to evaluate his kidneys.  He was noted to have multiple liver masses highly suspicious for underlying metastatic disease.    He is here today for image guided liver biopsy.  PET done 09/21/2017 = Scattered hypodense liver masses are indistinctly seen in the liver parenchyma. These appear to have mildly accentuated activity compared to background liver activity. For example, an approximately 4.5 cm mass believed to primarily be in segment 4a of the liver has a maximum SUV of 9.0. The inferior mass in segment 6 measures about 5.8 cm and has a maximum SUV of 7.7. Background hepatic activity is difficult to measure due to the mass is but may be as much as 4.6. The masses do appear to be larger than on 07/15/2007.  He complains of constipation, but otherwise feels well.    His biopsy was cancelled last time due to elevated blood pressure.  He did take his medication this morning, otherwise he is NPO.  ROS is negative.  Past Medical History:  Diagnosis Date  . Anxiety   . Arthritis   . Bladder cancer ([place])   . Chronic kidney disease   . Depression   . Diabetes ([place])   . GERD (gastroesophageal reflux disease)   . Hypertension   . Restless leg syndrome   . Shortness of breath dyspnea    with exertion  . Skin cancer   . Sleep apnea    CPAP "once in awhile"    Past Surgical History:  Procedure Laterality Date  . APPENDECTOMY    . JOINT REPLACEMENT Left    Total Knee Replacement  . KNEE ARTHROSCOPY Left   . REPLACEMENT TOTAL KNEE Left 2012   Dr. Little Ishikawa, Jr. [facility]  . TRANSURETHRAL RESECTION OF BLADDER TUMOR N/A 03/14/2016   Procedure: TRANSURETHRAL RESECTION OF BLADDER TUMOR (TURBT);  Surgeon: Hollice Espy, MD;   Location: [facility] ORS;  Service: Urology;  Laterality: N/A;    Allergies: Patient has no known allergies.  Medications: Prior to Admission medications   Medication Sig Start Date End Date Taking? Authorizing Provider  ALPRAZolam (XANAX) 0.25 MG tablet Take 1 tablet (0.25 mg total) by mouth at bedtime as needed for anxiety. 09/15/17   Lloyd Huger, MD  ALPRAZolam Duanne Moron) 0.5 MG tablet Take 1 tablet (0.5 mg total) by mouth at bedtime as needed for anxiety. 10/02/17   Lloyd Huger, MD  amLODipine (NORVASC) 5 MG tablet Take 1 tablet by mouth once a day--bedtime 01/12/16   [provider]  aspirin EC 81 MG tablet Take 81 mg by mouth daily.     [provider]  atorvastatin (LIPITOR) 40 MG tablet Take 1 tablet by mouth once a day 03/23/15   [provider]  carvedilol (COREG) 12.5 MG tablet taking 25 mg bid 06/26/15   [provider]  docusate sodium (COLACE) 100 MG capsule Take 1 capsule (100 mg total) by mouth 2 (two) times daily. Patient not taking: Reported on 10/02/2017 03/14/16   Hollice Espy, MD  ferrous sulfate (SLOW RELEASE IRON) 160 (50 Fe) MG TBCR SR tablet Take 160 mg by mouth 2 (two) times daily.     [provider]  furosemide (LASIX) 20 MG tablet Take 20-60 mg by mouth daily as needed for  fluid or edema.  06/29/15   [provider]  gabapentin (NEURONTIN) 300 MG capsule Take 300 mg by mouth at bedtime.  03/09/15 09/18/17  [provider]  glipiZIDE (GLUCOTROL) 10 MG tablet Take 1 tablet by mouth once a night 03/23/15   [provider]  glucose blood (PRECISION QID TEST) test strip Use 3 (three) times daily. Use as instructed. 04/13/17 04/13/18  [provider]  lisinopril (PRINIVIL,ZESTRIL) 40 MG tablet Take 1 tablet by mouth once daily 06/25/15   [provider]  Multiple Vitamin (MULTI-VITAMINS) TABS Take 1 tablet by mouth daily. Alive Mens Multivitamin    [provider]  Omega-3  Fatty Acids (FISH OIL PO) Take 1 capsule by mouth 2 (two) times daily.     [provider]  omeprazole (PRILOSEC) 20 MG capsule once daily 03/23/15   [provider]  Oxycodone HCl 10 MG TABS Take 1 tablet (10 mg total) by mouth 2 (two) times daily as needed. 10/02/17   Lloyd Huger, MD  pioglitazone (ACTOS) 45 MG tablet Take 1 tablet by mouth morning 03/23/15   [provider]     Family History  Problem Relation Age of Onset  . Cancer Mother        Breast  . Heart failure Father   . Prostate cancer Neg Hx   . Bladder Cancer Neg Hx     Social History   Socioeconomic History  . Marital status: Married    Spouse name: Not on file  . Number of children: Not on file  . Years of education: Not on file  . Highest education level: Not on file  Social Needs  . Financial resource strain: Not on file  . Food insecurity - worry: Not on file  . Food insecurity - inability: Not on file  . Transportation needs - medical: Not on file  . Transportation needs - non-medical: Not on file  Occupational History  . Not on file  Tobacco Use  . Smoking status: Former Smoker    Packs/day: 1.00    Years: 20.00    Pack years: 20.00    Types: Cigarettes    Last attempt to quit: 1979    Years since quitting: 40.2  . Smokeless tobacco: Never Used  Substance and Sexual Activity  . Alcohol use: Yes    Alcohol/week: 1.2 oz    Types: 2 Cans of beer per week    Comment: occassional  . Drug use: No  . Sexual activity: Not on file  Other Topics Concern  . Not on file  Social History Narrative  . Not on file    Review of Systems: A 12 point ROS discussed and pertinent positives are indicated in the HPI above.  All other systems are negative. Review of Systems  Vital Signs: BP (!) 160/90   Pulse 98   Temp 98.9 F (37.2 C) (Oral)   Resp (!) 26   SpO2 94%   Physical Exam  Constitutional: He is oriented to person, place, and time. He appears well-developed.    HENT:  Head: Normocephalic and atraumatic.  Eyes: EOM are normal.  Neck: Normal range of motion.  Cardiovascular: Normal rate, regular rhythm and normal heart sounds.  Pulmonary/Chest: Effort normal and breath sounds normal. No stridor. No respiratory distress.  Abdominal: Soft. There is no tenderness.  Musculoskeletal: Normal range of motion.  Neurological: He is alert and oriented to person, place, and time.  Skin: Skin is warm and dry.  Psychiatric: He has a normal mood and affect. His behavior is normal. Judgment and thought content normal.  Vitals reviewed.   Imaging: Nm Pet Image Initial (pi) Skull Base To Thigh  Result Date: 09/21/2017 CLINICAL DATA:  Initial treatment strategy for liver mass. EXAM: NUCLEAR MEDICINE PET SKULL BASE TO THIGH TECHNIQUE: 13.2 mCi F-18 FDG was injected intravenously. Full-ring PET imaging was performed from the skull base to thigh after the radiotracer. CT data was obtained and used for attenuation correction and anatomic localization. Fasting blood glucose: 224 mg/dl Mediastinal blood pool activity: SUV max 2.8 COMPARISON:  CT abdomen 07/14/2017 FINDINGS: The PET data is somewhat grainy, some of this may be due to the patient's hyperglycemia. NECK: There is hypodensity in the CT data in the right medial cerebellum on image 19/4, some of this may well be due to streak artifact and I do not discern a definite abnormality in this vicinity on the PET data, but this might prompt further brain imaging. Anterior to the right thyroid lobe there is a 4.1 by 2.3 cm mass or nodule with a faint linear internal calcification and maximum SUV of 3.5. The parenchymal component has a fairly low density at 27 Hounsfield units. Incidental CT findings: None CHEST: A smoothly marginated 4.9 by 4.6 cm left upper lobe mass in the apical segment has a maximum SUV of 10.9, favoring malignancy. A separate left upper lobe mass along the mediastinal margin, versus a large AP window lymph  node, measures proximally 7.9 by 4.2 cm on image 91/4, and has a milder degree of peripheral hypermetabolic activity, with maximum SUV of 6.7. This extends back to the level of the left hilum. There are numerous additional scattered small nodules in both lungs. The tiny nodules do not have demonstrable metabolic activity. A 1.4 by 1.2 cm rounded nodule in the right lower lobe on image 109/4 has a maximum SUV of 1.9. Incidental CT findings: Trace left pleural effusion. Moderate cardiomegaly. Coronary, aortic arch, and branch vessel atherosclerotic vascular disease. ABDOMEN/PELVIS: Scattered hypodense liver masses are indistinctly seen in the liver parenchyma. These appear to have mildly accentuated activity compared to background liver activity. For example, an approximately 4.5 cm mass believed to primarily be in segment 4a of the liver has a maximum SUV of 9.0. The inferior mass in segment 6 measures about 5.8 cm and has a maximum SUV of 7.7. Background hepatic activity is difficult to measure due to the mass is but may be as much as 4.6. The masses do appear to be larger than on 07/15/2007. A small right adrenal mass is probably an adenoma given the low-density, and measures 1.6 by 1.4 cm. There is only low-grade associated metabolic activity. Exophytic photopenic lesion from the left kidney compatible with a cyst. Scattered multifocal hypermetabolic bowel activity favoring physiologic activity, without definite CT correlate. Incidental CT findings: Although not appreciably hypermetabolic, there is new nodularity along the liver margin and right paracolic gutter. For example, 1 nodule on image 188/4 measures 2.1 by 1.0 cm. 2 lymph nodes along the gastrohepatic ligament measuring 1.2 and 1.0 cm on image 152/4, and were not previously visible. SKELETON: There is abnormal hypermetabolic activity along the prominent anterior spurring along the right sacroiliac joint. There is new (compared to 09/14/2016) bony  demineralization/destruction of this spurring along with accentuated soft tissue density along the iloipsoas in this region, with maximum standard uptake value 9.4. This does not appear to be distributed throughout the SI joint and accordingly I am skeptical of  septic arthritis. Increasing conspicuity of a 2.4 cm lytic lesion of the right L4 vertebral body causing new demineralization of the posterior cortex, maximum SUV 7.9. There is more subtle lytic lesion in the right T11 vertebral body measuring 1.7 by 1.3 cm on image 141/4 with maximum SUV 6.4. Incidental CT findings: none IMPRESSION: 1. The PET data is noisy on today's exam, possibly due to a combination of the patient's body habitus and hyperglycemia during imaging. The information is felt to be diagnostic in quality but has reduced sensitivity for subtle lesions. 2. The most hypermetabolic left upper lobe mass measures 4.9 cm in diameter with maximum SUV of 10.9, compatible with malignancy. Separate left upper lobe mass along the mediastinal margin could be a pulmonary mass or a large AP window lymph node, measuring up to 7.9 cm with maximum SUV 6.7. Numerous scattered nodules in both lungs are present, most of which are too small to characterize, but are significantly increased in number and size compared to 07/14/2017. 3. Scattered hypodense masses in the liver. Index masses are mild to moderately hypermetabolic compared to the liver and favor diffuse hepatic metastatic disease. 4. New tumor nodules along the liver capsule and right-sided omentum, along with new adenopathy in the AP window, suspicious for malignancy but not measurably hypermetabolic. 5. Three areas of suspicion for osseous metastatic disease. There is a 2.4 cm lytic right L4 vertebral body lesion which is hypermetabolic. Also a 1.7 cm T11 vertebral body lesion, mildly hypermetabolic. Finally, there is increased soft tissue density and erosion of the anterior bony spur along the right SI  joint with local hypermetabolic activity suspicious for malignancy. 6. Anterior to the right thyroid lobe a 4.1 by 2.3 cm mass or nodule is present with maximum SUV 3.5. I am uncertain whether this is simply a benign thyroid nodule or low-grade tumor related to the metastatic process. 7. Questionable hypodensity in the right cerebellum, probably artifact related to streak from the skull base, but consider dedicated brain imaging for further characterization. 8. Other imaging findings of potential clinical significance: Trace left pleural effusion. Moderate cardiomegaly. Aortic Atherosclerosis (ICD10-I70.0). Coronary atherosclerosis. Small right adrenal mass suspected of being an adenoma based on density. Electronically Signed   By: Van Clines M.D.   On: 09/21/2017 15:16    Labs:  CBC: Recent Labs    09/15/17 1033  WBC 8.0  HGB 10.0*  HCT 30.6*  PLT 291    COAGS: Recent Labs    09/15/17 1033  INR 1.16    BMP: Recent Labs    09/13/17 0943 09/15/17 1033  NA 134* 138  K 5.0 4.9  CL 104 106  CO2 23 21*  GLUCOSE 224* 211*  BUN 33* 32*  CALCIUM 8.6* 9.0  CREATININE 1.80* 2.27*  GFRNONAA 36* 27*  GFRAA 41* 31*    LIVER FUNCTION TESTS: Recent Labs    09/13/17 0943 09/15/17 1033  BILITOT 0.3 0.6  AST 26 27  ALT 24 25  ALKPHOS 179* 181*  PROT 6.8 6.9  ALBUMIN 3.0* 3.2*    TUMOR MARKERS: No results for input(s): AFPTM, CEA, CA199, CHROMGRNA in the last 8760 hours.  Assessment and Plan:  Liver masses worrisome for underlying malignancy.  Will proceed with image guided liver biopsy today by Dr. Pascal Lux.  Hypertension = 160/90 today - Hydralazine given.  Risks and benefits discussed with the patient including, but not limited to bleeding, infection, damage to adjacent structures or low yield requiring additional tests.  All of the  patient's questions were answered, patient is agreeable to proceed. Consent signed and in chart.  Thank you for this interesting  consult.  I greatly enjoyed meeting Tyler Henderson and look forward to participating in their care.  A copy of this report was sent to the requesting provider on this date.  Electronically Signed: Murrell Redden, PA-C 10/06/2017, 9:44 AM   I spent a total of  30 Minutes in face to face in clinical consultation, greater than 50% of which was counseling/coordinating care for liver biopsy.

## 2017-10-06 NOTE — Procedures (Signed)
Pre Procedure Dx: Liver lesions Post Procedural Dx: Same  Technically successful US guided biopsy of indeterminate lesion within the medial segment of the left lobe of the liver.   EBL: Minimal  No immediate complications.   Ronny Bacon, MD Pager #: [phone]

## 2017-10-08 DIAGNOSIS — R918 Other nonspecific abnormal finding of lung field: Secondary | ICD-10-CM | POA: Insufficient documentation

## 2017-10-08 NOTE — Progress Notes (Signed)
[place]  [phone]) [phone] Fax:(336) [phone]  ID: Rolene Course OB: 1946/05/20  MR#: [id number]  ZHG#:[id number]  Patient Care Team: Sofie Hartigan, MD as PCP - General (Family Medicine)  CHIEF COMPLAINT: Stage IV metastatic melanoma with liver, lung, and bony metastasis.  INTERVAL HISTORY: Patient returns to clinic today for further evaluation, discussion of his biopsy results, and treatment planning.  He continues to have a decreased performance status with increased weakness and fatigue.  He continues to have back pain radiating down his leg.  He is anxious and has insomnia. He has no neurologic complaints.  He denies any recent fevers or illnesses.  He has a fair appetite and denies weight loss.  He has no chest pain or shortness of breath.  He denies any nausea, vomiting, constipation, or diarrhea. He does not complain of melena or hematochezia today.  He has no urinary complaints.  Patient offers no further specific complaints today.  REVIEW OF SYSTEMS:   Review of Systems  Constitutional: Positive for malaise/fatigue. Negative for fever and weight loss.  Respiratory: Negative.  Negative for cough, hemoptysis and shortness of breath.   Cardiovascular: Negative.  Negative for chest pain and leg swelling.  Gastrointestinal: Negative.  Negative for abdominal pain, blood in stool and melena.  Genitourinary: Negative.  Negative for dysuria.  Musculoskeletal: Positive for back pain and joint pain.  Skin: Negative.  Negative for rash.  Neurological: Negative.  Negative for dizziness, sensory change, focal weakness and weakness.  Psychiatric/Behavioral: The patient is nervous/anxious and has insomnia.     As per HPI. Otherwise, a complete review of systems is negative.  PAST MEDICAL HISTORY: Past Medical History:  Diagnosis Date  . Anxiety   . Arthritis   . Bladder cancer ([place])   . Chronic kidney disease   . Depression   . Diabetes ([place])   .  Dysrhythmia    atrial fib  . GERD (gastroesophageal reflux disease)   . Hypertension   . Restless leg syndrome   . Shortness of breath dyspnea    with exertion  . Skin cancer   . Sleep apnea    CPAP "once in awhile"    PAST SURGICAL HISTORY: Past Surgical History:  Procedure Laterality Date  . APPENDECTOMY    . JOINT REPLACEMENT Left    Total Knee Replacement  . KNEE ARTHROSCOPY Left   . REPLACEMENT TOTAL KNEE Left 2012   Dr. Little Ishikawa, Jr. [facility]  . TRANSURETHRAL RESECTION OF BLADDER TUMOR N/A 03/14/2016   Procedure: TRANSURETHRAL RESECTION OF BLADDER TUMOR (TURBT);  Surgeon: Hollice Espy, MD;  Location: [facility] ORS;  Service: Urology;  Laterality: N/A;    FAMILY HISTORY: Family History  Problem Relation Age of Onset  . Cancer Mother        Breast  . Heart failure Father   . Prostate cancer Neg Hx   . Bladder Cancer Neg Hx     ADVANCED DIRECTIVES (Y/N):  N  HEALTH MAINTENANCE: Social History   Tobacco Use  . Smoking status: Former Smoker    Packs/day: 1.00    Years: 20.00    Pack years: 20.00    Types: Cigarettes    Last attempt to quit: 1979    Years since quitting: 40.2  . Smokeless tobacco: Never Used  Substance Use Topics  . Alcohol use: Yes    Alcohol/week: 1.2 oz    Types: 2 Cans of beer per week    Comment: occassional  . Drug use: No  Colonoscopy:  PAP:  Bone density:  Lipid panel:  No Known Allergies  Current Outpatient Medications  Medication Sig Dispense Refill  . ALPRAZolam (XANAX) 0.5 MG tablet Take 1 tablet (0.5 mg total) by mouth at bedtime as needed for anxiety. (Patient taking differently: Take 0.5 mg by mouth daily as needed for anxiety. ) 30 tablet 0  . aspirin EC 81 MG tablet Take 81 mg by mouth every evening.     Marland Kitchen atorvastatin (LIPITOR) 40 MG tablet Take 40 mg by mouth in the evening    . carvedilol (COREG) 25 MG tablet Take 25 mg by mouth twice daily    . ferrous sulfate (SLOW RELEASE IRON) 160 (50 Fe) MG TBCR SR tablet  Take 160 mg by mouth 2 (two) times daily.     Marland Kitchen FLUoxetine (PROZAC) 10 MG capsule Take 10 mg by mouth daily.     . furosemide (LASIX) 20 MG tablet Take 20-40 mg by mouth daily as needed for fluid or edema.     Marland Kitchen glipiZIDE (GLUCOTROL) 10 MG tablet Take 10 mg by mouth in the evening    . lisinopril (PRINIVIL,ZESTRIL) 40 MG tablet Take 40 mg by mouth in the evening    . Multiple Vitamin (MULTI-VITAMINS) TABS Take 1 tablet by mouth daily. Alive Mens Multivitamin    . Omega-3 Fatty Acids (FISH OIL PO) Take 1 capsule by mouth 2 (two) times daily.     Marland Kitchen omeprazole (PRILOSEC) 20 MG capsule Take 20 mg by mouth in the evening    . Oxycodone HCl 10 MG TABS Take 1 tablet (10 mg total) by mouth 2 (two) times daily as needed. (Patient taking differently: Take 10 mg by mouth 2 (two) times daily as needed (for pain). ) 60 tablet 0  . pioglitazone (ACTOS) 45 MG tablet Take 45 mg by mouth in the morning    . docusate sodium (COLACE) 100 MG capsule Take 1 capsule (100 mg total) by mouth 2 (two) times daily. (Patient taking differently: Take 100-200 mg by mouth daily as needed for moderate constipation. ) 60 capsule 0  . gabapentin (NEURONTIN) 300 MG capsule Take 300 mg by mouth every evening.     . megestrol (MEGACE) 40 MG tablet Take 1 tablet (40 mg total) by mouth daily. 30 tablet 2   No current facility-administered medications for this visit.     OBJECTIVE: Vitals:   10/13/17 1108  BP: (!) 162/86  Pulse: 77  Resp: 20  Temp: 98.8 F (37.1 C)     There is no height or weight on file to calculate BMI.    ECOG FS:1 - Symptomatic but completely ambulatory  General: Well-developed, well-nourished, no acute distress.  Sitting in a wheelchair. Eyes: Pink conjunctiva, anicteric sclera. Lungs: Clear to auscultation bilaterally. Heart: Regular rate and rhythm. No rubs, murmurs, or gallops. Abdomen: Soft, nontender, nondistended. No organomegaly noted, normoactive bowel sounds. Musculoskeletal: No edema,  cyanosis, or clubbing. Neuro: Alert, answering all questions appropriately. Cranial nerves grossly intact. Skin: No rashes or petechiae noted. Psych: Normal affect.   LAB RESULTS:  Lab Results  Component Value Date   NA 138 09/15/2017   K 4.9 09/15/2017   CL 106 09/15/2017   CO2 21 (L) 09/15/2017   GLUCOSE 211 (H) 09/15/2017   BUN 32 (H) 09/15/2017   CREATININE 2.27 (H) 09/15/2017   CALCIUM 9.0 09/15/2017   PROT 6.9 09/15/2017   ALBUMIN 3.2 (L) 09/15/2017   AST 27 09/15/2017   ALT 25 09/15/2017  ALKPHOS 181 (H) 09/15/2017   BILITOT 0.6 09/15/2017   GFRNONAA 27 (L) 09/15/2017   GFRAA 31 (L) 09/15/2017    Lab Results  Component Value Date   WBC 11.0 (H) 10/06/2017   NEUTROABS 6.2 09/15/2017   HGB 8.9 (L) 10/06/2017   HCT 28.2 (L) 10/06/2017   MCV 87.4 10/06/2017   PLT 407 10/06/2017     STUDIES: Mr Jeri Cos IZ Contrast  Result Date: 10/11/2017 CLINICAL DATA:  Bladder cancer with metastatic disease to the liver, skeleton, and lungs. EXAM: MRI HEAD WITHOUT AND WITH CONTRAST TECHNIQUE: Multiplanar, multiecho pulse sequences of the brain and surrounding structures were obtained without and with intravenous contrast. CONTRAST:  70m MULTIHANCE GADOBENATE DIMEGLUMINE 529 MG/ML IV SOLN COMPARISON:  PET scan 09/21/2017. FINDINGS: Brain: No focal enhancing lesions are present to suggest metastatic disease the brain or meninges. Minimal white matter changes are within normal limits for age. No acute infarct, hemorrhage, or mass lesion is present. Ventricles are of normal size. The internal auditory canals are within normal limits bilaterally. The brainstem and cerebellum are normal. No significant extra-axial fluid collection is present. Vascular: Flow is present in the major intracranial arteries. Globes and orbits are at that Skull and upper cervical spine: The calvarium is intact. Focal lesions or enhancement are evident. The skull base is within normal limits. The craniocervical  junction is normal. Sinuses/Orbits: Mild mucosal thickening is present along the floor of the maxillary sinuses bilaterally. No fluid levels are present. The paranasal sinuses and mastoid air cells are otherwise clear. Globes and orbits are within normal limits. IMPRESSION: 1. No evidence for metastatic disease to the brain or meninges. 2. Normal MRI appearance of the brain for age. 3. Minimal maxillary sinus disease. Electronically Signed   By: CSan MorelleM.D.   On: 10/11/2017 11:54   Nm Pet Image Initial (pi) Skull Base To Thigh  Result Date: 09/21/2017 CLINICAL DATA:  Initial treatment strategy for liver mass. EXAM: NUCLEAR MEDICINE PET SKULL BASE TO THIGH TECHNIQUE: 13.2 mCi F-18 FDG was injected intravenously. Full-ring PET imaging was performed from the skull base to thigh after the radiotracer. CT data was obtained and used for attenuation correction and anatomic localization. Fasting blood glucose: 224 mg/dl Mediastinal blood pool activity: SUV max 2.8 COMPARISON:  CT abdomen 07/14/2017 FINDINGS: The PET data is somewhat grainy, some of this may be due to the patient's hyperglycemia. NECK: There is hypodensity in the CT data in the right medial cerebellum on image 19/4, some of this may well be due to streak artifact and I do not discern a definite abnormality in this vicinity on the PET data, but this might prompt further brain imaging. Anterior to the right thyroid lobe there is a 4.1 by 2.3 cm mass or nodule with a faint linear internal calcification and maximum SUV of 3.5. The parenchymal component has a fairly low density at 27 Hounsfield units. Incidental CT findings: None CHEST: A smoothly marginated 4.9 by 4.6 cm left upper lobe mass in the apical segment has a maximum SUV of 10.9, favoring malignancy. A separate left upper lobe mass along the mediastinal margin, versus a large AP window lymph node, measures proximally 7.9 by 4.2 cm on image 91/4, and has a milder degree of peripheral  hypermetabolic activity, with maximum SUV of 6.7. This extends back to the level of the left hilum. There are numerous additional scattered small nodules in both lungs. The tiny nodules do not have demonstrable metabolic activity. A 1.4 by  1.2 cm rounded nodule in the right lower lobe on image 109/4 has a maximum SUV of 1.9. Incidental CT findings: Trace left pleural effusion. Moderate cardiomegaly. Coronary, aortic arch, and branch vessel atherosclerotic vascular disease. ABDOMEN/PELVIS: Scattered hypodense liver masses are indistinctly seen in the liver parenchyma. These appear to have mildly accentuated activity compared to background liver activity. For example, an approximately 4.5 cm mass believed to primarily be in segment 4a of the liver has a maximum SUV of 9.0. The inferior mass in segment 6 measures about 5.8 cm and has a maximum SUV of 7.7. Background hepatic activity is difficult to measure due to the mass is but may be as much as 4.6. The masses do appear to be larger than on 07/15/2007. A small right adrenal mass is probably an adenoma given the low-density, and measures 1.6 by 1.4 cm. There is only low-grade associated metabolic activity. Exophytic photopenic lesion from the left kidney compatible with a cyst. Scattered multifocal hypermetabolic bowel activity favoring physiologic activity, without definite CT correlate. Incidental CT findings: Although not appreciably hypermetabolic, there is new nodularity along the liver margin and right paracolic gutter. For example, 1 nodule on image 188/4 measures 2.1 by 1.0 cm. 2 lymph nodes along the gastrohepatic ligament measuring 1.2 and 1.0 cm on image 152/4, and were not previously visible. SKELETON: There is abnormal hypermetabolic activity along the prominent anterior spurring along the right sacroiliac joint. There is new (compared to 09/14/2016) bony demineralization/destruction of this spurring along with accentuated soft tissue density along the  iloipsoas in this region, with maximum standard uptake value 9.4. This does not appear to be distributed throughout the SI joint and accordingly I am skeptical of septic arthritis. Increasing conspicuity of a 2.4 cm lytic lesion of the right L4 vertebral body causing new demineralization of the posterior cortex, maximum SUV 7.9. There is more subtle lytic lesion in the right T11 vertebral body measuring 1.7 by 1.3 cm on image 141/4 with maximum SUV 6.4. Incidental CT findings: none IMPRESSION: 1. The PET data is noisy on today's exam, possibly due to a combination of the patient's body habitus and hyperglycemia during imaging. The information is felt to be diagnostic in quality but has reduced sensitivity for subtle lesions. 2. The most hypermetabolic left upper lobe mass measures 4.9 cm in diameter with maximum SUV of 10.9, compatible with malignancy. Separate left upper lobe mass along the mediastinal margin could be a pulmonary mass or a large AP window lymph node, measuring up to 7.9 cm with maximum SUV 6.7. Numerous scattered nodules in both lungs are present, most of which are too small to characterize, but are significantly increased in number and size compared to 07/14/2017. 3. Scattered hypodense masses in the liver. Index masses are mild to moderately hypermetabolic compared to the liver and favor diffuse hepatic metastatic disease. 4. New tumor nodules along the liver capsule and right-sided omentum, along with new adenopathy in the AP window, suspicious for malignancy but not measurably hypermetabolic. 5. Three areas of suspicion for osseous metastatic disease. There is a 2.4 cm lytic right L4 vertebral body lesion which is hypermetabolic. Also a 1.7 cm T11 vertebral body lesion, mildly hypermetabolic. Finally, there is increased soft tissue density and erosion of the anterior bony spur along the right SI joint with local hypermetabolic activity suspicious for malignancy. 6. Anterior to the right thyroid  lobe a 4.1 by 2.3 cm mass or nodule is present with maximum SUV 3.5. I am uncertain whether this is  simply a benign thyroid nodule or low-grade tumor related to the metastatic process. 7. Questionable hypodensity in the right cerebellum, probably artifact related to streak from the skull base, but consider dedicated brain imaging for further characterization. 8. Other imaging findings of potential clinical significance: Trace left pleural effusion. Moderate cardiomegaly. Aortic Atherosclerosis (ICD10-I70.0). Coronary atherosclerosis. Small right adrenal mass suspected of being an adenoma based on density. Electronically Signed   By: Van Clines M.D.   On: 09/21/2017 15:16   US Biopsy (liver)  Result Date: 10/06/2017 INDICATION: Concern for metastatic lung cancer, now with multiple hypermetabolic liver lesions. Please perform ultrasound-guided liver lesion biopsy for tissue diagnostic purposes. EXAM: ULTRASOUND GUIDED LIVER LESION BIOPSY COMPARISON:  PET-CT - 09/21/2017 MEDICATIONS: None ANESTHESIA/SEDATION: Fentanyl 50 mcg IV; Versed 2 mg IV Total Moderate Sedation time:  14 Minutes. The patient's level of consciousness and vital signs were monitored continuously by radiology nursing throughout the procedure under my direct supervision. COMPLICATIONS: None immediate. PROCEDURE: Informed written consent was obtained from the patient after a discussion of the risks, benefits and alternatives to treatment. The patient understands and consents the procedure. A timeout was performed prior to the initiation of the procedure. Ultrasound scanning was performed of the right upper abdominal quadrant demonstrates multiple mixed echogenic lesions and masses within both the right and left lobes of the liver. An approximately 3.2 x 2.5 cm hypoechoic lesion/mass within the medial segment of left lobe liver correlating with the hypoattenuating lesion seen on preceding PET-CT image 153, series 4, was targeted for biopsy  given lesion location and sonographic window. The procedure was planned. The right upper abdominal quadrant was prepped and draped in the usual sterile fashion. The overlying soft tissues were anesthetized with 1% lidocaine with epinephrine. A 17 gauge, 6.8 cm co-axial needle was advanced into a peripheral aspect of the lesion. This was followed by 5 core biopsies with an 18 gauge core device under direct ultrasound guidance. The coaxial needle tract was embolized with a small amount of Gel-Foam slurry and superficial hemostasis was obtained with manual compression. Post procedural scanning was negative for definitive area of hemorrhage or additional complication. A dressing was placed. The patient tolerated the procedure well without immediate post procedural complication. IMPRESSION: Technically successful ultrasound guided core needle biopsy of indeterminate lesion/mass within the medial segment of the left lobe of the liver. Electronically Signed   By: [name] Mariscal M.D.   On: 10/06/2017 12:02    ASSESSMENT: Stage IV metastatic melanoma with liver, lung, and bony metastasis.  PLAN:    1. Stage IV metastatic melanoma with liver, lung, and bony metastasis: PET scan results from September 13, 2017 reviewed independently and report as above with widespread metastatic disease.  MRI of the brain negative for disease.  Liver biopsy consistent with metastatic melanoma. BRAF  mutation has been ordered and is pending at time of dictation. After lengthy discussion with the patient and his family, he wishes to proceed with palliative combination immunotherapy using nivolumab and ipilimumab.  Plan giving 4 cycles every 3 weeks of combination therapy and then transition to single agent maintenance nivolumab every 2 weeks until progression of disease.  Return to clinic in 1 week for consideration of cycle 1 of 4 of nivolumab and ipilimumab. 2.  Anxiety: Continue Xanax of 0.5 mg twice per day as needed. 3.  Pain: Continue  oxycodone 10 mg tabs as needed.  Patient was also given a referral to radiation oncology.  Approximately 30 minutes was spent in discussion  of which greater than 50% was consultation.  Patient expressed understanding and was in agreement with this plan. He also understands that He can call clinic at any time with any questions, concerns, or complaints.   Cancer Staging Metastatic melanoma [facility]) Staging form: Melanoma of the Skin, AJCC 8th Edition - Clinical stage from 10/15/2017: Stage IV (cTX, cNX, pM1c(1)) - Signed by Lloyd Huger, MD on 10/15/2017   Lloyd Huger, MD   10/15/2017 9:22 AM

## 2017-10-11 ENCOUNTER — Ambulatory Visit
Admission: RE | Admit: 2017-10-11 | Discharge: 2017-10-11 | Disposition: A | Discharge status: Home / Self Care | Payer: Medicare Other | Source: Ambulatory Visit | Attending: Oncology | Admitting: Oncology

## 2017-10-11 DIAGNOSIS — C787 Secondary malignant neoplasm of liver and intrahepatic bile duct: Secondary | ICD-10-CM | POA: Diagnosis not present

## 2017-10-11 DIAGNOSIS — J320 Chronic maxillary sinusitis: Secondary | ICD-10-CM | POA: Diagnosis not present

## 2017-10-11 DIAGNOSIS — R160 Hepatomegaly, not elsewhere classified: Secondary | ICD-10-CM | POA: Insufficient documentation

## 2017-10-11 MED ORDER — GADOBENATE DIMEGLUMINE 529 MG/ML IV SOLN
10.0000 mL | Freq: Once | INTRAVENOUS | Status: AC | PRN
  Administered 2017-10-11: 10 mL via INTRAVENOUS

## 2017-10-12 ENCOUNTER — Other Ambulatory Visit: Payer: Self-pay | Admitting: Pathology

## 2017-10-13 ENCOUNTER — Inpatient Hospital Stay: Payer: Medicare Other | Admitting: Oncology

## 2017-10-13 ENCOUNTER — Encounter (INDEPENDENT_AMBULATORY_CARE_PROVIDER_SITE_OTHER): Payer: Self-pay

## 2017-10-13 VITALS — BP 162/86 | HR 77 | Temp 98.8°F | Resp 20

## 2017-10-13 DIAGNOSIS — C7800 Secondary malignant neoplasm of unspecified lung: Secondary | ICD-10-CM

## 2017-10-13 DIAGNOSIS — G4700 Insomnia, unspecified: Secondary | ICD-10-CM | POA: Diagnosis not present

## 2017-10-13 DIAGNOSIS — C799 Secondary malignant neoplasm of unspecified site: Secondary | ICD-10-CM

## 2017-10-13 DIAGNOSIS — M545 Low back pain: Secondary | ICD-10-CM

## 2017-10-13 DIAGNOSIS — C7951 Secondary malignant neoplasm of bone: Secondary | ICD-10-CM

## 2017-10-13 DIAGNOSIS — R5383 Other fatigue: Secondary | ICD-10-CM | POA: Diagnosis not present

## 2017-10-13 DIAGNOSIS — Z7189 Other specified counseling: Secondary | ICD-10-CM

## 2017-10-13 DIAGNOSIS — Z5112 Encounter for antineoplastic immunotherapy: Secondary | ICD-10-CM | POA: Diagnosis not present

## 2017-10-13 DIAGNOSIS — C439 Malignant melanoma of skin, unspecified: Secondary | ICD-10-CM | POA: Diagnosis not present

## 2017-10-13 DIAGNOSIS — F419 Anxiety disorder, unspecified: Secondary | ICD-10-CM | POA: Diagnosis not present

## 2017-10-13 DIAGNOSIS — R918 Other nonspecific abnormal finding of lung field: Secondary | ICD-10-CM

## 2017-10-13 MED ORDER — MEGESTROL ACETATE 40 MG PO TABS: 40.0000 mg | ORAL_TABLET | Freq: Every day | ORAL | 2 refills | Status: DC

## 2017-10-13 MED ORDER — OXYCODONE HCL 10 MG PO TABS: 10.0000 mg | ORAL_TABLET | Freq: Two times a day (BID) | ORAL | 0 refills | Status: DC | PRN

## 2017-10-13 NOTE — Progress Notes (Signed)
Patient here today for biopsy results.  

## 2017-10-15 DIAGNOSIS — Z7189 Other specified counseling: Secondary | ICD-10-CM | POA: Insufficient documentation

## 2017-10-15 DIAGNOSIS — C799 Secondary malignant neoplasm of unspecified site: Secondary | ICD-10-CM | POA: Insufficient documentation

## 2017-10-15 DIAGNOSIS — C439 Malignant melanoma of skin, unspecified: Secondary | ICD-10-CM | POA: Insufficient documentation

## 2017-10-15 MED ORDER — LIDOCAINE-PRILOCAINE 2.5-2.5 % EX CREA: TOPICAL_CREAM | CUTANEOUS | 3 refills | Status: AC

## 2017-10-15 NOTE — Progress Notes (Signed)
[place]  [phone] [phone]  ID: Rolene Course OB: Sep 21, 1945  MR#: [id number]  JOI#:[id number]  Patient Care Team: Sofie Hartigan, MD as PCP - General (Family Medicine)  CHIEF COMPLAINT: Stage IV metastatic melanoma with liver, lung, and bony metastasis.  INTERVAL HISTORY: Patient returns to clinic today for further evaluation and initiation of ipilimumab and nivolumab.  Patient has had minimal PO intake over the past 24-48 hours which he blames on his recent port placement.  He continues to have a decreased performance status with increased weakness and fatigue.  He continues to have back pain radiating down his leg.  He is anxious and has insomnia. He has no neurologic complaints.  He denies any recent fevers or illnesses.  He has a fair appetite and denies weight loss.  He has no chest pain or shortness of breath.  He denies any nausea, vomiting, constipation, or diarrhea. He does not complain of melena or hematochezia today.  He has no urinary complaints, although admits to decreased urinary output.  Patient offers no further specific complaints today.  REVIEW OF SYSTEMS:   Review of Systems  Constitutional: Positive for malaise/fatigue. Negative for fever and weight loss.  Respiratory: Negative.  Negative for cough, hemoptysis and shortness of breath.   Cardiovascular: Negative.  Negative for chest pain and leg swelling.  Gastrointestinal: Negative.  Negative for abdominal pain, blood in stool and melena.  Genitourinary: Negative.  Negative for dysuria.  Musculoskeletal: Positive for back pain and joint pain.  Skin: Negative.  Negative for rash.  Neurological: Negative.  Negative for dizziness, sensory change, focal weakness and weakness.  Psychiatric/Behavioral: The patient is nervous/anxious and has insomnia.     As per HPI. Otherwise, a complete review of systems is negative.  PAST MEDICAL HISTORY: Past Medical History:    Diagnosis Date  . Anxiety   . Arthritis   . Bladder cancer ([place])   . Chronic kidney disease   . Depression   . Diabetes ([place])   . Dysrhythmia    atrial fib  . GERD (gastroesophageal reflux disease)   . Hypertension   . Restless leg syndrome   . Shortness of breath dyspnea    with exertion  . Skin cancer   . Sleep apnea    CPAP "once in awhile"    PAST SURGICAL HISTORY: Past Surgical History:  Procedure Laterality Date  . APPENDECTOMY    . JOINT REPLACEMENT Left    Total Knee Replacement  . KNEE ARTHROSCOPY Left   . PORTA CATH INSERTION N/A 10/19/2017   Procedure: PORTA CATH INSERTION;  Surgeon: Algernon Huxley, MD;  Location: [facility];  Service: Cardiovascular;  Laterality: N/A;  . REPLACEMENT TOTAL KNEE Left 2012   Dr. Little Ishikawa, Jr. [facility]  . TRANSURETHRAL RESECTION OF BLADDER TUMOR N/A 03/14/2016   Procedure: TRANSURETHRAL RESECTION OF BLADDER TUMOR (TURBT);  Surgeon: Hollice Espy, MD;  Location: [facility];  Service: Urology;  Laterality: N/A;    FAMILY HISTORY: Family History  Problem Relation Age of Onset  . Cancer Mother        Breast  . Heart failure Father   . Prostate cancer Neg Hx   . Bladder Cancer Neg Hx     ADVANCED DIRECTIVES (Y/N):  N  HEALTH MAINTENANCE: Social History   Tobacco Use  . Smoking status: Former Smoker    Packs/day: 1.00    Years: 20.00    Pack years: 20.00    Types: Cigarettes  Last attempt to quit: 1979    Years since quitting: 40.2  . Smokeless tobacco: Never Used  Substance Use Topics  . Alcohol use: Yes    Alcohol/week: 1.2 oz    Types: 2 Cans of beer per week    Comment: occassional  . Drug use: No     Colonoscopy:  PAP:  Bone density:  Lipid panel:  No Known Allergies  No current facility-administered medications for this visit.    No current outpatient medications on file.   Facility-Administered Medications Ordered in Other Visits  Medication Dose Route Frequency Provider Last Rate Last  Dose  . 0.9 %  sodium chloride infusion   Intravenous Continuous Gouru, Aruna, MD 75 mL/hr at 10/23/17 1549    . acetaminophen (TYLENOL) tablet 325 mg  325 mg Oral Q6H PRN Gouru, Aruna, MD      . ALPRAZolam Duanne Moron) tablet 0.5 mg  0.5 mg Oral QHS PRN Gouru, Aruna, MD      . aspirin EC tablet 81 mg  81 mg Oral QPM Gouru, Aruna, MD   81 mg at 10/23/17 2219  . atorvastatin (LIPITOR) tablet 40 mg  40 mg Oral q1800 Gouru, Aruna, MD   40 mg at 10/23/17 2218  . carvedilol (COREG) tablet 25 mg  25 mg Oral BID WC Gouru, Aruna, MD      . dexamethasone (DECADRON) injection 10 mg  10 mg Intravenous Once Amelia Jo, MD      . docusate sodium (COLACE) capsule 100 mg  100 mg Oral BID Gouru, Aruna, MD   100 mg at 10/23/17 2219  . enoxaparin (LOVENOX) injection 30 mg  30 mg Subcutaneous Q12H Gouru, Aruna, MD   30 mg at 10/23/17 2220  . ferrous sulfate tablet 325 mg  325 mg Oral Q breakfast Gouru, Aruna, MD      . FLUoxetine (PROZAC) capsule 10 mg  10 mg Oral Daily Gouru, Aruna, MD   10 mg at 10/23/17 2219  . gabapentin (NEURONTIN) capsule 300 mg  300 mg Oral QPM Gouru, Aruna, MD   300 mg at 10/23/17 2218  . glipiZIDE (GLUCOTROL) tablet 10 mg  10 mg Oral QAC breakfast Gouru, Aruna, MD      . insulin aspart (novoLOG) injection 0-5 Units  0-5 Units Subcutaneous QHS Nicholes Mango, MD   2 Units at 10/23/17 2219  . insulin aspart (novoLOG) injection 0-9 Units  0-9 Units Subcutaneous TID WC Gouru, Aruna, MD   1 Units at 10/23/17 1715  . megestrol (MEGACE) tablet 40 mg  40 mg Oral Daily Gouru, Aruna, MD   40 mg at 10/23/17 2219  . omega-3 acid ethyl esters (LOVAZA) capsule 1 g  1 g Oral Daily Gouru, Aruna, MD   1 g at 10/23/17 1715  . ondansetron (ZOFRAN) tablet 4 mg  4 mg Oral Q6H PRN Gouru, Aruna, MD       Or  . ondansetron (ZOFRAN) injection 4 mg  4 mg Intravenous Q6H PRN Gouru, Aruna, MD      . oxyCODONE (Oxy IR/ROXICODONE) immediate release tablet 10 mg  10 mg Oral BID PRN Gouru, Aruna, MD      . pantoprazole  (PROTONIX) EC tablet 40 mg  40 mg Oral Daily Gouru, Aruna, MD   40 mg at 10/23/17 2229  . patiromer Daryll Drown) packet 8.4 g  8.4 g Oral BID Gouru, Aruna, MD   8.4 g at 10/23/17 2219  . pneumococcal 23 valent vaccine (PNU-IMMUNE) injection 0.5 mL  0.5 mL Intramuscular  Tomorrow-1000 Gouru, Illene Silver, MD      . tamsulosin (FLOMAX) capsule 0.4 mg  0.4 mg Oral QPC supper Kolluru, Sarath, MD   0.4 mg at 10/23/17 2219    OBJECTIVE: There were no vitals filed for this visit.   There is no height or weight on file to calculate BMI.    ECOG FS:1 - Symptomatic but completely ambulatory  General: Well-developed, well-nourished, no acute distress.  Sitting in a wheelchair. Eyes: Pink conjunctiva, anicteric sclera. Lungs: Clear to auscultation bilaterally. Heart: Regular rate and rhythm. No rubs, murmurs, or gallops. Abdomen: Soft, nontender, nondistended. No organomegaly noted, normoactive bowel sounds. Musculoskeletal: No edema, cyanosis, or clubbing. Neuro: Alert, answering all questions appropriately. Cranial nerves grossly intact. Skin: No rashes or petechiae noted. Psych: Normal affect.   LAB RESULTS:  Lab Results  Component Value Date   NA 129 (L) 10/23/2017   K 5.4 (H) 10/23/2017   CL 99 (L) 10/23/2017   CO2 19 (L) 10/23/2017   GLUCOSE 211 (H) 10/23/2017   BUN 77 (H) 10/23/2017   CREATININE 4.79 (H) 10/23/2017   CALCIUM 7.8 (L) 10/23/2017   PROT 6.6 10/20/2017   ALBUMIN 2.6 (L) 10/20/2017   AST 29 10/20/2017   ALT 20 10/20/2017   ALKPHOS 251 (H) 10/20/2017   BILITOT 0.4 10/20/2017   GFRNONAA 11 (L) 10/23/2017   GFRAA 13 (L) 10/23/2017    Lab Results  Component Value Date   WBC 10.6 10/23/2017   NEUTROABS 6.4 10/20/2017   HGB 8.7 (L) 10/23/2017   HCT 27.1 (L) 10/23/2017   MCV 84.7 10/23/2017   PLT 299 10/23/2017     STUDIES: Dg Abdomen 1 View  Result Date: 10/23/2017 CLINICAL DATA:  Bladder carcinoma.  Pain and inability to urinate EXAM: ABDOMEN - 1 VIEW COMPARISON:   PET-CT September 21, 2017 FINDINGS: There is moderate stool throughout the colon. There is no bowel dilatation or air-fluid level to suggest bowel obstruction. No free air. There are probable phleboliths in the pelvis. Urinary bladder does not appear distended by radiography. IMPRESSION: No bowel obstruction or free air. Urinary bladder does not appear distended by radiography. Electronically Signed   By: Lowella Grip III M.D.   On: 10/23/2017 10:51   Mr Jeri Cos JI Contrast  Result Date: 10/11/2017 CLINICAL DATA:  Bladder cancer with metastatic disease to the liver, skeleton, and lungs. EXAM: MRI HEAD WITHOUT AND WITH CONTRAST TECHNIQUE: Multiplanar, multiecho pulse sequences of the brain and surrounding structures were obtained without and with intravenous contrast. CONTRAST:  67m MULTIHANCE GADOBENATE DIMEGLUMINE 529 MG/ML IV SOLN COMPARISON:  PET scan 09/21/2017. FINDINGS: Brain: No focal enhancing lesions are present to suggest metastatic disease the brain or meninges. Minimal white matter changes are within normal limits for age. No acute infarct, hemorrhage, or mass lesion is present. Ventricles are of normal size. The internal auditory canals are within normal limits bilaterally. The brainstem and cerebellum are normal. No significant extra-axial fluid collection is present. Vascular: Flow is present in the major intracranial arteries. Globes and orbits are at that Skull and upper cervical spine: The calvarium is intact. Focal lesions or enhancement are evident. The skull base is within normal limits. The craniocervical junction is normal. Sinuses/Orbits: Mild mucosal thickening is present along the floor of the maxillary sinuses bilaterally. No fluid levels are present. The paranasal sinuses and mastoid air cells are otherwise clear. Globes and orbits are within normal limits. IMPRESSION: 1. No evidence for metastatic disease to the brain or meninges. 2. Normal  MRI appearance of the brain for age.  3. Minimal maxillary sinus disease. Electronically Signed   By: San Morelle M.D.   On: 10/11/2017 11:54   Mr Lumbar Spine Wo Contrast  Result Date: 10/24/2017 CLINICAL DATA:  Difficulty urinating for 3 days. History of metastatic melanoma, bladder cancer and chronic kidney disease. EXAM: MRI LUMBAR SPINE WITHOUT CONTRAST TECHNIQUE: Multiplanar, multisequence MR imaging of the lumbar spine was performed. No intravenous contrast was administered. COMPARISON:  PET CT September 21, 2017 FINDINGS: SEGMENTATION: For the purposes of this report, the last well-formed intervertebral disc is reported as L5-S1. ALIGNMENT: Maintained lumbar lordosis. No malalignment. VERTEBRAE:30 mm low T1, bright STIR signal mass RIGHT L4 vertebral body extending to the pedicle with mild acute fracture, less than 15% height loss superior endplate associated with Schmorl's node. Low T1, bright STIR signal RIGHT T12 facet consistent with metastasis. Ovoid subcentimeter low T1, bright STIR signal inferior to LEFT L2 pedicle, likely extra osseous cyst. Remaining vertebral bodies intact. Congenital canal narrowing on the basis of foreshortened pedicles. Moderate to severe L5-S1 disc height loss with mild chronic discogenic endplate changes. Mild disc desiccation L2-3 through L5-S1. CONUS MEDULLARIS AND CAUDA EQUINA: Conus medullaris terminates at L1-2 and demonstrates normal morphology and signal characteristics. Cauda equina is normal. PARASPINAL AND OTHER SOFT TISSUES: T2 bright cyst LEFT kidney. Mild bright interstitial STIR signal bilateral paraspinal muscles seen with low-grade strain. 9 mm round cystic lesion RIGHT paraspinal soft tissues fluid fluid level. At least 4.8 cm mass contiguous with RIGHT sacroiliac joint undermining the ileo psoas muscles. DISC LEVELS: L1-2, L2-3: No disc bulge, canal stenosis nor neural foraminal narrowing. Mild facet arthropathy. L3-4: RIGHT L4 osseous metastasis with a 9 x 7 mm tumoral extension to  RIGHT lateral recess displacing the traversing RIGHT L4 nerve. Small broad-based disc bulge and mild facet arthropathy. Mild canal stenosis. Mild bilateral neural foraminal narrowing. L4-5: Small broad-based disc bulge, annular fissure. Mild facet arthropathy and ligamentum flavum redundancy. Moderate canal stenosis. Moderate RIGHT, mild LEFT neural foraminal narrowing. L5-S1: Moderate broad-based disc bulge. Mild facet arthropathy and ligamentum flavum redundancy. No canal stenosis. Mild to moderate bilateral neural foraminal narrowing. IMPRESSION: 1. RIGHT L4 metastasis with mild pathologic fracture, tumor expansion into epidural space resulting in traversing RIGHT L4 nerve impingement. RIGHT T12 facet metastasis. 2. 2 subcentimeter cystic paraspinal muscle masses concerning for metastatic deposits. At least 4.8 cm RIGHT pelvic metastasis. 3. 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 Moderate canal stenosis L4-5, mild canal stenosis L3-4. 4. Neural foraminal narrowing L3-4 through L5-S1: Moderate on the RIGHT at L4-5. Electronically Signed   By: Elon Alas M.D.   On: 10/24/2017 01:31   US Renal  Result Date: 10/23/2017 CLINICAL DATA:  Acute renal injury, history of metastatic bladder carcinoma EXAM: RENAL / URINARY TRACT ULTRASOUND COMPLETE COMPARISON:  07/14/2017. FINDINGS: Right Kidney: Length: 11.7 cm. Echogenicity within normal limits. No mass or hydronephrosis visualized. Left Kidney: Length: 12.1 cm. Cysts are noted within the left kidney. The largest of these measures 9.1 cm in greatest dimension. These are stable when compared with prior CT examination. No hydronephrosis is noted. Bladder: Appears normal for degree of bladder distention. Note is made of known lesions within the right lobe of the liver. IMPRESSION: Left renal cystic change. Stable masses in the liver are noted. Electronically Signed   By: Inez Catalina M.D.   On: 10/23/2017 15:24   US Biopsy  (liver)  Result Date: 10/06/2017 INDICATION: Concern for metastatic lung  cancer, now with multiple hypermetabolic liver lesions. Please perform ultrasound-guided liver lesion biopsy for tissue diagnostic purposes. EXAM: ULTRASOUND GUIDED LIVER LESION BIOPSY COMPARISON:  PET-CT - 09/21/2017 MEDICATIONS: None ANESTHESIA/SEDATION: Fentanyl 50 mcg IV; Versed 2 mg IV Total Moderate Sedation time:  14 Minutes. The patient's level of consciousness and vital signs were monitored continuously by radiology nursing throughout the procedure under my direct supervision. COMPLICATIONS: None immediate. PROCEDURE: Informed written consent was obtained from the patient after a discussion of the risks, benefits and alternatives to treatment. The patient understands and consents the procedure. A timeout was performed prior to the initiation of the procedure. Ultrasound scanning was performed of the right upper abdominal quadrant demonstrates multiple mixed echogenic lesions and masses within both the right and left lobes of the liver. An approximately 3.2 x 2.5 cm hypoechoic lesion/mass within the medial segment of left lobe liver correlating with the hypoattenuating lesion seen on preceding PET-CT image 153, series 4, was targeted for biopsy given lesion location and sonographic window. The procedure was planned. The right upper abdominal quadrant was prepped and draped in the usual sterile fashion. The overlying soft tissues were anesthetized with 1% lidocaine with epinephrine. A 17 gauge, 6.8 cm co-axial needle was advanced into a peripheral aspect of the lesion. This was followed by 5 core biopsies with an 18 gauge core device under direct ultrasound guidance. The coaxial needle tract was embolized with a small amount of Gel-Foam slurry and superficial hemostasis was obtained with manual compression. Post procedural scanning was negative for definitive area of hemorrhage or additional complication. A dressing was placed. The  patient tolerated the procedure well without immediate post procedural complication. IMPRESSION: Technically successful ultrasound guided core needle biopsy of indeterminate lesion/mass within the medial segment of the left lobe of the liver. Electronically Signed   By: [name] Mariscal M.D.   On: 10/06/2017 12:02    ASSESSMENT: Stage IV metastatic melanoma with liver, lung, and bony metastasis.  PLAN:    1. Stage IV metastatic melanoma with liver, lung, and bony metastasis: PET scan results from September 13, 2017 reviewed independently with widespread metastatic disease.  MRI of the brain negative for disease.  Liver biopsy consistent with metastatic melanoma. BRAF mutation has been ordered and is pending at time of dictation. After lengthy discussion with the patient and his family, he wishes to proceed with palliative combination immunotherapy using nivolumab and ipilimumab.  Plan giving 4 cycles every 3 weeks of combination therapy and then transition to single agent maintenance nivolumab every 2 weeks until progression of disease.  Proceed with cycle return to clinic in 1 week 1 of 4 of nivolumab and ipilimumab.  For repeat laboratory work and further evaluation and then in 3 weeks for consideration of cycle 2. 2.  Renal insufficiency: Possibly secondary to poor PO intake and dehydration.  Patient received an additional 1 L of IV fluids today.  2.  Anxiety: Continue Xanax of 0.5 mg twice per day as needed. 3.  Pain: Continue oxycodone 10 mg tabs as needed.  Patient was previously given a referral to radiation oncology and has an appointment with them next week.   Approximately 30 minutes was spent in discussion of which greater than 50% was consultation.  Patient expressed understanding and was in agreement with this plan. He also understands that He can call clinic at any time with any questions, concerns, or complaints.   Cancer Staging Malignant melanoma, metastatic ([place]) Staging form: Melanoma  of the Skin, AJCC 8th  Edition - Clinical stage from 10/15/2017: Stage IV (cTX, cNX, pM1c(1)) - Signed by Lloyd Huger, MD on 10/15/2017   Lloyd Huger, MD   10/24/2017 2:01 AM

## 2017-10-15 NOTE — Progress Notes (Signed)
START ON PATHWAY REGIMEN - Melanoma   Nivolumab 1 mg/kg + Ipilimumab 3 mg/kg q21 Days x 4 Doses:   A cycle is every 21 days:     Nivolumab      Ipilimumab   **Always confirm dose/schedule in your pharmacy ordering system**    Nivolumab 240 mg q14 Days:   A cycle is every 14 days:     Nivolumab   **Always confirm dose/schedule in your pharmacy ordering system**    Patient Characteristics: Stage IV, Unresectable, Symptomatic, First Line, BRAF V600 Wild Type / BRAF V600 Results Pending or Unknown, Candidate for Immunotherapy Disease Subtype: Unknown Current Disease Status: Distant Metastases AJCC 8 Stage Grouping: IV AJCC T Category: TX AJCC N Category: NX AJCC M Category: M1c Mutation Status: Awaiting BRAF V600 Results Metastatic Disease Type: Symptomatic Would you be surprised if this patient died  in the next year<= I would NOT be surprised if this patient died in the next year Line of Therapy: First Line Immunotherapy Candidate Status: Candidate for Immunotherapy Intent of Therapy: Non-Curative / Palliative Intent, Discussed with Patient

## 2017-10-16 ENCOUNTER — Other Ambulatory Visit (INDEPENDENT_AMBULATORY_CARE_PROVIDER_SITE_OTHER): Payer: Self-pay | Admitting: Vascular Surgery

## 2017-10-18 MED ORDER — CEFAZOLIN SODIUM-DEXTROSE 2-4 GM/100ML-% IV SOLN
2.0000 g | Freq: Once | INTRAVENOUS | Status: AC
  Administered 2017-10-19: 2 g via INTRAVENOUS

## 2017-10-19 ENCOUNTER — Encounter: Payer: Self-pay | Admitting: *Deleted

## 2017-10-19 ENCOUNTER — Other Ambulatory Visit: Payer: Self-pay

## 2017-10-19 ENCOUNTER — Encounter
Admission: RE | Disposition: A | Discharge status: Home / Self Care | Payer: Self-pay | Source: Ambulatory Visit | Attending: Vascular Surgery

## 2017-10-19 ENCOUNTER — Ambulatory Visit
Admission: RE | Admit: 2017-10-19 | Discharge: 2017-10-19 | Disposition: A | Discharge status: Home / Self Care | Payer: Medicare Other | Source: Ambulatory Visit | Attending: Radiation Oncology | Admitting: Radiation Oncology

## 2017-10-19 ENCOUNTER — Ambulatory Visit
Admission: RE | Admit: 2017-10-19 | Discharge: 2017-10-19 | Disposition: A | Discharge status: Home / Self Care | Payer: Medicare Other | Source: Ambulatory Visit | Attending: Vascular Surgery | Admitting: Vascular Surgery

## 2017-10-19 VITALS — BP 138/81 | HR 88 | Temp 99.0°F | Resp 12 | Ht 72.0 in

## 2017-10-19 DIAGNOSIS — C787 Secondary malignant neoplasm of liver and intrahepatic bile duct: Secondary | ICD-10-CM | POA: Diagnosis not present

## 2017-10-19 DIAGNOSIS — F419 Anxiety disorder, unspecified: Secondary | ICD-10-CM | POA: Insufficient documentation

## 2017-10-19 DIAGNOSIS — N189 Chronic kidney disease, unspecified: Secondary | ICD-10-CM | POA: Insufficient documentation

## 2017-10-19 DIAGNOSIS — K219 Gastro-esophageal reflux disease without esophagitis: Secondary | ICD-10-CM | POA: Insufficient documentation

## 2017-10-19 DIAGNOSIS — C439 Malignant melanoma of skin, unspecified: Secondary | ICD-10-CM

## 2017-10-19 DIAGNOSIS — Z87891 Personal history of nicotine dependence: Secondary | ICD-10-CM | POA: Diagnosis not present

## 2017-10-19 DIAGNOSIS — G4730 Sleep apnea, unspecified: Secondary | ICD-10-CM | POA: Insufficient documentation

## 2017-10-19 DIAGNOSIS — C7951 Secondary malignant neoplasm of bone: Secondary | ICD-10-CM | POA: Insufficient documentation

## 2017-10-19 DIAGNOSIS — F329 Major depressive disorder, single episode, unspecified: Secondary | ICD-10-CM | POA: Diagnosis not present

## 2017-10-19 DIAGNOSIS — Z7984 Long term (current) use of oral hypoglycemic drugs: Secondary | ICD-10-CM | POA: Insufficient documentation

## 2017-10-19 DIAGNOSIS — Z79899 Other long term (current) drug therapy: Secondary | ICD-10-CM | POA: Insufficient documentation

## 2017-10-19 DIAGNOSIS — Z7982 Long term (current) use of aspirin: Secondary | ICD-10-CM | POA: Insufficient documentation

## 2017-10-19 DIAGNOSIS — G2581 Restless legs syndrome: Secondary | ICD-10-CM | POA: Diagnosis not present

## 2017-10-19 DIAGNOSIS — I129 Hypertensive chronic kidney disease with stage 1 through stage 4 chronic kidney disease, or unspecified chronic kidney disease: Secondary | ICD-10-CM | POA: Insufficient documentation

## 2017-10-19 HISTORY — PX: PORTA CATH INSERTION: CATH118285

## 2017-10-19 LAB — GLUCOSE, CAPILLARY: Glucose-Capillary: 203 mg/dL — ABNORMAL HIGH (ref 65–99)

## 2017-10-19 SURGERY — PORTA CATH INSERTION
Anesthesia: Moderate Sedation

## 2017-10-19 MED ORDER — SODIUM CHLORIDE 0.9 % IV SOLN
INTRAVENOUS | Status: DC
  Administered 2017-10-19: 09:00:00 via INTRAVENOUS

## 2017-10-19 MED ORDER — SODIUM CHLORIDE 0.9 % IR SOLN
Freq: Once | Status: DC
  Filled 2017-10-19 (×3): qty 2

## 2017-10-19 MED ORDER — HYDROMORPHONE HCL 1 MG/ML IJ SOLN: 1.0000 mg | Freq: Once | INTRAMUSCULAR | Status: DC | PRN

## 2017-10-19 MED ORDER — ONDANSETRON HCL 4 MG/2ML IJ SOLN: 4.0000 mg | Freq: Four times a day (QID) | INTRAMUSCULAR | Status: DC | PRN

## 2017-10-19 MED ORDER — CEFAZOLIN SODIUM-DEXTROSE 2-4 GM/100ML-% IV SOLN
INTRAVENOUS | Status: AC
  Filled 2017-10-19: qty 100

## 2017-10-19 MED ORDER — HEPARIN (PORCINE) IN NACL 2-0.9 UNIT/ML-% IJ SOLN
INTRAMUSCULAR | Status: AC
  Filled 2017-10-19: qty 500

## 2017-10-19 MED ORDER — FENTANYL CITRATE (PF) 100 MCG/2ML IJ SOLN
INTRAMUSCULAR | Status: AC
  Filled 2017-10-19: qty 2

## 2017-10-19 MED ORDER — FENTANYL CITRATE (PF) 100 MCG/2ML IJ SOLN
INTRAMUSCULAR | Status: DC | PRN
  Administered 2017-10-19 (×2): 50 ug via INTRAVENOUS

## 2017-10-19 MED ORDER — LIDOCAINE-EPINEPHRINE (PF) 1 %-1:200000 IJ SOLN
INTRAMUSCULAR | Status: AC
  Filled 2017-10-19: qty 30

## 2017-10-19 MED ORDER — MIDAZOLAM HCL 2 MG/2ML IJ SOLN
INTRAMUSCULAR | Status: DC | PRN
  Administered 2017-10-19 (×2): 2 mg via INTRAVENOUS

## 2017-10-19 MED ORDER — MIDAZOLAM HCL 5 MG/5ML IJ SOLN
INTRAMUSCULAR | Status: AC
  Filled 2017-10-19: qty 5

## 2017-10-19 SURGICAL SUPPLY — 9 items
DERMABOND ADVANCED (GAUZE/BANDAGES/DRESSINGS) ×2
DERMABOND ADVANCED .7 DNX12 (GAUZE/BANDAGES/DRESSINGS) ×1 IMPLANT
KIT PORT POWER 8FR ISP CVUE (Port) ×3 IMPLANT
PACK ANGIOGRAPHY (CUSTOM PROCEDURE TRAY) ×3 IMPLANT
PAD GROUND ADULT SPLIT (MISCELLANEOUS) ×3 IMPLANT
PENCIL ELECTRO HAND CTR (MISCELLANEOUS) ×3 IMPLANT
SUT MNCRL AB 4-0 PS2 18 (SUTURE) ×3 IMPLANT
SUT PROLENE 0 CT 1 30 (SUTURE) ×3 IMPLANT
SUTURE VIC 3-0 (SUTURE) ×3 IMPLANT

## 2017-10-19 NOTE — Discharge Instructions (Signed)
Implanted Port Home Guide An implanted port is a type of central line that is placed under the skin. Central lines are used to provide IV access when treatment or nutrition needs to be given through a person's veins. Implanted ports are used for long-term IV access. An implanted port may be placed because:  You need IV medicine that would be irritating to the small veins in your hands or arms.  You need long-term IV medicines, such as antibiotics.  You need IV nutrition for a long period.  You need frequent blood draws for lab tests.  You need dialysis.  Implanted ports are usually placed in the chest area, but they can also be placed in the upper arm, the abdomen, or the leg. An implanted port has two main parts:  Reservoir. The reservoir is round and will appear as a small, raised area under your skin. The reservoir is the part where a needle is inserted to give medicines or draw blood.  Catheter. The catheter is a thin, flexible tube that extends from the reservoir. The catheter is placed into a large vein. Medicine that is inserted into the reservoir goes into the catheter and then into the vein.  How will I care for my incision site? Do not get the incision site wet. Bathe or shower as directed by your health care provider. How is my port accessed? Special steps must be taken to access the port:  Before the port is accessed, a numbing cream can be placed on the skin. This helps numb the skin over the port site.  Your health care provider uses a sterile technique to access the port. ? Your health care provider must put on a mask and sterile gloves. ? The skin over your port is cleaned carefully with an antiseptic and allowed to dry. ? The port is gently pinched between sterile gloves, and a needle is inserted into the port.  Only "non-coring" port needles should be used to access the port. Once the port is accessed, a blood return should be checked. This helps ensure that the port  is in the vein and is not clogged.  If your port needs to remain accessed for a constant infusion, a clear (transparent) bandage will be placed over the needle site. The bandage and needle will need to be changed every week, or as directed by your health care provider.  Keep the bandage covering the needle clean and dry. Do not get it wet. Follow your health care provider's instructions on how to take a shower or bath while the port is accessed.  If your port does not need to stay accessed, no bandage is needed over the port.  What is flushing? Flushing helps keep the port from getting clogged. Follow your health care provider's instructions on how and when to flush the port. Ports are usually flushed with saline solution or a medicine called heparin. The need for flushing will depend on how the port is used.  If the port is used for intermittent medicines or blood draws, the port will need to be flushed: ? After medicines have been given. ? After blood has been drawn. ? As part of routine maintenance.  If a constant infusion is running, the port may not need to be flushed.  How long will my port stay implanted? The port can stay in for as long as your health care provider thinks it is needed. When it is time for the port to come out, surgery will be   done to remove it. The procedure is similar to the one performed when the port was put in. When should I seek immediate medical care? When you have an implanted port, you should seek immediate medical care if:  You notice a bad smell coming from the incision site.  You have swelling, redness, or drainage at the incision site.  You have more swelling or pain at the port site or the surrounding area.  You have a fever that is not controlled with medicine.  This information is not intended to replace advice given to you by your health care provider. Make sure you discuss any questions you have with your health care provider. Document  Released: 07/11/2005 Document Revised: 12/17/2015 Document Reviewed: 03/18/2013 Elsevier Interactive Patient Education  2017 [employer]  

## 2017-10-19 NOTE — Consult Note (Signed)
NEW PATIENT EVALUATION  Name: Tyler Henderson  MRN: [id number]  Date:   10/19/2017     DOB: 13-Aug-1945   This 72 y.o. 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  REFERRING PHYSICIAN: Sofie Hartigan, MD  CHIEF COMPLAINT:  Chief Complaint  Patient presents with  . Lung Cancer    DIAGNOSIS: There were no encounter diagnoses.   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 He is beinghaving difficulty ambulating with pain in his righthip and referred down to his right lower extremity. PET CT scan demonstrates widespread metastatic disease. He has disease in his L5 vertebral body as well as right SI joint. This is around the area the pain is localized to. I been asked to evaluate him for possible palliative radiation therapy. He does have aleft lung mass by PET CT criteria again consistent with metastatic disease although he's having no pulmonary symptoms no cough hemoptysis or chest tightness. He does have shortness of breath of unknown etiology.  PLANNED TREATMENT REGIMEN: palliative ration therapy to lower L-spine and SI joints  PAST MEDICAL HISTORY:  has a past medical history of Anxiety, Arthritis, Bladder cancer ([place]), Chronic kidney disease, Depression, Diabetes ([place]), Dysrhythmia, GERD (gastroesophageal reflux disease), Hypertension, Restless leg syndrome, Shortness of breath dyspnea, Skin cancer, and Sleep apnea.    PAST SURGICAL HISTORY:  Past Surgical History:  Procedure Laterality Date  . APPENDECTOMY    . JOINT REPLACEMENT Left    Total Knee Replacement  . KNEE ARTHROSCOPY Left   . REPLACEMENT TOTAL KNEE Left 2012   Dr. Little Ishikawa, Jr. [facility]  . TRANSURETHRAL RESECTION OF BLADDER TUMOR N/A 03/14/2016   Procedure: TRANSURETHRAL RESECTION OF  BLADDER TUMOR (TURBT);  Surgeon: Hollice Espy, MD;  Location: [facility] ORS;  Service: Urology;  Laterality: N/A;    FAMILY HISTORY: family history includes Cancer in his mother; Heart failure in his father.  SOCIAL HISTORY:  reports that he quit smoking about 40 years ago. His smoking use included cigarettes. He has a 20.00 pack-year smoking history. He has never used smokeless tobacco. He reports that he drinks about 1.2 oz of alcohol per week. He reports that he does not use drugs.  ALLERGIES: Patient has no known allergies.  MEDICATIONS:  Current Outpatient Medications  Medication Sig Dispense Refill  . ALPRAZolam (XANAX) 0.5 MG tablet Take 1 tablet (0.5 mg total) by mouth at bedtime as needed for anxiety. (Patient taking differently: Take 0.5 mg by mouth daily as needed for anxiety. ) 30 tablet 0  . aspirin EC 81 MG tablet Take 81 mg by mouth every evening.     Marland Kitchen atorvastatin (LIPITOR) 40 MG tablet Take 40 mg by mouth in the evening    . carvedilol (COREG) 25 MG tablet Take 25 mg by mouth twice daily    . docusate sodium (COLACE) 100 MG capsule Take 1 capsule (100 mg total) by mouth 2 (two) times daily. (Patient taking differently: Take 100-200 mg by mouth daily as needed for moderate constipation. ) 60 capsule 0  . ferrous sulfate (SLOW RELEASE IRON) 160 (50 Fe) MG TBCR SR tablet Take 160 mg by mouth 2 (two) times daily.     Marland Kitchen FLUoxetine (PROZAC) 10 MG capsule Take 10 mg by mouth daily.     . furosemide (LASIX) 20 MG tablet  Take 20-40 mg by mouth daily as needed for fluid or edema.     . gabapentin (NEURONTIN) 300 MG capsule Take 300 mg by mouth every evening.     Marland Kitchen glipiZIDE (GLUCOTROL) 10 MG tablet Take 10 mg by mouth in the evening    . lidocaine-prilocaine (EMLA) cream Apply to affected area once 30 g 3  . lisinopril (PRINIVIL,ZESTRIL) 40 MG tablet Take 40 mg by mouth in the evening    . megestrol (MEGACE) 40 MG tablet Take 1 tablet (40 mg total) by mouth daily. 30 tablet 2  .  Multiple Vitamin (MULTI-VITAMINS) TABS Take 1 tablet by mouth daily. Alive Mens Multivitamin    . Omega-3 Fatty Acids (FISH OIL PO) Take 1 capsule by mouth 2 (two) times daily.     Marland Kitchen omeprazole (PRILOSEC) 20 MG capsule Take 20 mg by mouth in the evening    . Oxycodone HCl 10 MG TABS Take 1 tablet (10 mg total) by mouth 2 (two) times daily as needed. (Patient taking differently: Take 10 mg by mouth 2 (two) times daily as needed (for pain). ) 60 tablet 0  . patiromer (VELTASSA) 8.4 g packet Take 8.4 g by mouth 2 (two) times daily.    . pioglitazone (ACTOS) 45 MG tablet Take 45 mg by mouth in the morning     No current facility-administered medications for this encounter.     ECOG PERFORMANCE STATUS:  2 - Symptomatic, <50% confined to bed  REVIEW OF SYSTEMS: except for the pain in his lower back fatigue and shortness of breath Patient denies any weight loss, fatigue, weakness, fever, chills or night sweats. Patient denies any loss of vision, blurred vision. Patient denies any ringing  of the ears or hearing loss. No irregular heartbeat. Patient denies heart murmur or history of fainting. Patient denies any chest pain or pain radiating to her upper extremities. Patient denies any shortness of breath, difficulty breathing at night, cough or hemoptysis. Patient denies any swelling in the lower legs. Patient denies any nausea vomiting, vomiting of blood, or coffee ground material in the vomitus. Patient denies any stomach pain. Patient states has had normal bowel movements no significant constipation or diarrhea. Patient denies any dysuria, hematuria or significant nocturia. Patient denies any problems walking, swelling in the joints or loss of balance. Patient denies any skin changes, loss of hair or loss of weight. Patient denies any excessive worrying or anxiety or significant depression. Patient denies any problems with insomnia. Patient denies excessive thirst, polyuria, polydipsia. Patient denies any  swollen glands, patient denies easy bruising or easy bleeding. Patient denies any recent infections, allergies or URI. Patient "s visual fields have not changed significantly in recent time.    PHYSICAL EXAM: BP 138/81 (BP Location: Right Wrist, Patient Position: Sitting, Cuff Size: Small)   Pulse 88   Temp 99 F (37.2 C) (Tympanic)   Resp 12   Ht 6' (1.829 m)   BMI 44.35 kg/m  Well-developed large obese male wheelchair-bound in NAD. Range of motion of his lower extremities does not elicit pain. He has decreased strength in his right lower extremity and proprioception is decreased in both lower extremities. He has point tenderness in the L5 region of his lumbar spine. Well-developed well-nourished patient in NAD. HEENT reveals PERLA, EOMI, discs not visualized.  Oral cavity is clear. No oral mucosal lesions are identified. Neck is clear without evidence of cervical or supraclavicular adenopathy. Lungs are clear to A&P. Cardiac examination is essentially unremarkable with  regular rate and rhythm without murmur rub or thrill. Abdomen is benign with no organomegaly or masses noted. Motor sensory and DTR levels are equal and symmetric in the upper and lower extremities. Cranial nerves II through XII are grossly intact. Proprioception is intact. No peripheral adenopathy or edema is identified. No motor or sensory levels are noted. Crude visual fields are within normal range.  LABORATORY DATA: pathology reports reviewed    RADIOLOGY RESULTS:CT scan PET CT scans reviewed   IMPRESSION: widespread metastatic melanomain 72 year old male withlumbar spine SI joint involvement for palliative radiation therapy.  PLAN: at this time I to go ahead with palliative ration therapy to L5 SI joints. Would plan on delivering 3000 cGy in 10 fractions. Risks and benefits of treatment including possible diarrhea fatigue alteration of blood counts skin reaction possible some increased lower urinary tract symptoms all  were discussed in detail with the patient and his family. I have personally set up and ordered CT simulation for first thing next week. He is starting immunotherapy under medical oncology's direction. He has recently had a Port-A-Cath placed and will have his first infusion tomorrow.There will be extra effort by both professional staff as well as technical staff to coordinate and manage concurrent chemoradiation and ensuing side effects during his treatments.patient family seem to comprehend my treatment plan well. I will reserve palliative radiation therapy for any other areas of bony metastatic disease causing significant pain and possibly his lung lesion should that present with any symptomatology.  I would like to take this opportunity to thank you for allowing me to participate in the care of your patient.Noreene Filbert, MD

## 2017-10-19 NOTE — H&P (Signed)
[facility] History & Physical Update  The patient was interviewed and re-examined.  The patient's previous History and Physical has been reviewed and is unchanged.  There is no change in the plan of care. We plan to proceed with the scheduled procedure.  Leotis Pain, MD  10/19/2017, 9:16 AM

## 2017-10-19 NOTE — Op Note (Signed)
      [facility]       Operative Note  Date: 10/19/2017  Preoperative diagnosis:  1. Melanoma  Postoperative diagnosis:  Same as above  Procedures: #1. Ultrasound guidance for vascular access to the right internal jugular vein. #2. Fluoroscopic guidance for placement of catheter. #3. Placement of CT compatible Port-A-Cath, right internal jugular vein.  Surgeon: Leotis Pain, MD.   Anesthesia: Local with moderate conscious sedation for approximately 20j minutes using 4 mg of Versed and 100 mcg of Fentanyl  Fluoroscopy time: less than 1 minute  Contrast used: 0  Estimated blood loss: 5 cc  Indication for the procedure:  The patient is a 72 y.o.male with melanoma.  The patient needs a Port-A-Cath for durable venous access, chemotherapy, lab draws, and CT scans. We are asked to place this. Risks and benefits were discussed and informed consent was obtained.  Description of procedure: The patient was brought to the vascular and interventional radiology suite.  Moderate conscious sedation was administered throughout the procedure during a face to face encounter with the patient with my supervision of the RN administering medicines and monitoring the patient's vital signs, pulse oximetry, telemetry and mental status throughout from the start of the procedure until the patient was taken to the recovery room. The right neck chest and shoulder were sterilely prepped and draped, and a sterile surgical field was created. Ultrasound was used to help visualize a patent right internal jugular vein. This was then accessed under direct ultrasound guidance without difficulty with the Seldinger needle and a permanent image was recorded. A J-wire was placed. After skin nick and dilatation, the peel-away sheath was then placed over the wire. I then anesthetized an area under the clavicle approximately 1-2 fingerbreadths. A transverse incision was created and an inferior pocket was created  with electrocautery and blunt dissection. The port was then brought onto the field, placed into the pocket and secured to the chest wall with 2 Prolene sutures. The catheter was connected to the port and tunneled from the subclavicular incision to the access site. Fluoroscopic guidance was then used to cut the catheter to an appropriate length. The catheter was then placed through the peel-away sheath and the peel-away sheath was removed. The catheter tip was parked in excellent location under fluorocoscopic guidance in the mid SVC. The pocket was then irrigated with antibiotic impregnated saline and the wound was closed with a running 3-0 Vicryl and a 4-0 Monocryl. The access incision was closed with a single 4-0 Monocryl. The Huber needle was used to withdraw blood and flush the port with heparinized saline. Dermabond was then placed as a dressing. The patient tolerated the procedure well and was taken to the recovery room in stable condition.   Leotis Pain 10/19/2017 10:20 AM   This note was created with Dragon Medical transcription system. Any errors in dictation are purely unintentional.

## 2017-10-20 ENCOUNTER — Inpatient Hospital Stay: Payer: Medicare Other

## 2017-10-20 ENCOUNTER — Inpatient Hospital Stay (HOSPITAL_BASED_OUTPATIENT_CLINIC_OR_DEPARTMENT_OTHER): Payer: Medicare Other | Admitting: Oncology

## 2017-10-20 ENCOUNTER — Ambulatory Visit: Payer: PRIVATE HEALTH INSURANCE

## 2017-10-20 VITALS — BP 111/70 | HR 74 | Temp 98.6°F | Resp 18 | Wt 326.0 lb

## 2017-10-20 DIAGNOSIS — C787 Secondary malignant neoplasm of liver and intrahepatic bile duct: Secondary | ICD-10-CM | POA: Diagnosis not present

## 2017-10-20 DIAGNOSIS — G4700 Insomnia, unspecified: Secondary | ICD-10-CM

## 2017-10-20 DIAGNOSIS — C439 Malignant melanoma of skin, unspecified: Secondary | ICD-10-CM

## 2017-10-20 DIAGNOSIS — C7800 Secondary malignant neoplasm of unspecified lung: Secondary | ICD-10-CM | POA: Diagnosis not present

## 2017-10-20 DIAGNOSIS — R531 Weakness: Secondary | ICD-10-CM

## 2017-10-20 DIAGNOSIS — M545 Low back pain: Secondary | ICD-10-CM

## 2017-10-20 DIAGNOSIS — N139 Obstructive and reflux uropathy, unspecified: Secondary | ICD-10-CM | POA: Diagnosis not present

## 2017-10-20 DIAGNOSIS — C7951 Secondary malignant neoplasm of bone: Secondary | ICD-10-CM

## 2017-10-20 DIAGNOSIS — C799 Secondary malignant neoplasm of unspecified site: Secondary | ICD-10-CM

## 2017-10-20 DIAGNOSIS — R5383 Other fatigue: Secondary | ICD-10-CM

## 2017-10-20 DIAGNOSIS — E860 Dehydration: Secondary | ICD-10-CM | POA: Diagnosis not present

## 2017-10-20 DIAGNOSIS — N289 Disorder of kidney and ureter, unspecified: Secondary | ICD-10-CM

## 2017-10-20 LAB — COMPREHENSIVE METABOLIC PANEL
ALT: 20 U/L (ref 17–63)
AST: 29 U/L (ref 15–41)
Albumin: 2.6 g/dL — ABNORMAL LOW (ref 3.5–5.0)
Alkaline Phosphatase: 251 U/L — ABNORMAL HIGH (ref 38–126)
Anion gap: 11 (ref 5–15)
BILIRUBIN TOTAL: 0.4 mg/dL (ref 0.3–1.2)
BUN: 49 mg/dL — AB (ref 6–20)
CHLORIDE: 103 mmol/L (ref 101–111)
CO2: 22 mmol/L (ref 22–32)
CREATININE: 2.78 mg/dL — AB (ref 0.61–1.24)
Calcium: 8.5 mg/dL — ABNORMAL LOW (ref 8.9–10.3)
GFR calc Af Amer: 24 mL/min — ABNORMAL LOW (ref >60)
GFR, EST NON AFRICAN AMERICAN: 21 mL/min — AB (ref >60)
Glucose, Bld: 234 mg/dL — ABNORMAL HIGH (ref 65–99)
Potassium: 4.4 mmol/L (ref 3.5–5.1)
Sodium: 136 mmol/L (ref 135–145)
TOTAL PROTEIN: 6.6 g/dL (ref 6.5–8.1)

## 2017-10-20 LAB — CBC WITH DIFFERENTIAL/PLATELET
BASOS ABS: 0 10*3/uL (ref 0–0.1)
BASOS PCT: 0 %
EOS ABS: 0.1 10*3/uL (ref 0–0.7)
EOS PCT: 1 %
HCT: 26.5 % — ABNORMAL LOW (ref 40.0–52.0)
HEMOGLOBIN: 8.6 g/dL — AB (ref 13.0–18.0)
LYMPHS ABS: 0.8 10*3/uL — AB (ref 1.0–3.6)
Lymphocytes Relative: 10 %
MCH: 27.5 pg (ref 26.0–34.0)
MCHC: 32.5 g/dL (ref 32.0–36.0)
MCV: 84.7 fL (ref 80.0–100.0)
Monocytes Absolute: 0.7 10*3/uL (ref 0.2–1.0)
Monocytes Relative: 9 %
Neutro Abs: 6.4 10*3/uL (ref 1.4–6.5)
Neutrophils Relative %: 80 %
PLATELETS: 371 10*3/uL (ref 150–440)
RBC: 3.13 MIL/uL — AB (ref 4.40–5.90)
RDW: 13.8 % (ref 11.5–14.5)
WBC: 7.9 10*3/uL (ref 3.8–10.6)

## 2017-10-20 MED ORDER — SODIUM CHLORIDE 0.9 % IV SOLN
Freq: Once | INTRAVENOUS | Status: AC
  Administered 2017-10-20: 10:00:00 via INTRAVENOUS
  Filled 2017-10-20: qty 1000

## 2017-10-20 MED ORDER — SODIUM CHLORIDE 0.9 % IV SOLN
450.0000 mg | Freq: Once | INTRAVENOUS | Status: AC
  Administered 2017-10-20: 450 mg via INTRAVENOUS
  Filled 2017-10-20: qty 80

## 2017-10-20 MED ORDER — SODIUM CHLORIDE 0.9% FLUSH
10.0000 mL | INTRAVENOUS | Status: DC | PRN
  Administered 2017-10-20: 10 mL
  Filled 2017-10-20: qty 10

## 2017-10-20 MED ORDER — SODIUM CHLORIDE 0.9 % IV SOLN
Freq: Once | INTRAVENOUS | Status: AC
  Administered 2017-10-20: 11:00:00 via INTRAVENOUS
  Filled 2017-10-20: qty 1000

## 2017-10-20 MED ORDER — DIPHENHYDRAMINE HCL 50 MG/ML IJ SOLN
25.0000 mg | Freq: Once | INTRAMUSCULAR | Status: AC
  Administered 2017-10-20: 25 mg via INTRAVENOUS
  Filled 2017-10-20: qty 1

## 2017-10-20 MED ORDER — SODIUM CHLORIDE 0.9 % IV SOLN
140.0000 mg | Freq: Once | INTRAVENOUS | Status: AC
  Administered 2017-10-20: 140 mg via INTRAVENOUS
  Filled 2017-10-20: qty 10

## 2017-10-20 MED ORDER — FAMOTIDINE IN NACL 20-0.9 MG/50ML-% IV SOLN
20.0000 mg | Freq: Once | INTRAVENOUS | Status: AC
  Administered 2017-10-20: 20 mg via INTRAVENOUS
  Filled 2017-10-20: qty 50

## 2017-10-20 MED ORDER — HEPARIN SOD (PORK) LOCK FLUSH 100 UNIT/ML IV SOLN
500.0000 [IU] | Freq: Once | INTRAVENOUS | Status: AC | PRN
  Administered 2017-10-20: 500 [IU]
  Filled 2017-10-20: qty 5

## 2017-10-20 NOTE — Progress Notes (Signed)
MD gave verbal orders that it is okay to give Opdivo and Yervoy today despite BUN and creatinine levels today. Gave 1 liter of NS prior to treatment for low BP this am.

## 2017-10-20 NOTE — Progress Notes (Signed)
Patient seen in infusion.  

## 2017-10-21 LAB — THYROID PANEL WITH TSH
FREE THYROXINE INDEX: 2.3 (ref 1.2–4.9)
T3 Uptake Ratio: 28 % (ref 24–39)
T4, Total: 8.1 ug/dL (ref 4.5–12.0)
TSH: 3.07 u[IU]/mL (ref 0.450–4.500)

## 2017-10-23 ENCOUNTER — Inpatient Hospital Stay: Payer: Medicare Other

## 2017-10-23 ENCOUNTER — Ambulatory Visit: Payer: PRIVATE HEALTH INSURANCE

## 2017-10-23 ENCOUNTER — Telehealth: Payer: Self-pay | Admitting: *Deleted

## 2017-10-23 ENCOUNTER — Inpatient Hospital Stay
Admission: EM | Admit: 2017-10-23 | Discharge: 2017-10-26 | DRG: 699 | Disposition: A | Discharge status: Home Health | Payer: Medicare Other | Attending: Internal Medicine | Admitting: Internal Medicine

## 2017-10-23 ENCOUNTER — Emergency Department: Payer: Medicare Other

## 2017-10-23 ENCOUNTER — Encounter: Payer: Self-pay | Admitting: Emergency Medicine

## 2017-10-23 ENCOUNTER — Other Ambulatory Visit: Payer: Self-pay

## 2017-10-23 DIAGNOSIS — Z87891 Personal history of nicotine dependence: Secondary | ICD-10-CM

## 2017-10-23 DIAGNOSIS — Z23 Encounter for immunization: Secondary | ICD-10-CM | POA: Diagnosis not present

## 2017-10-23 DIAGNOSIS — Z79891 Long term (current) use of opiate analgesic: Secondary | ICD-10-CM

## 2017-10-23 DIAGNOSIS — C799 Secondary malignant neoplasm of unspecified site: Secondary | ICD-10-CM | POA: Diagnosis not present

## 2017-10-23 DIAGNOSIS — I4891 Unspecified atrial fibrillation: Secondary | ICD-10-CM | POA: Diagnosis present

## 2017-10-23 DIAGNOSIS — N184 Chronic kidney disease, stage 4 (severe): Secondary | ICD-10-CM | POA: Diagnosis present

## 2017-10-23 DIAGNOSIS — C7989 Secondary malignant neoplasm of other specified sites: Secondary | ICD-10-CM | POA: Diagnosis not present

## 2017-10-23 DIAGNOSIS — K5909 Other constipation: Secondary | ICD-10-CM | POA: Diagnosis present

## 2017-10-23 DIAGNOSIS — Z7982 Long term (current) use of aspirin: Secondary | ICD-10-CM

## 2017-10-23 DIAGNOSIS — Z87442 Personal history of urinary calculi: Secondary | ICD-10-CM

## 2017-10-23 DIAGNOSIS — M1990 Unspecified osteoarthritis, unspecified site: Secondary | ICD-10-CM | POA: Diagnosis present

## 2017-10-23 DIAGNOSIS — E1122 Type 2 diabetes mellitus with diabetic chronic kidney disease: Secondary | ICD-10-CM | POA: Diagnosis present

## 2017-10-23 DIAGNOSIS — G2581 Restless legs syndrome: Secondary | ICD-10-CM | POA: Diagnosis present

## 2017-10-23 DIAGNOSIS — E785 Hyperlipidemia, unspecified: Secondary | ICD-10-CM | POA: Diagnosis present

## 2017-10-23 DIAGNOSIS — N189 Chronic kidney disease, unspecified: Secondary | ICD-10-CM | POA: Diagnosis not present

## 2017-10-23 DIAGNOSIS — C7800 Secondary malignant neoplasm of unspecified lung: Secondary | ICD-10-CM

## 2017-10-23 DIAGNOSIS — E871 Hypo-osmolality and hyponatremia: Secondary | ICD-10-CM | POA: Diagnosis present

## 2017-10-23 DIAGNOSIS — R339 Retention of urine, unspecified: Secondary | ICD-10-CM | POA: Diagnosis not present

## 2017-10-23 DIAGNOSIS — Z7984 Long term (current) use of oral hypoglycemic drugs: Secondary | ICD-10-CM

## 2017-10-23 DIAGNOSIS — Z803 Family history of malignant neoplasm of breast: Secondary | ICD-10-CM | POA: Diagnosis not present

## 2017-10-23 DIAGNOSIS — F329 Major depressive disorder, single episode, unspecified: Secondary | ICD-10-CM | POA: Diagnosis present

## 2017-10-23 DIAGNOSIS — N179 Acute kidney failure, unspecified: Secondary | ICD-10-CM

## 2017-10-23 DIAGNOSIS — Z8249 Family history of ischemic heart disease and other diseases of the circulatory system: Secondary | ICD-10-CM | POA: Diagnosis not present

## 2017-10-23 DIAGNOSIS — C787 Secondary malignant neoplasm of liver and intrahepatic bile duct: Secondary | ICD-10-CM

## 2017-10-23 DIAGNOSIS — C7951 Secondary malignant neoplasm of bone: Secondary | ICD-10-CM | POA: Diagnosis present

## 2017-10-23 DIAGNOSIS — Z8551 Personal history of malignant neoplasm of bladder: Secondary | ICD-10-CM | POA: Diagnosis not present

## 2017-10-23 DIAGNOSIS — Z96652 Presence of left artificial knee joint: Secondary | ICD-10-CM | POA: Diagnosis present

## 2017-10-23 DIAGNOSIS — I129 Hypertensive chronic kidney disease with stage 1 through stage 4 chronic kidney disease, or unspecified chronic kidney disease: Secondary | ICD-10-CM | POA: Diagnosis present

## 2017-10-23 DIAGNOSIS — G4733 Obstructive sleep apnea (adult) (pediatric): Secondary | ICD-10-CM | POA: Diagnosis present

## 2017-10-23 DIAGNOSIS — Z79899 Other long term (current) drug therapy: Secondary | ICD-10-CM

## 2017-10-23 DIAGNOSIS — Z938 Other artificial opening status: Secondary | ICD-10-CM | POA: Diagnosis not present

## 2017-10-23 DIAGNOSIS — K219 Gastro-esophageal reflux disease without esophagitis: Secondary | ICD-10-CM | POA: Diagnosis present

## 2017-10-23 DIAGNOSIS — E872 Acidosis: Secondary | ICD-10-CM | POA: Diagnosis present

## 2017-10-23 DIAGNOSIS — F419 Anxiety disorder, unspecified: Secondary | ICD-10-CM | POA: Diagnosis present

## 2017-10-23 DIAGNOSIS — E875 Hyperkalemia: Secondary | ICD-10-CM | POA: Diagnosis present

## 2017-10-23 DIAGNOSIS — C439 Malignant melanoma of skin, unspecified: Secondary | ICD-10-CM

## 2017-10-23 DIAGNOSIS — N139 Obstructive and reflux uropathy, unspecified: Principal | ICD-10-CM | POA: Diagnosis present

## 2017-10-23 DIAGNOSIS — R531 Weakness: Secondary | ICD-10-CM

## 2017-10-23 DIAGNOSIS — E860 Dehydration: Secondary | ICD-10-CM

## 2017-10-23 DIAGNOSIS — G8929 Other chronic pain: Secondary | ICD-10-CM | POA: Diagnosis present

## 2017-10-23 HISTORY — DX: Secondary malignant neoplasm of bone: C79.51

## 2017-10-23 LAB — URINALYSIS, COMPLETE (UACMP) WITH MICROSCOPIC
BILIRUBIN URINE: NEGATIVE
Bacteria, UA: NONE SEEN
GLUCOSE, UA: NEGATIVE mg/dL
Ketones, ur: NEGATIVE mg/dL
LEUKOCYTES UA: NEGATIVE
NITRITE: NEGATIVE
PH: 5 (ref 5.0–8.0)
Protein, ur: NEGATIVE mg/dL
SPECIFIC GRAVITY, URINE: 1.014 (ref 1.005–1.030)
SQUAMOUS EPITHELIAL / LPF: NONE SEEN
WBC, UA: NONE SEEN WBC/hpf (ref 0–5)

## 2017-10-23 LAB — BASIC METABOLIC PANEL
ANION GAP: 11 (ref 5–15)
BUN: 77 mg/dL — AB (ref 6–20)
CHLORIDE: 99 mmol/L — AB (ref 101–111)
CO2: 19 mmol/L — ABNORMAL LOW (ref 22–32)
Calcium: 7.8 mg/dL — ABNORMAL LOW (ref 8.9–10.3)
Creatinine, Ser: 4.79 mg/dL — ABNORMAL HIGH (ref 0.61–1.24)
GFR calc Af Amer: 13 mL/min — ABNORMAL LOW (ref >60)
GFR, EST NON AFRICAN AMERICAN: 11 mL/min — AB (ref >60)
GLUCOSE: 211 mg/dL — AB (ref 65–99)
POTASSIUM: 5.4 mmol/L — AB (ref 3.5–5.1)
Sodium: 129 mmol/L — ABNORMAL LOW (ref 135–145)

## 2017-10-23 LAB — CBC
HEMATOCRIT: 27.1 % — AB (ref 40.0–52.0)
HEMOGLOBIN: 8.7 g/dL — AB (ref 13.0–18.0)
MCH: 27.3 pg (ref 26.0–34.0)
MCHC: 32.2 g/dL (ref 32.0–36.0)
MCV: 84.7 fL (ref 80.0–100.0)
Platelets: 299 10*3/uL (ref 150–440)
RBC: 3.2 MIL/uL — ABNORMAL LOW (ref 4.40–5.90)
RDW: 14.3 % (ref 11.5–14.5)
WBC: 10.6 10*3/uL (ref 3.8–10.6)

## 2017-10-23 LAB — GLUCOSE, CAPILLARY
GLUCOSE-CAPILLARY: 141 mg/dL — AB (ref 65–99)
GLUCOSE-CAPILLARY: 205 mg/dL — AB (ref 65–99)

## 2017-10-23 MED ORDER — FLUOXETINE HCL 10 MG PO CAPS
10.0000 mg | ORAL_CAPSULE | Freq: Every day | ORAL | Status: DC
  Administered 2017-10-23 – 2017-10-26 (×4): 10 mg via ORAL
  Filled 2017-10-23 (×4): qty 1

## 2017-10-23 MED ORDER — ACETAMINOPHEN ER 650 MG PO TBCR: 650.0000 mg | EXTENDED_RELEASE_TABLET | Freq: Two times a day (BID) | ORAL | Status: DC | PRN

## 2017-10-23 MED ORDER — ENOXAPARIN SODIUM 30 MG/0.3ML ~~LOC~~ SOLN
30.0000 mg | Freq: Two times a day (BID) | SUBCUTANEOUS | Status: DC
  Administered 2017-10-23: 22:00:00 30 mg via SUBCUTANEOUS
  Filled 2017-10-23: qty 0.3

## 2017-10-23 MED ORDER — ALPRAZOLAM 0.5 MG PO TABS: 0.5000 mg | ORAL_TABLET | Freq: Every evening | ORAL | Status: DC | PRN

## 2017-10-23 MED ORDER — INSULIN ASPART 100 UNIT/ML ~~LOC~~ SOLN
0.0000 [IU] | Freq: Every day | SUBCUTANEOUS | Status: DC
  Administered 2017-10-23: 2 [IU] via SUBCUTANEOUS
  Administered 2017-10-24: 22:00:00 4 [IU] via SUBCUTANEOUS
  Administered 2017-10-25: 23:00:00 3 [IU] via SUBCUTANEOUS
  Filled 2017-10-23 (×3): qty 1

## 2017-10-23 MED ORDER — PANTOPRAZOLE SODIUM 40 MG PO TBEC
40.0000 mg | DELAYED_RELEASE_TABLET | Freq: Every day | ORAL | Status: DC
  Administered 2017-10-23 – 2017-10-26 (×4): 40 mg via ORAL
  Filled 2017-10-23 (×4): qty 1

## 2017-10-23 MED ORDER — GLIPIZIDE 10 MG PO TABS
10.0000 mg | ORAL_TABLET | Freq: Every day | ORAL | Status: DC
  Administered 2017-10-24 – 2017-10-26 (×3): 10 mg via ORAL
  Filled 2017-10-23 (×4): qty 1

## 2017-10-23 MED ORDER — FERROUS SULFATE 325 (65 FE) MG PO TABS
325.0000 mg | ORAL_TABLET | Freq: Every day | ORAL | Status: DC
  Administered 2017-10-24 – 2017-10-26 (×3): 325 mg via ORAL
  Filled 2017-10-23 (×3): qty 1

## 2017-10-23 MED ORDER — TAMSULOSIN HCL 0.4 MG PO CAPS
0.4000 mg | ORAL_CAPSULE | Freq: Every day | ORAL | Status: DC
  Administered 2017-10-23 – 2017-10-25 (×3): 0.4 mg via ORAL
  Filled 2017-10-23 (×3): qty 1

## 2017-10-23 MED ORDER — ONDANSETRON HCL 4 MG/2ML IJ SOLN: 4.0000 mg | Freq: Four times a day (QID) | INTRAMUSCULAR | Status: DC | PRN

## 2017-10-23 MED ORDER — ASPIRIN EC 81 MG PO TBEC
81.0000 mg | DELAYED_RELEASE_TABLET | Freq: Every evening | ORAL | Status: DC
  Administered 2017-10-23 – 2017-10-25 (×3): 81 mg via ORAL
  Filled 2017-10-23 (×3): qty 1

## 2017-10-23 MED ORDER — GABAPENTIN 300 MG PO CAPS
300.0000 mg | ORAL_CAPSULE | Freq: Every evening | ORAL | Status: DC
  Administered 2017-10-23 – 2017-10-25 (×3): 300 mg via ORAL
  Filled 2017-10-23 (×3): qty 1

## 2017-10-23 MED ORDER — OXYCODONE HCL 5 MG PO TABS: 10.0000 mg | ORAL_TABLET | Freq: Two times a day (BID) | ORAL | Status: DC | PRN

## 2017-10-23 MED ORDER — ONDANSETRON HCL 4 MG PO TABS: 4.0000 mg | ORAL_TABLET | Freq: Four times a day (QID) | ORAL | Status: DC | PRN

## 2017-10-23 MED ORDER — DOCUSATE SODIUM 100 MG PO CAPS
100.0000 mg | ORAL_CAPSULE | Freq: Two times a day (BID) | ORAL | Status: DC
  Administered 2017-10-23 – 2017-10-24 (×2): 100 mg via ORAL
  Filled 2017-10-23 (×2): qty 1

## 2017-10-23 MED ORDER — DEXAMETHASONE SODIUM PHOSPHATE 10 MG/ML IJ SOLN
10.0000 mg | INTRAMUSCULAR | Status: AC
  Administered 2017-10-23: 10 mg via INTRAVENOUS
  Filled 2017-10-23: qty 1

## 2017-10-23 MED ORDER — SODIUM POLYSTYRENE SULFONATE PO POWD
15.0000 g | Freq: Once | ORAL | Status: DC
  Filled 2017-10-23: qty 15

## 2017-10-23 MED ORDER — CARVEDILOL 25 MG PO TABS
25.0000 mg | ORAL_TABLET | Freq: Two times a day (BID) | ORAL | Status: DC
  Administered 2017-10-24 – 2017-10-26 (×4): 25 mg via ORAL
  Filled 2017-10-23 (×6): qty 1

## 2017-10-23 MED ORDER — SODIUM POLYSTYRENE SULFONATE 15 GM/60ML PO SUSP
15.0000 g | Freq: Once | ORAL | Status: DC
  Administered 2017-10-23: 16:00:00 15 g via ORAL
  Filled 2017-10-23: qty 60

## 2017-10-23 MED ORDER — OMEGA-3-ACID ETHYL ESTERS 1 G PO CAPS
1.0000 g | ORAL_CAPSULE | Freq: Every day | ORAL | Status: DC
  Administered 2017-10-23 – 2017-10-26 (×4): 1 g via ORAL
  Filled 2017-10-23 (×4): qty 1

## 2017-10-23 MED ORDER — FERROUS SULFATE DRIED ER 160 (50 FE) MG PO TBCR: 160.0000 mg | EXTENDED_RELEASE_TABLET | Freq: Two times a day (BID) | ORAL | Status: DC

## 2017-10-23 MED ORDER — PATIROMER SORBITEX CALCIUM 8.4 G PO PACK
8.4000 g | PACK | Freq: Two times a day (BID) | ORAL | Status: DC
  Administered 2017-10-23 – 2017-10-25 (×5): 8.4 g via ORAL
  Filled 2017-10-23 (×7): qty 4

## 2017-10-23 MED ORDER — MEGESTROL ACETATE 20 MG PO TABS
40.0000 mg | ORAL_TABLET | Freq: Every day | ORAL | Status: DC
  Administered 2017-10-23 – 2017-10-26 (×4): 40 mg via ORAL
  Filled 2017-10-23 (×4): qty 2

## 2017-10-23 MED ORDER — ATORVASTATIN CALCIUM 20 MG PO TABS
40.0000 mg | ORAL_TABLET | Freq: Every day | ORAL | Status: DC
  Administered 2017-10-23 – 2017-10-25 (×3): 40 mg via ORAL
  Filled 2017-10-23 (×3): qty 2

## 2017-10-23 MED ORDER — SODIUM CHLORIDE 0.9 % IV SOLN
INTRAVENOUS | Status: AC
  Administered 2017-10-23 – 2017-10-24 (×2): via INTRAVENOUS

## 2017-10-23 MED ORDER — SODIUM CHLORIDE 0.9 % IV BOLUS
500.0000 mL | Freq: Once | INTRAVENOUS | Status: AC
  Administered 2017-10-23: 500 mL via INTRAVENOUS

## 2017-10-23 MED ORDER — PNEUMOCOCCAL VAC POLYVALENT 25 MCG/0.5ML IJ INJ
0.5000 mL | INJECTION | INTRAMUSCULAR | Status: AC
  Administered 2017-10-24: 10:00:00 0.5 mL via INTRAMUSCULAR
  Filled 2017-10-23: qty 0.5

## 2017-10-23 MED ORDER — INSULIN ASPART 100 UNIT/ML ~~LOC~~ SOLN
0.0000 [IU] | Freq: Three times a day (TID) | SUBCUTANEOUS | Status: DC
  Administered 2017-10-23: 1 [IU] via SUBCUTANEOUS
  Administered 2017-10-24: 7 [IU] via SUBCUTANEOUS
  Administered 2017-10-24: 08:00:00 3 [IU] via SUBCUTANEOUS
  Administered 2017-10-24 – 2017-10-25 (×2): 7 [IU] via SUBCUTANEOUS
  Administered 2017-10-25: 5 [IU] via SUBCUTANEOUS
  Administered 2017-10-25 – 2017-10-26 (×2): 2 [IU] via SUBCUTANEOUS
  Filled 2017-10-23 (×8): qty 1

## 2017-10-23 MED ORDER — ACETAMINOPHEN 325 MG PO TABS: 325.0000 mg | ORAL_TABLET | Freq: Four times a day (QID) | ORAL | Status: DC | PRN

## 2017-10-23 NOTE — Progress Notes (Signed)
[facility]  ROUNDING NOTE   Subjective:   Mr. Tyler Henderson admitted to [facility] on 10/23/2017 for Dehydration [E86.0] Urinary retention [R33.9] AKI (acute kidney injury) ([place]) [N17.9] Acute kidney injury ([place]) [N17.9]  Foley catheter placed and 1 liter of urine produced.   Objective:  Vital signs in last 24 hours:  Temp:  [98.2 F (36.8 C)-98.7 F (37.1 C)] 98.7 F (37.1 C) (04/01 1337) Pulse Rate:  [92-96] 96 (04/01 1337) Resp:  [16-20] 20 (04/01 1337) BP: (99-111)/(51-68) 105/55 (04/01 1337) SpO2:  [90 %-97 %] 95 % (04/01 1337) Weight:  [147.9 kg (326 lb)] 147.9 kg (326 lb) (04/01 0954)  Weight change:  Filed Weights   10/23/17 0954  Weight: (!) 147.9 kg (326 lb)    Intake/Output: I/O last 3 completed shifts: In: 753.8 [P.O.:240; I.V.:13.8; IV Piggyback:500] Out: 1000 [Urine:1000]   Intake/Output this shift:  No intake/output data recorded.  Physical Exam: General: NAD, laying in bed  Head: Normocephalic, atraumatic. Moist oral mucosal membranes  Eyes: Anicteric, PERRL  Neck: Supple, trachea midline  Lungs:  Clear to auscultation  Heart: Regular rate and rhythm  Abdomen:  Soft, nontender,   Extremities: 1+ peripheral edema.  Neurologic: Nonfocal, moving all four extremities  Skin: No lesions  GU: Foley with clear urine    Basic Metabolic Panel: Recent Labs  Lab 10/20/17 0909 10/23/17 1000  NA 136 129*  K 4.4 5.4*  CL 103 99*  CO2 22 19*  GLUCOSE 234* 211*  BUN 49* 77*  CREATININE 2.78* 4.79*  CALCIUM 8.5* 7.8*    Liver Function Tests: Recent Labs  Lab 10/20/17 0909  AST 29  ALT 20  ALKPHOS 251*  BILITOT 0.4  PROT 6.6  ALBUMIN 2.6*   No results for input(s): LIPASE, AMYLASE in the last 168 hours. No results for input(s): AMMONIA in the last 168 hours.  CBC: Recent Labs  Lab 10/20/17 0909 10/23/17 1000  WBC 7.9 10.6  NEUTROABS 6.4  --   HGB 8.6* 8.7*  HCT 26.5* 27.1*  MCV 84.7 84.7  PLT 371 299    Cardiac  Enzymes: No results for input(s): CKTOTAL, CKMB, CKMBINDEX, TROPONINI in the last 168 hours.  BNP: Invalid input(s): POCBNP  CBG: Recent Labs  Lab 10/19/17 0900 10/23/17 1636  GLUCAP 203* 141*    Microbiology: Results for orders placed or performed in visit on 06/15/16  Microscopic Examination     Status: Abnormal   Collection Time: 06/15/16  9:12 AM  Result Value Ref Range Status   WBC, UA 0-5 0 - 5 /hpf Final   RBC, UA 3-10 (A) 0 - 2 /hpf Final   Epithelial Cells (non renal) 0-10 0 - 10 /hpf Final   Bacteria, UA None seen None seen/Few Final    Coagulation Studies: No results for input(s): LABPROT, INR in the last 72 hours.  Urinalysis: Recent Labs    10/23/17 1000  COLORURINE AMBER*  LABSPEC 1.014  PHURINE 5.0  GLUCOSEU NEGATIVE  HGBUR SMALL*  BILIRUBINUR NEGATIVE  KETONESUR NEGATIVE  PROTEINUR NEGATIVE  NITRITE NEGATIVE  LEUKOCYTESUR NEGATIVE      Imaging: Dg Abdomen 1 View  Result Date: 10/23/2017 CLINICAL DATA:  Bladder carcinoma.  Pain and inability to urinate EXAM: ABDOMEN - 1 VIEW COMPARISON:  PET-CT September 21, 2017 FINDINGS: There is moderate stool throughout the colon. There is no bowel dilatation or air-fluid level to suggest bowel obstruction. No free air. There are probable phleboliths in the pelvis. Urinary bladder does not appear  distended by radiography. IMPRESSION: No bowel obstruction or free air. Urinary bladder does not appear distended by radiography. Electronically Signed   By: Lowella Grip III M.D.   On: 10/23/2017 10:51   US Renal  Result Date: 10/23/2017 CLINICAL DATA:  Acute renal injury, history of metastatic bladder carcinoma EXAM: RENAL / URINARY TRACT ULTRASOUND COMPLETE COMPARISON:  07/14/2017. FINDINGS: Right Kidney: Length: 11.7 cm. Echogenicity within normal limits. No mass or hydronephrosis visualized. Left Kidney: Length: 12.1 cm. Cysts are noted within the left kidney. The largest of these measures 9.1 cm in greatest  dimension. These are stable when compared with prior CT examination. No hydronephrosis is noted. Bladder: Appears normal for degree of bladder distention. Note is made of known lesions within the right lobe of the liver. IMPRESSION: Left renal cystic change. Stable masses in the liver are noted. Electronically Signed   By: Inez Catalina M.D.   On: 10/23/2017 15:24     Medications:   . sodium chloride 75 mL/hr at 10/23/17 1549   . aspirin EC  81 mg Oral QPM  . atorvastatin  40 mg Oral q1800  . carvedilol  25 mg Oral BID WC  . docusate sodium  100 mg Oral BID  . enoxaparin (LOVENOX) injection  30 mg Subcutaneous Q12H  . [START ON 10/24/2017] ferrous sulfate  325 mg Oral Q breakfast  . FLUoxetine  10 mg Oral Daily  . gabapentin  300 mg Oral QPM  . glipiZIDE  10 mg Oral QAC breakfast  . insulin aspart  0-5 Units Subcutaneous QHS  . insulin aspart  0-9 Units Subcutaneous TID WC  . megestrol  40 mg Oral Daily  . omega-3 acid ethyl esters  1 g Oral Daily  . pantoprazole  40 mg Oral Daily  . patiromer  8.4 g Oral BID  . [START ON 10/24/2017] pneumococcal 23 valent vaccine  0.5 mL Intramuscular Tomorrow-1000  . tamsulosin  0.4 mg Oral QPC supper   acetaminophen, ALPRAZolam, ondansetron **OR** ondansetron (ZOFRAN) IV, oxyCODONE  Assessment/ Plan:  Mr. Tyler Henderson is a 72 y.o. white male with diabetes mellitus type 2, GERD, osteoarthritis, hypertension, hyperlipidemia, obstructive sleep apnea  1. Acute renal failure with hyponatremia, metabolic acidosis and hyperkalemia on chronic kidney disease stage IV with proteinuria. Baseline creatinine of 2.1, GFR of 30 on 10/16/17 Acute renal failure secondary to obstructive uropathy Chronic kidney disease secondary to metastatic melanoma, diabetes and hypertension - Appreciate urology input - Continue NS  - No acute indication for dialysis.    LOS: 0 [name] 4/1/20197:26 PM

## 2017-10-23 NOTE — Progress Notes (Signed)
Anticoagulation monitoring(Lovenox):  72 yo male ordered Lovenox 30 mg Q24h  Filed Weights   10/23/17 0954  Weight: (!) 326 lb (147.9 kg)   BMI 43.15   Lab Results  Component Value Date   CREATININE 4.79 (H) 10/23/2017   CREATININE 2.78 (H) 10/20/2017   CREATININE 2.27 (H) 09/15/2017   Estimated Creatinine Clearance: 20.8 mL/min (A) (by C-G formula based on SCr of 4.79 mg/dL (H)). Hemoglobin & Hematocrit     Component Value Date/Time   HGB 8.7 (L) 10/23/2017 1000   HCT 27.1 (L) 10/23/2017 1000     Per Protocol for Patient with estCrcl < 30 ml/min and BMI > 40, will transition to Lovenox 30 mg Q12h.

## 2017-10-23 NOTE — ED Triage Notes (Addendum)
Pt presents to ED via [facility] from home c/o being unable to urinate or defecate since last Thursday. Pt has hx melanoma, had first treatment at cancer center last Thursday as well. Pt denies pain at this time.

## 2017-10-23 NOTE — Progress Notes (Signed)
Family Meeting Note  Advance Directive:yes  Today a meeting took place with the Patient.    The following clinical team members were present during this meeting:MD  The following were discussed:Patient's diagnosis: Acute kidney injury on chronic kidney disease, acute urinary retention, metastatic melanoma stage IV on immunotherapy, supposed to get radiation treatment today which was canceled, hyperkalemia, hyponatremia, diabetes mellitus; the diagnosis and plan of care was discussed in detail with the patient.  He verbalized understanding of the plan.  Patient's progosis: Unable to determine and Goals for treatment: Full Code, wife hcpoa  Additional follow-up to be provided: Hospitalist, urology, nephrology, oncology and if necessary neurosurgery  Time spent during discussion:18 min  Nicholes Mango, MD

## 2017-10-23 NOTE — H&P (Signed)
[facility] NAME: Tyler Henderson    MR#:  [id number]  DATE OF BIRTH:  1945-11-03  DATE OF ADMISSION:  10/23/2017  PRIMARY CARE PHYSICIAN: Sofie Hartigan, MD   REQUESTING/REFERRING PHYSICIAN: [email or web address]  CHIEF COMPLAINT:   Urinary retention HISTORY OF PRESENT ILLNESS:  Tyler Henderson  is a 72 y.o. male with a known history of metastatic melanoma, edematous metas, chronic kidney disease, bladder cancer, GERD, edematous medicine multiple other medical problems is presenting to the ED with a chief complaint of decreased urination for the past 2 to 3 days.  Patient denies any nausea vomiting or abdominal discomfort.  After placing Foley catheter patient had 900 to 1000 cc urine output.  Foley catheter was continued.  Patient had immunotherapy last Friday.  Supposed to get radiation therapy today which was canceled as the patient is in the emergency department.  Patient was seen by [facility] urology in the past and Dr. Zollie Scale regarding his chronic kidney disease  PAST MEDICAL HISTORY:   Past Medical History:  Diagnosis Date  . Anxiety   . Arthritis   . Bladder cancer ([place])   . Chronic kidney disease   . Depression   . Diabetes ([place])   . Dysrhythmia    atrial fib  . GERD (gastroesophageal reflux disease)   . Hypertension   . Restless leg syndrome   . Shortness of breath dyspnea    with exertion  . Skin cancer   . Sleep apnea    CPAP "once in awhile"    PAST SURGICAL HISTOIRY:   Past Surgical History:  Procedure Laterality Date  . APPENDECTOMY    . JOINT REPLACEMENT Left    Total Knee Replacement  . KNEE ARTHROSCOPY Left   . PORTA CATH INSERTION N/A 10/19/2017   Procedure: PORTA CATH INSERTION;  Surgeon: Algernon Huxley, MD;  Location: [place] [facility];  Service: Cardiovascular;  Laterality: N/A;  . REPLACEMENT TOTAL KNEE Left 2012   Dr. Little [facility], Jr. [facility]  . TRANSURETHRAL RESECTION OF BLADDER TUMOR N/A 03/14/2016    Procedure: TRANSURETHRAL RESECTION OF BLADDER TUMOR (TURBT);  Surgeon: Hollice Espy, MD;  Location: [facility] ORS;  Service: Urology;  Laterality: N/A;    SOCIAL HISTORY:   Social History   Tobacco Use  . Smoking status: Former Smoker    Packs/day: 1.00    Years: 20.00    Pack years: 20.00    Types: Cigarettes    Last attempt to quit: 1979    Years since quitting: 40.2  . Smokeless tobacco: Never Used  Substance Use Topics  . Alcohol use: Yes    Alcohol/week: 1.2 oz    Types: 2 Cans of beer per week    Comment: occassional    FAMILY HISTORY:   Family History  Problem Relation Age of Onset  . Cancer Mother        Breast  . Heart failure Father   . Prostate cancer Neg Hx   . Bladder Cancer Neg Hx     DRUG ALLERGIES:  No Known Allergies  REVIEW OF SYSTEMS:  CONSTITUTIONAL: No fever, fatigue or weakness.  EYES: No blurred or double vision.  EARS, NOSE, AND THROAT: No tinnitus or ear pain.  RESPIRATORY: No cough, shortness of breath, wheezing or hemoptysis.  CARDIOVASCULAR: No chest pain, orthopnea, edema.  GASTROINTESTINAL: No nausea, vomiting, diarrhea or abdominal pain.  GENITOURINARY: No dysuria, hematuria.  ENDOCRINE: No polyuria, nocturia,  HEMATOLOGY: No anemia, easy  bruising or bleeding SKIN: No rash or lesion. MUSCULOSKELETAL: No joint pain or arthritis.   NEUROLOGIC: No tingling, numbness, weakness.  PSYCHIATRY: No anxiety or depression.   MEDICATIONS AT HOME:   Prior to Admission medications   Medication Sig Start Date End Date Taking? Authorizing Provider  acetaminophen (TYLENOL) 650 MG CR tablet Take 650 mg by mouth every 12 (twelve) hours as needed for pain.   Yes [provider]  ALPRAZolam Duanne Moron) 0.5 MG tablet Take 1 tablet (0.5 mg total) by mouth at bedtime as needed for anxiety. Patient taking differently: Take 0.5 mg by mouth daily as needed for anxiety.  10/02/17  Yes Lloyd Huger, MD  aspirin EC 81 MG tablet Take 81 mg by  mouth every evening.    Yes [provider]  atorvastatin (LIPITOR) 40 MG tablet Take 40 mg by mouth in the evening 03/23/15  Yes [provider]  carvedilol (COREG) 25 MG tablet Take 25 mg by mouth twice daily 06/26/15  Yes [provider]  ferrous sulfate (SLOW RELEASE IRON) 160 (50 Fe) MG TBCR SR tablet Take 160 mg by mouth 2 (two) times daily.    Yes [provider]  FLUoxetine (PROZAC) 10 MG capsule Take 10 mg by mouth daily.  10/14/17  Yes [provider]  gabapentin (NEURONTIN) 300 MG capsule Take 300 mg by mouth every evening.    Yes [provider]  glipiZIDE (GLUCOTROL) 10 MG tablet Take 10 mg by mouth in the evening 03/23/15  Yes [provider]  lisinopril (PRINIVIL,ZESTRIL) 40 MG tablet Take 40 mg by mouth in the evening 06/25/15  Yes [provider]  megestrol (MEGACE) 40 MG tablet Take 1 tablet (40 mg total) by mouth daily. 10/13/17  Yes Lloyd Huger, MD  Multiple Vitamin (MULTI-VITAMINS) TABS Take 1 tablet by mouth daily. Alive Mens Multivitamin   Yes [provider]  Omega-3 Fatty Acids (FISH OIL PO) Take 1 capsule by mouth 2 (two) times daily.    Yes [provider]  omeprazole (PRILOSEC) 20 MG capsule Take 20 mg by mouth in the evening 03/23/15  Yes [provider]  patiromer (VELTASSA) 8.4 g packet Take 8.4 g by mouth 2 (two) times daily.   Yes [provider]  pioglitazone (ACTOS) 45 MG tablet Take 45 mg by mouth in the morning 03/23/15  Yes [provider]  docusate sodium (COLACE) 100 MG capsule Take 1 capsule (100 mg total) by mouth 2 (two) times daily. Patient taking differently: Take 100-200 mg by mouth daily as needed for moderate constipation.  03/14/16   Hollice Espy, MD  lidocaine-prilocaine (EMLA) cream Apply to affected area once 10/15/17   Lloyd Huger, MD  Oxycodone HCl 10 MG TABS Take 1 tablet (10 mg total) by mouth 2 (two) times daily as  needed. Patient taking differently: Take 10 mg by mouth 2 (two) times daily as needed (for pain).  10/13/17   Lloyd Huger, MD      VITAL SIGNS:  Blood pressure (!) 105/55, pulse 96, temperature 98.7 F (37.1 C), temperature source Oral, resp. rate 20, height 6\' 1"  (1.854 m), weight (!) 147.9 kg (326 lb), SpO2 95 %.  PHYSICAL EXAMINATION:  GENERAL:  72 y.o.-year-old patient lying in the bed with no acute distress.  EYES: Pupils equal, round, reactive to light and accommodation. No scleral icterus. Extraocular muscles intact.  HEENT: Head atraumatic, normocephalic. Oropharynx and nasopharynx clear.  NECK:  Supple, no jugular venous distention.  No thyroid enlargement, no tenderness.  LUNGS: Normal breath sounds bilaterally, no wheezing, rales,rhonchi or crepitation. No use of accessory muscles of respiration.  CARDIOVASCULAR: S1, S2 normal. No murmurs, rubs, or gallops.  ABDOMEN: Soft, nontender, nondistended. Bowel sounds present. No organomegaly or mass.  EXTREMITIES: No pedal edema, cyanosis, or clubbing.  NEUROLOGIC: Cranial nerves II through XII are intact. Muscle strength gen weakness in all extremities. Sensation intact. Gait not checked.  PSYCHIATRIC: The patient is alert and oriented x 3.  SKIN: No obvious rash, lesion, or ulcer.   LABORATORY PANEL:   CBC Recent Labs  Lab 10/23/17 1000  WBC 10.6  HGB 8.7*  HCT 27.1*  PLT 299   ------------------------------------------------------------------------------------------------------------------  Chemistries  Recent Labs  Lab 10/20/17 0909 10/23/17 1000  NA 136 129*  K 4.4 5.4*  CL 103 99*  CO2 22 19*  GLUCOSE 234* 211*  BUN 49* 77*  CREATININE 2.78* 4.79*  CALCIUM 8.5* 7.8*  AST 29  --   ALT 20  --   ALKPHOS 251*  --   BILITOT 0.4  --    ------------------------------------------------------------------------------------------------------------------  Cardiac Enzymes No results for input(s): TROPONINI  in the last 168 hours. ------------------------------------------------------------------------------------------------------------------  RADIOLOGY:  Dg Abdomen 1 View  Result Date: 10/23/2017 CLINICAL DATA:  Bladder carcinoma.  Pain and inability to urinate EXAM: ABDOMEN - 1 VIEW COMPARISON:  PET-CT September 21, 2017 FINDINGS: There is moderate stool throughout the colon. There is no bowel dilatation or air-fluid level to suggest bowel obstruction. No free air. There are probable phleboliths in the pelvis. Urinary bladder does not appear distended by radiography. IMPRESSION: No bowel obstruction or free air. Urinary bladder does not appear distended by radiography. Electronically Signed   By: Lowella Grip III M.D.   On: 10/23/2017 10:51   US Renal  Result Date: 10/23/2017 CLINICAL DATA:  Acute renal injury, history of metastatic bladder carcinoma EXAM: RENAL / URINARY TRACT ULTRASOUND COMPLETE COMPARISON:  07/14/2017. FINDINGS: Right Kidney: Length: 11.7 cm. Echogenicity within normal limits. No mass or hydronephrosis visualized. Left Kidney: Length: 12.1 cm. Cysts are noted within the left kidney. The largest of these measures 9.1 cm in greatest dimension. These are stable when compared with prior CT examination. No hydronephrosis is noted. Bladder: Appears normal for degree of bladder distention. Note is made of known lesions within the right lobe of the liver. IMPRESSION: Left renal cystic change. Stable masses in the liver are noted. Electronically Signed   By: Inez Catalina M.D.   On: 10/23/2017 15:24    EKG:   Orders placed or performed in visit on 09/18/17  . EKG 12-Lead  . EKG 12-Lead  . EKG 12-Lead    IMPRESSION AND PLAN:     #Acute kidney injury on chronic kidney disease prerenalas well as post renal  as patient is coming with acute urinary retention  Admit to MedSurg unit IV fluids Continue Foley catheter to monitor urine output Nephrology, urology consulted Renal  ultrasound with no acute findings Baseline creatinine 2.6 and currently he is at 4.79  #Hyperkalemia and hyponatremia Patient was on [place] which was held by him for the past few days as he was not eating and drinking,Resume [place] Gentle hydration with IV fluids and recheck a.m. Labs  #Acute urinary retention with history of metastatic bladder lesion Given the patient history of metastatic melanoma we will get MRI of the lumbar spine stat to rule out acute cord compression, will consult neurosurgery if needed We will go ahead and give  Decadron 10 mg IV x1 in the interim  #Metastatic melanoma stage IV Oncology consulted patient was seen by Dr. Grayland Ormond in the past and had immunotherapy on last Friday.  Patient is scheduled to get radiation therapy today which is canceled as the patient came into the ED for urinary retention  #Diabetes mental sliding scale insulin   All the records are reviewed and case discussed with ED provider. Management plans discussed with the patient, family and they are in agreement.  CODE STATUS: fc   TOTAL TIME TAKING CARE OF THIS PATIENT: 43  minutes.   Note: This dictation was prepared with Dragon dictation along with smaller phrase technology. Any transcriptional errors that result from this process are unintentional.  Nicholes Mango M.D on 10/23/2017 at 5:05 PM  Between 7am to 6pm - Pager - [phone]  After 6pm go to [email or web address] - password [email or web address] [facility]  [facility]  Office  [phone]  CC: Primary care physician; Sofie Hartigan, MD

## 2017-10-23 NOTE — Telephone Encounter (Signed)
Wife called to report that the patient is extremely weak and that she is going to have to call EMS to transport to the hospital. She also reports that he is unable to urinate. I advised that she go ahead and call EMS to take him to the ER for evaluation

## 2017-10-23 NOTE — Progress Notes (Signed)
Urology Consult  I have been asked to see the patient by Dr. Carl Best, for evaluation and management of urinary retention.  Chief Complaint: Inability to urinate  History of Present Illness: Tyler Henderson is a 72 y.o. year old with stage IV metastatic melanoma to the lungs/ liver/ bones including to the L4 vertebral body (lytic lesion) who presented to the emergency room with inability to urinate.  This is been going on since Friday.  Initially, he was having weak stream and then ultimately the inability to urinate whatsoever.  A Foley catheter was placed in the emergency room.  This yielded greater than a liter.  He has acute on chronic kidney injury, creatinine elevated to 4.79 up from 2.78 on 10/20/2017.  His previous baseline appears to be around 1.8.  Renal ultrasound shows no evidence of hydronephrosis bilaterally.  He does have a personal history of right lateral bladder wall s/p TURBT on 02/2016.  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  He never returned for surveillance cystoscopy at 84-month interval as recommended.  His overall performance status is poor.  He has severe back pain, chronic weakness and fatigue.  He notes specifically today that around the time of his PET scan on 09/21/2017.  He began to experience lower extremity weakness, numbness of his right anterior thigh.    He does also note chronic constipation.  This is worsened over the past week.  He reports that he has not had a normal bowel movement in over a week but irregular small stools.  He is currently under the care of Dr. Grayland Ormond and receiving palliative combination immunotherapy using nivolumab and ipilimumab.    Past Medical History:  Diagnosis Date  . Anxiety   . Arthritis   . Bladder cancer ([place])   . Chronic kidney disease   . Depression   . Diabetes ([place])   . Dysrhythmia    atrial fib  . GERD (gastroesophageal reflux disease)   . Hypertension   . Restless leg syndrome    . Shortness of breath dyspnea    with exertion  . Skin cancer   . Sleep apnea    CPAP "once in awhile"    Past Surgical History:  Procedure Laterality Date  . APPENDECTOMY    . JOINT REPLACEMENT Left    Total Knee Replacement  . KNEE ARTHROSCOPY Left   . PORTA CATH INSERTION N/A 10/19/2017   Procedure: PORTA CATH INSERTION;  Surgeon: Algernon Huxley, MD;  Location: [place] [facility];  Service: Cardiovascular;  Laterality: N/A;  . REPLACEMENT TOTAL KNEE Left 2012   Dr. Little Ishikawa, Jr. [facility]  . TRANSURETHRAL RESECTION OF BLADDER TUMOR N/A 03/14/2016   Procedure: TRANSURETHRAL RESECTION OF BLADDER TUMOR (TURBT);  Surgeon: Hollice Espy, MD;  Location: [facility] ORS;  Service: Urology;  Laterality: N/A;    Home Medications:  Current Meds  Medication Sig  . acetaminophen (TYLENOL) 650 MG CR tablet Take 650 mg by mouth every 12 (twelve) hours as needed for pain.  Marland Kitchen ALPRAZolam (XANAX) 0.5 MG tablet Take 1 tablet (0.5 mg total) by mouth at bedtime as needed for anxiety. (Patient taking differently: Take 0.5 mg by mouth daily as needed for anxiety. )  . aspirin EC 81 MG tablet Take 81 mg by mouth every evening.   Marland Kitchen atorvastatin (LIPITOR) 40 MG tablet Take 40 mg by mouth in the evening  . carvedilol (COREG) 25 MG tablet Take 25 mg by mouth twice daily  .  ferrous sulfate (SLOW RELEASE IRON) 160 (50 Fe) MG TBCR SR tablet Take 160 mg by mouth 2 (two) times daily.   Marland Kitchen FLUoxetine (PROZAC) 10 MG capsule Take 10 mg by mouth daily.   Marland Kitchen gabapentin (NEURONTIN) 300 MG capsule Take 300 mg by mouth every evening.   Marland Kitchen glipiZIDE (GLUCOTROL) 10 MG tablet Take 10 mg by mouth in the evening  . lisinopril (PRINIVIL,ZESTRIL) 40 MG tablet Take 40 mg by mouth in the evening  . megestrol (MEGACE) 40 MG tablet Take 1 tablet (40 mg total) by mouth daily.  . Multiple Vitamin (MULTI-VITAMINS) TABS Take 1 tablet by mouth daily. Alive Mens Multivitamin  . Omega-3 Fatty Acids (FISH OIL PO) Take 1 capsule by mouth 2 (two)  times daily.   Marland Kitchen omeprazole (PRILOSEC) 20 MG capsule Take 20 mg by mouth in the evening  . patiromer (VELTASSA) 8.4 g packet Take 8.4 g by mouth 2 (two) times daily.  . pioglitazone (ACTOS) 45 MG tablet Take 45 mg by mouth in the morning    Allergies: No Known Allergies  Family History  Problem Relation Age of Onset  . Cancer Mother        Breast  . Heart failure Father   . Prostate cancer Neg Hx   . Bladder Cancer Neg Hx     Social History:  reports that he quit smoking about 40 years ago. His smoking use included cigarettes. He has a 20.00 pack-year smoking history. He has never used smokeless tobacco. He reports that he drinks about 1.2 oz of alcohol per week. He reports that he does not use drugs.  ROS: A complete review of systems was performed.  All systems are negative except for pertinent findings as noted.  Physical Exam:  Vital signs in last 24 hours: Temp:  [98.2 F (36.8 C)-98.7 F (37.1 C)] 98.7 F (37.1 C) (04/01 1337) Pulse Rate:  [92-96] 96 (04/01 1337) Resp:  [16-20] 20 (04/01 1337) BP: (99-111)/(51-68) 105/55 (04/01 1337) SpO2:  [90 %-97 %] 95 % (04/01 1337) Weight:  [326 lb (147.9 kg)] 326 lb (147.9 kg) (04/01 0954) Constitutional:  Alert and oriented, No acute distress.  Family at bedside. HEENT: [place] AT, moist mucus membranes.  Trachea midline, no masses Cardiovascular: Mild bilateral lower extremity edema. Respiratory: Normal respiratory effort, no increased work of breathing. GI: Abdomen is soft, nontender, nondistended, no abdominal masses GU: Circumcised phallus with 16 French Foley catheter in place draining clear yellow urine.  Testicles descended bilaterally. Rectal: Mildly decreased sphincter tone, unable to palpate prostate due to habitus and positioning. Neurologic: Grossly intact, no focal deficits, moving all 4 extremities but with symmetric bilateral lower extremity weakness noted.  Difficulty rolling for rectal exam. Psychiatric: Normal mood  and affect   Laboratory Data:  Recent Labs    10/23/17 1000  WBC 10.6  HGB 8.7*  HCT 27.1*   Recent Labs    10/23/17 1000  NA 129*  K 5.4*  CL 99*  CO2 19*  GLUCOSE 211*  BUN 77*  CREATININE 4.79*  CALCIUM 7.8*   No results for input(s): LABPT, INR in the last 72 hours. No results for input(s): LABURIN in the last 72 hours. Results for orders placed or performed in visit on 06/15/16  Microscopic Examination     Status: Abnormal   Collection Time: 06/15/16  9:12 AM  Result Value Ref Range Status   WBC, UA 0-5 0 - 5 /hpf Final   RBC, UA 3-10 (A) 0 - 2 /  hpf Final   Epithelial Cells (non renal) 0-10 0 - 10 /hpf Final   Bacteria, UA None seen None seen/Few Final    Radiologic Imaging: Dg Abdomen 1 View  Result Date: 10/23/2017 CLINICAL DATA:  Bladder carcinoma.  Pain and inability to urinate EXAM: ABDOMEN - 1 VIEW COMPARISON:  PET-CT September 21, 2017 FINDINGS: There is moderate stool throughout the colon. There is no bowel dilatation or air-fluid level to suggest bowel obstruction. No free air. There are probable phleboliths in the pelvis. Urinary bladder does not appear distended by radiography. IMPRESSION: No bowel obstruction or free air. Urinary bladder does not appear distended by radiography. Electronically Signed   By: Lowella Grip III M.D.   On: 10/23/2017 10:51   US Renal  Result Date: 10/23/2017 CLINICAL DATA:  Acute renal injury, history of metastatic bladder carcinoma EXAM: RENAL / URINARY TRACT ULTRASOUND COMPLETE COMPARISON:  07/14/2017. FINDINGS: Right Kidney: Length: 11.7 cm. Echogenicity within normal limits. No mass or hydronephrosis visualized. Left Kidney: Length: 12.1 cm. Cysts are noted within the left kidney. The largest of these measures 9.1 cm in greatest dimension. These are stable when compared with prior CT examination. No hydronephrosis is noted. Bladder: Appears normal for degree of bladder distention. Note is made of known lesions within the  right lobe of the liver. IMPRESSION: Left renal cystic change. Stable masses in the liver are noted. Electronically Signed   By: Inez Catalina M.D.   On: 10/23/2017 15:24    Renal ultrasound/ KUB personally reviewed today  Impression/Assessment:  72 year old male with massive urinary retention in the setting of advanced metastatic melanoma including to the lumbar spine, chronic constipation and lower extremity weakness.  Although retention is likely multifactorial, consolation of symptoms is somewhat concerning for underlying neurologic cause (ie cord compression)  Acute on chronic renal insufficiency likely multifactorial.  No evidence of hydronephrosis on renal ultrasound.  Personal history of bladder cancer without appropriate follow-up since 2017.  Given his poor prognosis with metastatic melanoma, will defer additional surveillance.  Plan:  -In the setting of massive urinary retention and acute on chronic renal failure, recommend leaving Foley catheter for at least 1 week, will arrange outpt voiding trial -Flomax 0.4 mg -Consider neurosurgical evaluation with constellation of above symptoms -Scheduled for radiation with Dr. Baruch Gouty, would contact his office to assess whether or not this could be done while in house  10/23/2017, 3:57 PM  Hollice Espy,  MD  Case discussed with Dr. Margaretmary Eddy, recommendations communicated

## 2017-10-23 NOTE — ED Provider Notes (Signed)
[facility] Emergency Department Provider Note   ____________________________________________    I have reviewed the triage vital signs and the nursing notes.   HISTORY  Chief Complaint Urinary Retention     HPI Tyler Henderson is a 72 y.o. male with a history of metastatic melanoma who follows with Dr. Grayland Ormond of oncology.  Patient reports he has not been able to urinate in over 24 hours, he clarifies suspect that he is been able to get out a few drops but when he tries to urinate nothing comes out.  Patient has known L5 metastatic disease with bilateral lower extremity weakness, there does not appear to be a significant change in this over the last several days.  He denies abdominal pain.  He also complains of constipation, he is not sure if he is on any new pain medications.  No fevers reported.  Apparently receiving palliative care based on medical records   Past Medical History:  Diagnosis Date  . Anxiety   . Arthritis   . Bladder cancer ([place])   . Chronic kidney disease   . Depression   . Diabetes ([place])   . Dysrhythmia    atrial fib  . GERD (gastroesophageal reflux disease)   . Hypertension   . Restless leg syndrome   . Shortness of breath dyspnea    with exertion  . Skin cancer   . Sleep apnea    CPAP "once in awhile"    Patient Active Problem List   Diagnosis Date Noted  . AKI (acute kidney injury) ([place]) 10/23/2017  . Metastatic melanoma ([place]) 10/15/2017  . Goals of care, counseling/discussion 10/15/2017  . Mass of upper lobe of left lung 10/08/2017  . Liver masses 09/17/2017  . Microscopic hematuria 03/07/2016  . Simple renal cyst 03/07/2016  . Nephrolithiasis 03/07/2016  . Chronic renal failure, stage 3 (moderate) ([place]) 03/07/2016    Past Surgical History:  Procedure Laterality Date  . APPENDECTOMY    . JOINT REPLACEMENT Left    Total Knee Replacement  . KNEE ARTHROSCOPY Left   . PORTA CATH INSERTION N/A 10/19/2017   Procedure: PORTA CATH INSERTION;  Surgeon: Algernon Huxley, MD;  Location: [place] [facility];  Service: Cardiovascular;  Laterality: N/A;  . REPLACEMENT TOTAL KNEE Left 2012   Dr. [facility] Jr. [facility]  . TRANSURETHRAL RESECTION OF BLADDER TUMOR N/A 03/14/2016   Procedure: TRANSURETHRAL RESECTION OF BLADDER TUMOR (TURBT);  Surgeon: Hollice Espy, MD;  Location: [facility] ORS;  Service: Urology;  Laterality: N/A;    Prior to Admission medications   Medication Sig Start Date End Date Taking? Authorizing Provider  acetaminophen (TYLENOL) 650 MG CR tablet Take 650 mg by mouth every 12 (twelve) hours as needed for pain.   Yes [provider]  ALPRAZolam Duanne Moron) 0.5 MG tablet Take 1 tablet (0.5 mg total) by mouth at bedtime as needed for anxiety. Patient taking differently: Take 0.5 mg by mouth daily as needed for anxiety.  10/02/17  Yes Lloyd Huger, MD  aspirin EC 81 MG tablet Take 81 mg by mouth every evening.    Yes [provider]  atorvastatin (LIPITOR) 40 MG tablet Take 40 mg by mouth in the evening 03/23/15  Yes [provider]  carvedilol (COREG) 25 MG tablet Take 25 mg by mouth twice daily 06/26/15  Yes [provider]  ferrous sulfate (SLOW RELEASE IRON) 160 (50 Fe) MG TBCR SR tablet Take 160 mg by mouth 2 (two) times daily.  Yes [provider]  FLUoxetine (PROZAC) 10 MG capsule Take 10 mg by mouth daily.  10/14/17  Yes [provider]  gabapentin (NEURONTIN) 300 MG capsule Take 300 mg by mouth every evening.    Yes [provider]  glipiZIDE (GLUCOTROL) 10 MG tablet Take 10 mg by mouth in the evening 03/23/15  Yes [provider]  lisinopril (PRINIVIL,ZESTRIL) 40 MG tablet Take 40 mg by mouth in the evening 06/25/15  Yes [provider]  megestrol (MEGACE) 40 MG tablet Take 1 tablet (40 mg total) by mouth daily. 10/13/17  Yes Lloyd Huger, MD  Multiple Vitamin (MULTI-VITAMINS) TABS Take 1 tablet by mouth  daily. Alive Mens Multivitamin   Yes [provider]  Omega-3 Fatty Acids (FISH OIL PO) Take 1 capsule by mouth 2 (two) times daily.    Yes [provider]  omeprazole (PRILOSEC) 20 MG capsule Take 20 mg by mouth in the evening 03/23/15  Yes [provider]  patiromer (VELTASSA) 8.4 g packet Take 8.4 g by mouth 2 (two) times daily.   Yes [provider]  pioglitazone (ACTOS) 45 MG tablet Take 45 mg by mouth in the morning 03/23/15  Yes [provider]  docusate sodium (COLACE) 100 MG capsule Take 1 capsule (100 mg total) by mouth 2 (two) times daily. Patient taking differently: Take 100-200 mg by mouth daily as needed for moderate constipation.  03/14/16   Hollice Espy, MD  lidocaine-prilocaine (EMLA) cream Apply to affected area once 10/15/17   Lloyd Huger, MD  Oxycodone HCl 10 MG TABS Take 1 tablet (10 mg total) by mouth 2 (two) times daily as needed. Patient taking differently: Take 10 mg by mouth 2 (two) times daily as needed (for pain).  10/13/17   Lloyd Huger, MD     Allergies Patient has no known allergies.  Family History  Problem Relation Age of Onset  . Cancer Mother        Breast  . Heart failure Father   . Prostate cancer Neg Hx   . Bladder Cancer Neg Hx     Social History Social History   Tobacco Use  . Smoking status: Former Smoker    Packs/day: 1.00    Years: 20.00    Pack years: 20.00    Types: Cigarettes    Last attempt to quit: 1979    Years since quitting: 40.2  . Smokeless tobacco: Never Used  Substance Use Topics  . Alcohol use: Yes    Alcohol/week: 1.2 oz    Types: 2 Cans of beer per week    Comment: occassional  . Drug use: No    Review of Systems  Constitutional: No fever/chills Eyes: No visual changes.  ENT: No sore throat. Cardiovascular: Denies chest pain. Respiratory: Denies shortness of breath. Gastrointestinal: No abdominal pain.   Genitourinary: As above Musculoskeletal:  Chronic low back pain Skin: Negative for rash. Neurological: Negative for headaches    ____________________________________________   PHYSICAL EXAM:  VITAL SIGNS: ED Triage Vitals [10/23/17 0954]  Enc Vitals Group     BP 111/68     Pulse Rate 96     Resp 16     Temp 98.2 F (36.8 C)     Temp Source Oral     SpO2 97 %     Weight (!) 147.9 kg (326 lb)     Height 1.854 m (6\' 1" )     Head Circumference      Peak Flow  Pain Score 0     Pain Loc      Pain Edu?      Excl. in [place]?     Constitutional: Alert and oriented. No acute distress. Pleasant and interactive Eyes: Conjunctivae are normal.   Nose: No congestion/rhinnorhea. Mouth/Throat: Mucous membranes are moist.   M Cardiovascular: Normal rate, regular rhythm. Grossly normal heart sounds.  Good peripheral circulation. Respiratory: Normal respiratory effort.  No retractions.  Gastrointestinal: Soft and nontender. No distention.  Genitourinary: Normal exam Musculoskeletal:.  Warm and well perfused Neurologic:  Normal speech and language. No gross focal neurologic deficits are appreciated.  Skin:  Skin is warm, dry and intact. No rash noted. Psychiatric: Mood and affect are normal. Speech and behavior are normal.  ____________________________________________   LABS (all labs ordered are listed, but only abnormal results are displayed)  Labs Reviewed  URINALYSIS, COMPLETE (UACMP) WITH MICROSCOPIC - Abnormal; Notable for the following components:      Result Value   Color, Urine AMBER (*)    APPearance CLEAR (*)    Hgb urine dipstick SMALL (*)    All other components within normal limits  BASIC METABOLIC PANEL - Abnormal; Notable for the following components:   Sodium 129 (*)    Potassium 5.4 (*)    Chloride 99 (*)    CO2 19 (*)    Glucose, Bld 211 (*)    BUN 77 (*)    Creatinine, Ser 4.79 (*)    Calcium 7.8 (*)    GFR calc non Af Amer 11 (*)    GFR calc Af Amer 13 (*)    All other components within  normal limits  CBC - Abnormal; Notable for the following components:   RBC 3.20 (*)    Hemoglobin 8.7 (*)    HCT 27.1 (*)    All other components within normal limits   ____________________________________________  EKG  None ____________________________________________  RADIOLOGY  Bladder scan shows greater than 950 cc ____________________________________________   PROCEDURES  Procedure(s) performed: No  Procedures   Critical Care performed: No ____________________________________________   INITIAL IMPRESSION / ASSESSMENT AND PLAN / ED COURSE  Pertinent labs & imaging results that were available during my care of the patient were reviewed by me and considered in my medical decision making (see chart for details).  Presents with constipation and urinary retention.  Urinary retention may be related to constipation or could be secondary to neurogenic bladder although this seems less likely.  Foley catheter placed given greater than 150 cc, will check renal function and KUB to evaluate constipation.  Patient with significant increase in creatinine, likely combination of prerenal and postrenal.  Discussed with oncology who agrees with admission, discussed with hospitalist, IV fluids infusing    ____________________________________________   FINAL CLINICAL IMPRESSION(S) / ED DIAGNOSES  Final diagnoses:  Urinary retention  Dehydration  Acute kidney injury [facility])        Note:  This document was prepared using Dragon voice recognition software and may include unintentional dictation errors.    Lavonia Drafts, MD 10/23/17 1431

## 2017-10-23 NOTE — Progress Notes (Signed)
Discussed with RN to find out why STAT MRI L-spine not done yet, she will call the MRI and talk to them to get it done ASAP

## 2017-10-23 NOTE — Consult Note (Addendum)
Hematology/Oncology Consult note [facility] Telephone:([phone] [phone]  Patient Care Team: Sofie Hartigan, MD as PCP - General (Family Medicine)   Name of the patient: Tyler Henderson  [id number]  March 06, 1946   Date of visit: 10/23/17 REASON FOR COSULTATION:  Metastatic melanoma.  History of presenting illness8  72 yo male with known history of metastatic melanoma with liver, lung,bone involvement, history of bladder cancer presents to ED with compliant of weakness and decreased urination for the past few days.  Patient is status post 1 dose of [place] map and ipilimumab last Friday.  patient was found to have urinary retention in the emergency room and a Foley catheter was placed and the patient had 900,000 urine output.  Creatinine function also.  No hydronephrosis.  Report last bowel movement was 3-4 days ago.  Patient was supposed to start spine today and up being admitted to the hospital.  Reports feeling fatigued   Review of systems- Review of Systems  Constitutional: Positive for malaise/fatigue. Negative for chills and fever.  HENT: Negative for nosebleeds.   Eyes: Negative for discharge.  Respiratory: Negative for cough and hemoptysis.   Cardiovascular: Negative for chest pain and claudication.  Gastrointestinal: Positive for constipation. Negative for abdominal pain, blood in stool, nausea and vomiting.  Genitourinary: Negative for dysuria.       Decreased urine output  Musculoskeletal: Negative for myalgias and neck pain.  Skin: Negative for rash.  Neurological: Negative for dizziness.  Endo/Heme/Allergies: Does not bruise/bleed easily.  Psychiatric/Behavioral: Negative for depression.    No Known Allergies  Patient Active Problem List   Diagnosis Date Noted  . AKI (acute kidney injury) ([place]) 10/23/2017  . Metastatic melanoma ([place]) 10/15/2017  . Goals of care, counseling/discussion 10/15/2017  . Mass of upper lobe of left  lung 10/08/2017  . Liver masses 09/17/2017  . Microscopic hematuria 03/07/2016  . Simple renal cyst 03/07/2016  . Nephrolithiasis 03/07/2016  . Chronic renal failure, stage 3 (moderate) ([place]) 03/07/2016     Past Medical History:  Diagnosis Date  . Anxiety   . Arthritis   . Bladder cancer ([place])   . Chronic kidney disease   . Depression   . Diabetes ([place])   . Dysrhythmia    atrial fib  . GERD (gastroesophageal reflux disease)   . Hypertension   . Restless leg syndrome   . Shortness of breath dyspnea    with exertion  . Skin cancer   . Sleep apnea    CPAP "once in awhile"     Past Surgical History:  Procedure Laterality Date  . APPENDECTOMY    . JOINT REPLACEMENT Left    Total Knee Replacement  . KNEE ARTHROSCOPY Left   . PORTA CATH INSERTION N/A 10/19/2017   Procedure: PORTA CATH INSERTION;  Surgeon: Algernon Huxley, MD;  Location: [place] [facility];  Service: Cardiovascular;  Laterality: N/A;  . REPLACEMENT TOTAL KNEE Left 2012   Dr. Little Ishikawa, Jr. [facility]  . TRANSURETHRAL RESECTION OF BLADDER TUMOR N/A 03/14/2016   Procedure: TRANSURETHRAL RESECTION OF BLADDER TUMOR (TURBT);  Surgeon: Hollice Espy, MD;  Location: [facility] ORS;  Service: Urology;  Laterality: N/A;    Social History   Socioeconomic History  . Marital status: Married    Spouse name: Not on file  . Number of children: Not on file  . Years of education: Not on file  . Highest education level: Not on file  Occupational History  . Not on file  Social Needs  . Financial resource strain: Not on file  . Food insecurity:    Worry: Not on file    Inability: Not on file  . Transportation needs:    Medical: Not on file    Non-medical: Not on file  Tobacco Use  . Smoking status: Former Smoker    Packs/day: 1.00    Years: 20.00    Pack years: 20.00    Types: Cigarettes    Last attempt to quit: 1979    Years since quitting: 40.2  . Smokeless tobacco: Never Used  Substance and Sexual Activity  .  Alcohol use: Yes    Alcohol/week: 1.2 oz    Types: 2 Cans of beer per week    Comment: occassional  . Drug use: No  . Sexual activity: Not on file  Lifestyle  . Physical activity:    Days per week: Not on file    Minutes per session: Not on file  . Stress: Not on file  Relationships  . Social connections:    Talks on phone: Not on file    Gets together: Not on file    Attends religious service: Not on file    Active member of club or organization: Not on file    Attends meetings of clubs or organizations: Not on file    Relationship status: Not on file  . Intimate partner violence:    Fear of current or ex partner: Not on file    Emotionally abused: Not on file    Physically abused: Not on file    Forced sexual activity: Not on file  Other Topics Concern  . Not on file  Social History Narrative  . Not on file     Family History  Problem Relation Age of Onset  . Cancer Mother        Breast  . Heart failure Father   . Prostate cancer Neg Hx   . Bladder Cancer Neg Hx      Current Facility-Administered Medications:  .  0.9 %  sodium chloride infusion, , Intravenous, Continuous, Gouru, Aruna, MD, Last Rate: 75 mL/hr at 10/23/17 1549 .  acetaminophen (TYLENOL) tablet 325 mg, 325 mg, Oral, Q6H PRN, Gouru, Aruna, MD .  ALPRAZolam Duanne Moron) tablet 0.5 mg, 0.5 mg, Oral, QHS PRN, Gouru, Aruna, MD .  aspirin EC tablet 81 mg, 81 mg, Oral, QPM, Gouru, Aruna, MD .  atorvastatin (LIPITOR) tablet 40 mg, 40 mg, Oral, q1800, Gouru, Aruna, MD .  carvedilol (COREG) tablet 25 mg, 25 mg, Oral, BID WC, Gouru, Aruna, MD .  docusate sodium (COLACE) capsule 100 mg, 100 mg, Oral, BID, Gouru, Aruna, MD .  enoxaparin (LOVENOX) injection 30 mg, 30 mg, Subcutaneous, Q12H, Gouru, Aruna, MD .  Derrill Memo ON 10/24/2017] ferrous sulfate tablet 325 mg, 325 mg, Oral, Q breakfast, Gouru, Aruna, MD .  FLUoxetine (PROZAC) capsule 10 mg, 10 mg, Oral, Daily, Gouru, Aruna, MD .  gabapentin (NEURONTIN) capsule 300 mg,  300 mg, Oral, QPM, Gouru, Aruna, MD .  glipiZIDE (GLUCOTROL) tablet 10 mg, 10 mg, Oral, QAC breakfast, Gouru, Aruna, MD .  insulin aspart (novoLOG) injection 0-5 Units, 0-5 Units, Subcutaneous, QHS, Gouru, Aruna, MD .  insulin aspart (novoLOG) injection 0-9 Units, 0-9 Units, Subcutaneous, TID WC, Gouru, Aruna, MD, 1 Units at 10/23/17 1715 .  megestrol (MEGACE) tablet 40 mg, 40 mg, Oral, Daily, Gouru, Aruna, MD .  omega-3 acid ethyl esters (LOVAZA) capsule 1 g, 1 g, Oral, Daily, Gouru, Aruna, MD, 1  g at 10/23/17 1715 .  ondansetron (ZOFRAN) tablet 4 mg, 4 mg, Oral, Q6H PRN **OR** ondansetron (ZOFRAN) injection 4 mg, 4 mg, Intravenous, Q6H PRN, Gouru, Aruna, MD .  oxyCODONE (Oxy IR/ROXICODONE) immediate release tablet 10 mg, 10 mg, Oral, BID PRN, Gouru, Aruna, MD .  pantoprazole (PROTONIX) EC tablet 40 mg, 40 mg, Oral, Daily, Gouru, Aruna, MD .  patiromer (VELTASSA) packet 8.4 g, 8.4 g, Oral, BID, Gouru, Aruna, MD .  Derrill Memo ON 10/24/2017] pneumococcal 23 valent vaccine (PNU-IMMUNE) injection 0.5 mL, 0.5 mL, Intramuscular, Tomorrow-1000, Gouru, Aruna, MD .  tamsulosin (FLOMAX) capsule 0.4 mg, 0.4 mg, Oral, QPC supper, Kolluru, Lurena Nida, MD   Physical exam:  Vitals:   10/23/17 1130 10/23/17 1230 10/23/17 1300 10/23/17 1337  BP: (!) 103/51 (!) 99/51 (!) 110/57 (!) 105/55  Pulse: 93 92 93 96  Resp: 18   20  Temp:    98.7 F (37.1 C)  TempSrc:    Oral  SpO2: 90% 96% 96% 95%  Weight:      Height:       GENERAL:Alert, no distress and comfortable.  EYES: no pallor or icterus OROPHARYNX: no thrush or ulceration;   NECK: supple, no masses felt LUNGS: clear to auscultation and  No wheeze or crackles HEART/CVS: regular rate & rhythm and no murmurs;  ABDOMEN: abdomen soft, non-tender and normal bowel sounds Musculoskeletal:no cyanosis of digits and no clubbing  PSYCH: alert & oriented x 3  NEURO: Cranial nerves II-XII intact, bilateral lower extremities weakness, 3/5. Gait not checked.  SKIN:  no  rashes or significant lesions     CMP Latest Ref Rng & Units 10/23/2017  Glucose 65 - 99 mg/dL 211(H)  BUN 6 - 20 mg/dL 77(H)  Creatinine 0.61 - 1.24 mg/dL 4.79(H)  Sodium 135 - 145 mmol/L 129(L)  Potassium 3.5 - 5.1 mmol/L 5.4(H)  Chloride 101 - 111 mmol/L 99(L)  CO2 22 - 32 mmol/L 19(L)  Calcium 8.9 - 10.3 mg/dL 7.8(L)  Total Protein 6.5 - 8.1 g/dL -  Total Bilirubin 0.3 - 1.2 mg/dL -  Alkaline Phos 38 - 126 U/L -  AST 15 - 41 U/L -  ALT 17 - 63 U/L -   CBC Latest Ref Rng & Units 10/23/2017  WBC 3.8 - 10.6 K/uL 10.6  Hemoglobin 13.0 - 18.0 g/dL 8.7(L)  Hematocrit 40.0 - 52.0 % 27.1(L)  Platelets 150 - 440 K/uL 299   Dg Abdomen 1 View  Result Date: 10/23/2017 CLINICAL DATA:  Bladder carcinoma.  Pain and inability to urinate EXAM: ABDOMEN - 1 VIEW COMPARISON:  PET-CT September 21, 2017 FINDINGS: There is moderate stool throughout the colon. There is no bowel dilatation or air-fluid level to suggest bowel obstruction. No free air. There are probable phleboliths in the pelvis. Urinary bladder does not appear distended by radiography. IMPRESSION: No bowel obstruction or free air. Urinary bladder does not appear distended by radiography. Electronically Signed   By: Lowella Grip III M.D.   On: 10/23/2017 10:51   Mr Jeri Cos IR Contrast  Result Date: 10/11/2017 CLINICAL DATA:  Bladder cancer with metastatic disease to the liver, skeleton, and lungs. EXAM: MRI HEAD WITHOUT AND WITH CONTRAST TECHNIQUE: Multiplanar, multiecho pulse sequences of the brain and surrounding structures were obtained without and with intravenous contrast. CONTRAST:  57mL MULTIHANCE GADOBENATE DIMEGLUMINE 529 MG/ML IV SOLN COMPARISON:  PET scan 09/21/2017. FINDINGS: Brain: No focal enhancing lesions are present to suggest metastatic disease the brain or meninges. Minimal white matter changes are  within normal limits for age. No acute infarct, hemorrhage, or mass lesion is present. Ventricles are of normal size. The  internal auditory canals are within normal limits bilaterally. The brainstem and cerebellum are normal. No significant extra-axial fluid collection is present. Vascular: Flow is present in the major intracranial arteries. Globes and orbits are at that Skull and upper cervical spine: The calvarium is intact. Focal lesions or enhancement are evident. The skull base is within normal limits. The craniocervical junction is normal. Sinuses/Orbits: Mild mucosal thickening is present along the floor of the maxillary sinuses bilaterally. No fluid levels are present. The paranasal sinuses and mastoid air cells are otherwise clear. Globes and orbits are within normal limits. IMPRESSION: 1. No evidence for metastatic disease to the brain or meninges. 2. Normal MRI appearance of the brain for age. 3. Minimal maxillary sinus disease. Electronically Signed   By: San Morelle M.D.   On: 10/11/2017 11:54   US Renal  Result Date: 10/23/2017 CLINICAL DATA:  Acute renal injury, history of metastatic bladder carcinoma EXAM: RENAL / URINARY TRACT ULTRASOUND COMPLETE COMPARISON:  07/14/2017. FINDINGS: Right Kidney: Length: 11.7 cm. Echogenicity within normal limits. No mass or hydronephrosis visualized. Left Kidney: Length: 12.1 cm. Cysts are noted within the left kidney. The largest of these measures 9.1 cm in greatest dimension. These are stable when compared with prior CT examination. No hydronephrosis is noted. Bladder: Appears normal for degree of bladder distention. Note is made of known lesions within the right lobe of the liver. IMPRESSION: Left renal cystic change. Stable masses in the liver are noted. Electronically Signed   By: Inez Catalina M.D.   On: 10/23/2017 15:24   US Biopsy (liver)  Result Date: 10/06/2017 INDICATION: Concern for metastatic lung cancer, now with multiple hypermetabolic liver lesions. Please perform ultrasound-guided liver lesion biopsy for tissue diagnostic purposes. EXAM: ULTRASOUND GUIDED  LIVER LESION BIOPSY COMPARISON:  PET-CT - 09/21/2017 MEDICATIONS: None ANESTHESIA/SEDATION: Fentanyl 50 mcg IV; Versed 2 mg IV Total Moderate Sedation time:  14 Minutes. The patient's level of consciousness and vital signs were monitored continuously by radiology nursing throughout the procedure under my direct supervision. COMPLICATIONS: None immediate. PROCEDURE: Informed written consent was obtained from the patient after a discussion of the risks, benefits and alternatives to treatment. The patient understands and consents the procedure. A timeout was performed prior to the initiation of the procedure. Ultrasound scanning was performed of the right upper abdominal quadrant demonstrates multiple mixed echogenic lesions and masses within both the right and left lobes of the liver. An approximately 3.2 x 2.5 cm hypoechoic lesion/mass within the medial segment of left lobe liver correlating with the hypoattenuating lesion seen on preceding PET-CT image 153, series 4, was targeted for biopsy given lesion location and sonographic window. The procedure was planned. The right upper abdominal quadrant was prepped and draped in the usual sterile fashion. The overlying soft tissues were anesthetized with 1% lidocaine with epinephrine. A 17 gauge, 6.8 cm co-axial needle was advanced into a peripheral aspect of the lesion. This was followed by 5 core biopsies with an 18 gauge core device under direct ultrasound guidance. The coaxial needle tract was embolized with a small amount of Gel-Foam slurry and superficial hemostasis was obtained with manual compression. Post procedural scanning was negative for definitive area of hemorrhage or additional complication. A dressing was placed. The patient tolerated the procedure well without immediate post procedural complication. IMPRESSION: Technically successful ultrasound guided core needle biopsy of indeterminate lesion/mass within the medial  segment of the left lobe of the liver.  Electronically Signed   By: [name] Mariscal M.D.   On: 10/06/2017 12:02    Assessment and plan- Patient is a 72 y.o. male with known history of metastatic melanoma with liver, lung, bone involvement presented with urinary retention, acute kidney injury on chronic kidney disease, and generalized weakness.  #Urinary retention, AK I on CKD: Agree with continue Foley catheter, monitor urine output.  Renal ultrasound showed no acute findings.  Appreciate urology Concerning about spinal cord compression.  I discussed with Dr. Margaretmary Eddy and agree with STAT MRI of lumbar spine.  Administered Decadron 10 mg IV x1.  If he does have cord compression, recommend Decadron 4 mg IV scheduled, consulting neurosurgery Consult radiation oncologist for evaluation of inpatient radiation.Plan was discussed with patient, his wife and daughter.   #Metastatic melanoma: He just received 1 dose of and ipilimumab.  Outpatient follow-up with Dr. Grayland Ormond Above plan discussed with Dr.Gouru and patient's RN Meghan.    Earlie Server, MD, PhD Hematology Oncology [facility] at [facility] Pager- [phone] 10/23/2017

## 2017-10-24 ENCOUNTER — Inpatient Hospital Stay: Payer: Medicare Other

## 2017-10-24 ENCOUNTER — Encounter: Payer: Self-pay | Admitting: Neurological Surgery

## 2017-10-24 ENCOUNTER — Telehealth: Payer: Self-pay | Admitting: Urology

## 2017-10-24 DIAGNOSIS — C7951 Secondary malignant neoplasm of bone: Secondary | ICD-10-CM

## 2017-10-24 DIAGNOSIS — C439 Malignant melanoma of skin, unspecified: Secondary | ICD-10-CM

## 2017-10-24 DIAGNOSIS — C7989 Secondary malignant neoplasm of other specified sites: Secondary | ICD-10-CM

## 2017-10-24 DIAGNOSIS — C799 Secondary malignant neoplasm of unspecified site: Secondary | ICD-10-CM

## 2017-10-24 HISTORY — DX: Secondary malignant neoplasm of bone: C79.51

## 2017-10-24 LAB — COMPREHENSIVE METABOLIC PANEL
ALT: 14 U/L — ABNORMAL LOW (ref 17–63)
AST: 28 U/L (ref 15–41)
Albumin: 2.3 g/dL — ABNORMAL LOW (ref 3.5–5.0)
Alkaline Phosphatase: 230 U/L — ABNORMAL HIGH (ref 38–126)
Anion gap: 11 (ref 5–15)
BUN: 78 mg/dL — AB (ref 6–20)
CHLORIDE: 105 mmol/L (ref 101–111)
CO2: 18 mmol/L — ABNORMAL LOW (ref 22–32)
Calcium: 7.8 mg/dL — ABNORMAL LOW (ref 8.9–10.3)
Creatinine, Ser: 4.11 mg/dL — ABNORMAL HIGH (ref 0.61–1.24)
GFR, EST AFRICAN AMERICAN: 15 mL/min — AB (ref >60)
GFR, EST NON AFRICAN AMERICAN: 13 mL/min — AB (ref >60)
Glucose, Bld: 239 mg/dL — ABNORMAL HIGH (ref 65–99)
POTASSIUM: 5.1 mmol/L (ref 3.5–5.1)
Sodium: 134 mmol/L — ABNORMAL LOW (ref 135–145)
Total Bilirubin: 0.6 mg/dL (ref 0.3–1.2)
Total Protein: 6.1 g/dL — ABNORMAL LOW (ref 6.5–8.1)

## 2017-10-24 LAB — CBC
HCT: 25.8 % — ABNORMAL LOW (ref 40.0–52.0)
Hemoglobin: 8.3 g/dL — ABNORMAL LOW (ref 13.0–18.0)
MCH: 26.9 pg (ref 26.0–34.0)
MCHC: 32.3 g/dL (ref 32.0–36.0)
MCV: 83.3 fL (ref 80.0–100.0)
PLATELETS: 266 10*3/uL (ref 150–440)
RBC: 3.1 MIL/uL — ABNORMAL LOW (ref 4.40–5.90)
RDW: 14 % (ref 11.5–14.5)
WBC: 8.3 10*3/uL (ref 3.8–10.6)

## 2017-10-24 LAB — GLUCOSE, CAPILLARY
GLUCOSE-CAPILLARY: 216 mg/dL — AB (ref 65–99)
Glucose-Capillary: 315 mg/dL — ABNORMAL HIGH (ref 65–99)
Glucose-Capillary: 318 mg/dL — ABNORMAL HIGH (ref 65–99)
Glucose-Capillary: 313 mg/dL — ABNORMAL HIGH (ref 65–99)

## 2017-10-24 LAB — HEMOGLOBIN A1C
HEMOGLOBIN A1C: 8.3 % — AB (ref 4.8–5.6)
MEAN PLASMA GLUCOSE: 191.51 mg/dL

## 2017-10-24 MED ORDER — DEXAMETHASONE SODIUM PHOSPHATE 10 MG/ML IJ SOLN
10.0000 mg | Freq: Once | INTRAMUSCULAR | Status: AC
Start: 2017-10-24 — End: 2017-10-24
  Administered 2017-10-24: 02:00:00 10 mg via INTRAVENOUS
  Filled 2017-10-24: qty 1

## 2017-10-24 MED ORDER — LIDOCAINE-PRILOCAINE 2.5-2.5 % EX CREA
TOPICAL_CREAM | Freq: Once | CUTANEOUS | Status: DC
  Filled 2017-10-24: qty 5

## 2017-10-24 MED ORDER — FLEET ENEMA 7-19 GM/118ML RE ENEM: 1.0000 | ENEMA | Freq: Once | RECTAL | Status: DC

## 2017-10-24 MED ORDER — SENNOSIDES-DOCUSATE SODIUM 8.6-50 MG PO TABS
1.0000 | ORAL_TABLET | Freq: Two times a day (BID) | ORAL | Status: DC
  Administered 2017-10-24 – 2017-10-26 (×4): 1 via ORAL
  Filled 2017-10-24 (×4): qty 1

## 2017-10-24 MED ORDER — BISACODYL 10 MG RE SUPP
10.0000 mg | Freq: Every day | RECTAL | Status: DC
  Administered 2017-10-24: 13:00:00 10 mg via RECTAL
  Filled 2017-10-24 (×2): qty 1

## 2017-10-24 MED ORDER — DOCUSATE SODIUM 100 MG PO CAPS
100.0000 mg | ORAL_CAPSULE | Freq: Two times a day (BID) | ORAL | Status: DC
Start: 2017-10-24 — End: 2017-10-25
  Administered 2017-10-25: 100 mg via ORAL
  Filled 2017-10-24 (×2): qty 1

## 2017-10-24 MED ORDER — SENNA 8.6 MG PO TABS: 1.0000 | ORAL_TABLET | Freq: Every day | ORAL | Status: DC

## 2017-10-24 MED ORDER — SODIUM CHLORIDE 0.9% FLUSH: 10.0000 mL | INTRAVENOUS | Status: DC | PRN

## 2017-10-24 MED ORDER — ENOXAPARIN SODIUM 30 MG/0.3ML ~~LOC~~ SOLN
30.0000 mg | SUBCUTANEOUS | Status: DC
  Administered 2017-10-24 – 2017-10-25 (×2): 30 mg via SUBCUTANEOUS
  Filled 2017-10-24 (×2): qty 0.3

## 2017-10-24 MED ORDER — LACTULOSE 10 GM/15ML PO SOLN
30.0000 g | Freq: Two times a day (BID) | ORAL | Status: DC
  Administered 2017-10-24: 30 g via ORAL
  Filled 2017-10-24 (×2): qty 60

## 2017-10-24 NOTE — Progress Notes (Signed)
Pt requested an enema for constipation. Pt received colace, dulcolax, and lactulose prior. Having leakage, but no BM to this point. Pt reporting rectal pain. MD notified. Soap sud enema ordered.

## 2017-10-24 NOTE — Progress Notes (Signed)
[facility]  ROUNDING NOTE   Subjective:   Wife at bedside.   Creatinine 4.11 (4.79)  UOP 4 liters.   Objective:  Vital signs in last 24 hours:  Temp:  [97.5 F (36.4 C)-98.9 F (37.2 C)] 98.5 F (36.9 C) (04/02 0820) Pulse Rate:  [93-104] 103 (04/02 0820) Resp:  [17-22] 22 (04/02 0820) BP: (105-144)/(55-72) 144/72 (04/02 0820) SpO2:  [92 %-98 %] 98 % (04/02 0820)  Weight change:  Filed Weights   10/23/17 0954  Weight: (!) 147.9 kg (326 lb)    Intake/Output: I/O last 3 completed shifts: In: 753.8 [P.O.:240; I.V.:13.8; IV Piggyback:500] Out: 4000 [Urine:4000]   Intake/Output this shift:  Total I/O In: 240 [P.O.:240] Out: 2200 [Urine:2200]  Physical Exam: General: NAD, laying in bed  Head: Normocephalic, atraumatic. Moist oral mucosal membranes  Eyes: Anicteric, PERRL  Neck: Supple, trachea midline  Lungs:  Clear to auscultation  Heart: Regular rate and rhythm  Abdomen:  Soft, nontender,   Extremities: 1+ peripheral edema.  Neurologic: Nonfocal, moving all four extremities  Skin: No lesions  GU: Foley with clear urine    Basic Metabolic Panel: Recent Labs  Lab 10/20/17 0909 10/23/17 1000 10/24/17 0448  NA 136 129* 134*  K 4.4 5.4* 5.1  CL 103 99* 105  CO2 22 19* 18*  GLUCOSE 234* 211* 239*  BUN 49* 77* 78*  CREATININE 2.78* 4.79* 4.11*  CALCIUM 8.5* 7.8* 7.8*    Liver Function Tests: Recent Labs  Lab 10/20/17 0909 10/24/17 0448  AST 29 28  ALT 20 14*  ALKPHOS 251* 230*  BILITOT 0.4 0.6  PROT 6.6 6.1*  ALBUMIN 2.6* 2.3*   No results for input(s): LIPASE, AMYLASE in the last 168 hours. No results for input(s): AMMONIA in the last 168 hours.  CBC: Recent Labs  Lab 10/20/17 0909 10/23/17 1000 10/24/17 0448  WBC 7.9 10.6 8.3  NEUTROABS 6.4  --   --   HGB 8.6* 8.7* 8.3*  HCT 26.5* 27.1* 25.8*  MCV 84.7 84.7 83.3  PLT 371 299 266    Cardiac Enzymes: No results for input(s): CKTOTAL, CKMB, CKMBINDEX, TROPONINI in  the last 168 hours.  BNP: Invalid input(s): POCBNP  CBG: Recent Labs  Lab 10/19/17 0900 10/23/17 1636 10/23/17 2112 10/24/17 0737 10/24/17 1135  GLUCAP 203* 141* 205* 216* 315*    Microbiology: Results for orders placed or performed in visit on 06/15/16  Microscopic Examination     Status: Abnormal   Collection Time: 06/15/16  9:12 AM  Result Value Ref Range Status   WBC, UA 0-5 0 - 5 /hpf Final   RBC, UA 3-10 (A) 0 - 2 /hpf Final   Epithelial Cells (non renal) 0-10 0 - 10 /hpf Final   Bacteria, UA None seen None seen/Few Final    Coagulation Studies: No results for input(s): LABPROT, INR in the last 72 hours.  Urinalysis: Recent Labs    10/23/17 1000  COLORURINE AMBER*  LABSPEC 1.014  PHURINE 5.0  GLUCOSEU NEGATIVE  HGBUR SMALL*  BILIRUBINUR NEGATIVE  KETONESUR NEGATIVE  PROTEINUR NEGATIVE  NITRITE NEGATIVE  LEUKOCYTESUR NEGATIVE      Imaging: Dg Abdomen 1 View  Result Date: 10/23/2017 CLINICAL DATA:  Bladder carcinoma.  Pain and inability to urinate EXAM: ABDOMEN - 1 VIEW COMPARISON:  PET-CT September 21, 2017 FINDINGS: There is moderate stool throughout the colon. There is no bowel dilatation or air-fluid level to suggest bowel obstruction. No free air. There are probable phleboliths in the pelvis.  Urinary bladder does not appear distended by radiography. IMPRESSION: No bowel obstruction or free air. Urinary bladder does not appear distended by radiography. Electronically Signed   By: Lowella Grip III M.D.   On: 10/23/2017 10:51   Mr Lumbar Spine Wo Contrast  Result Date: 10/24/2017 CLINICAL DATA:  Difficulty urinating for 3 days. History of metastatic melanoma, bladder cancer and chronic kidney disease. EXAM: MRI LUMBAR SPINE WITHOUT CONTRAST TECHNIQUE: Multiplanar, multisequence MR imaging of the lumbar spine was performed. No intravenous contrast was administered. COMPARISON:  PET CT September 21, 2017 FINDINGS: SEGMENTATION: For the purposes of this  report, the last well-formed intervertebral disc is reported as L5-S1. ALIGNMENT: Maintained lumbar lordosis. No malalignment. VERTEBRAE:30 mm low T1, bright STIR signal mass RIGHT L4 vertebral body extending to the pedicle with mild acute fracture, less than 15% height loss superior endplate associated with Schmorl's node. Low T1, bright STIR signal RIGHT T12 facet consistent with metastasis. Ovoid subcentimeter low T1, bright STIR signal inferior to LEFT L2 pedicle, likely extra osseous cyst. Remaining vertebral bodies intact. Congenital canal narrowing on the basis of foreshortened pedicles. Moderate to severe L5-S1 disc height loss with mild chronic discogenic endplate changes. Mild disc desiccation L2-3 through L5-S1. CONUS MEDULLARIS AND CAUDA EQUINA: Conus medullaris terminates at L1-2 and demonstrates normal morphology and signal characteristics. Cauda equina is normal. PARASPINAL AND OTHER SOFT TISSUES: T2 bright cyst LEFT kidney. Mild bright interstitial STIR signal bilateral paraspinal muscles seen with low-grade strain. 9 mm round cystic lesion RIGHT paraspinal soft tissues fluid fluid level. At least 4.8 cm mass contiguous with RIGHT sacroiliac joint undermining the ileo psoas muscles. DISC LEVELS: L1-2, L2-3: No disc bulge, canal stenosis nor neural foraminal narrowing. Mild facet arthropathy. L3-4: RIGHT L4 osseous metastasis with a 9 x 7 mm tumoral extension to RIGHT lateral recess displacing the traversing RIGHT L4 nerve. Small broad-based disc bulge and mild facet arthropathy. Mild canal stenosis. Mild bilateral neural foraminal narrowing. L4-5: Small broad-based disc bulge, annular fissure. Mild facet arthropathy and ligamentum flavum redundancy. Moderate canal stenosis. Moderate RIGHT, mild LEFT neural foraminal narrowing. L5-S1: Moderate broad-based disc bulge. Mild facet arthropathy and ligamentum flavum redundancy. No canal stenosis. Mild to moderate bilateral neural foraminal narrowing.  IMPRESSION: 1. RIGHT L4 metastasis with mild pathologic fracture, tumor expansion into epidural space resulting in traversing RIGHT L4 nerve impingement. RIGHT T12 facet metastasis. 2. 2 subcentimeter cystic paraspinal muscle masses concerning for metastatic deposits. At least 4.8 cm RIGHT pelvic metastasis. 3. 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 Moderate canal stenosis L4-5, mild canal stenosis L3-4. 4. Neural foraminal narrowing L3-4 through L5-S1: Moderate on the RIGHT at L4-5. Electronically Signed   By: Elon Alas M.D.   On: 10/24/2017 01:31   US Renal  Result Date: 10/23/2017 CLINICAL DATA:  Acute renal injury, history of metastatic bladder carcinoma EXAM: RENAL / URINARY TRACT ULTRASOUND COMPLETE COMPARISON:  07/14/2017. FINDINGS: Right Kidney: Length: 11.7 cm. Echogenicity within normal limits. No mass or hydronephrosis visualized. Left Kidney: Length: 12.1 cm. Cysts are noted within the left kidney. The largest of these measures 9.1 cm in greatest dimension. These are stable when compared with prior CT examination. No hydronephrosis is noted. Bladder: Appears normal for degree of bladder distention. Note is made of known lesions within the right lobe of the liver. IMPRESSION: Left renal cystic change. Stable masses in the liver are noted. Electronically Signed   By: Inez Catalina M.D.   On: 10/23/2017 15:24  Medications:    . aspirin EC  81 mg Oral QPM  . atorvastatin  40 mg Oral q1800  . bisacodyl  10 mg Rectal Daily  . carvedilol  25 mg Oral BID WC  . docusate sodium  100 mg Oral BID  . enoxaparin (LOVENOX) injection  30 mg Subcutaneous Q24H  . ferrous sulfate  325 mg Oral Q breakfast  . FLUoxetine  10 mg Oral Daily  . gabapentin  300 mg Oral QPM  . glipiZIDE  10 mg Oral QAC breakfast  . insulin aspart  0-5 Units Subcutaneous QHS  . insulin aspart  0-9 Units Subcutaneous TID WC  . lactulose  30 g Oral BID  . lidocaine-prilocaine    Topical Once  . megestrol  40 mg Oral Daily  . omega-3 acid ethyl esters  1 g Oral Daily  . pantoprazole  40 mg Oral Daily  . patiromer  8.4 g Oral BID  . senna  1 tablet Oral Daily  . senna-docusate  1 tablet Oral BID  . tamsulosin  0.4 mg Oral QPC supper   acetaminophen, ALPRAZolam, ondansetron **OR** ondansetron (ZOFRAN) IV, oxyCODONE  Assessment/ Plan:  Mr. GRADIE OHM is a 72 y.o. white male with diabetes mellitus type 2, GERD, osteoarthritis, hypertension, hyperlipidemia, obstructive sleep apnea  1. Acute renal failure with hyponatremia, metabolic acidosis and hyperkalemia on chronic kidney disease stage IV with proteinuria. Baseline creatinine of 2.1, GFR of 30 on 10/16/17 Acute renal failure secondary to obstructive uropathy Chronic kidney disease secondary to metastatic melanoma, diabetes and hypertension - Appreciate urology input: indwelling catheter placed by Urology, Dr. Erlene Quan, on 4/1 - Continue NS  - No acute indication for dialysis.    LOS: 1 Tyler Henderson 4/2/201912:59 PM

## 2017-10-24 NOTE — Clinical Social Work Note (Signed)
CSW received consult for possible placement, CSW continuing to follow awaiting PT recommendations.  Jones Broom. Norval Morton, MSW, [place]  10/24/2017 4:34 PM

## 2017-10-24 NOTE — Telephone Encounter (Signed)
App made with the nurse on [employer]

## 2017-10-24 NOTE — Progress Notes (Signed)
[facility] [facility] [place] NAME: Tyler Henderson    MR#:  [id number]  DATE OF BIRTH:  01-29-1946  SUBJECTIVE:   Patient doing well this am Foley draining clear urine No back pain +constipated last BM 5 days ago  Denies saddle anesthesia, bowel or bladder incontinence no numbness or tingling down the lower extremities  REVIEW OF SYSTEMS:    Review of Systems  Constitutional: Negative for fever, chills weight loss HENT: Negative for ear pain, nosebleeds, congestion, facial swelling, rhinorrhea, neck pain, neck stiffness and ear discharge.   Respiratory: Negative for cough, shortness of breath, wheezing  Cardiovascular: Negative for chest pain, palpitations and leg swelling.  Gastrointestinal: Negative for heartburn, abdominal pain, vomiting, diarrhea  ++consitpation Genitourinary: Negative for dysuria, urgency, frequency, hematuria Musculoskeletal: Negative for back pain or joint pain Neurological: Negative for dizziness, seizures, syncope, focal weakness,  numbness and headaches.  Hematological: Does not bruise/bleed easily.  Psychiatric/Behavioral: Negative for hallucinations, confusion, dysphoric mood    Tolerating Diet: yes      DRUG ALLERGIES:  No Known Allergies  VITALS:  Blood pressure (!) 144/72, pulse (!) 103, temperature 98.5 F (36.9 C), temperature source Oral, resp. rate (!) 22, height 6\' 1"  (1.854 m), weight (!) 147.9 kg (326 lb), SpO2 98 %.  PHYSICAL EXAMINATION:  Constitutional: Appears well-developed and well-nourished. No distress. HENT: Normocephalic. Marland Kitchen Oropharynx is clear and moist.  Eyes: Conjunctivae and EOM are normal. PERRLA, no scleral icterus.  Neck: Normal ROM. Neck supple. No JVD. No tracheal deviation. CVS: RRR, S1/S2 +, no murmurs, no gallops, no carotid bruit.  Pulmonary: Effort and breath sounds normal, no stridor, rhonchi, wheezes, rales.  Abdominal: Soft. BS +,  no distension, tenderness, rebound or  guarding.  Musculoskeletal: Normal range of motion. No edema and no tenderness.  Neuro: Alert. CN 2-12 grossly intact. No focal deficits. Skin: Skin is warm and dry. No rash noted. Psychiatric: Normal mood and affect.      LABORATORY PANEL:   CBC Recent Labs  Lab 10/24/17 0448  WBC 8.3  HGB 8.3*  HCT 25.8*  PLT 266   ------------------------------------------------------------------------------------------------------------------  Chemistries  Recent Labs  Lab 10/24/17 0448  NA 134*  K 5.1  CL 105  CO2 18*  GLUCOSE 239*  BUN 78*  CREATININE 4.11*  CALCIUM 7.8*  AST 28  ALT 14*  ALKPHOS 230*  BILITOT 0.6   ------------------------------------------------------------------------------------------------------------------  Cardiac Enzymes No results for input(s): TROPONINI in the last 168 hours. ------------------------------------------------------------------------------------------------------------------  RADIOLOGY:  Dg Abdomen 1 View  Result Date: 10/23/2017 CLINICAL DATA:  Bladder carcinoma.  Pain and inability to urinate EXAM: ABDOMEN - 1 VIEW COMPARISON:  PET-CT September 21, 2017 FINDINGS: There is moderate stool throughout the colon. There is no bowel dilatation or air-fluid level to suggest bowel obstruction. No free air. There are probable phleboliths in the pelvis. Urinary bladder does not appear distended by radiography. IMPRESSION: No bowel obstruction or free air. Urinary bladder does not appear distended by radiography. Electronically Signed   By: Lowella Grip III M.D.   On: 10/23/2017 10:51   Mr Lumbar Spine Wo Contrast  Result Date: 10/24/2017 CLINICAL DATA:  Difficulty urinating for 3 days. History of metastatic melanoma, bladder cancer and chronic kidney disease. EXAM: MRI LUMBAR SPINE WITHOUT CONTRAST TECHNIQUE: Multiplanar, multisequence MR imaging of the lumbar spine was performed. No intravenous contrast was administered. COMPARISON:  PET CT  September 21, 2017 FINDINGS: SEGMENTATION: For the purposes of this report, the last well-formed  intervertebral disc is reported as L5-S1. ALIGNMENT: Maintained lumbar lordosis. No malalignment. VERTEBRAE:30 mm low T1, bright STIR signal mass RIGHT L4 vertebral body extending to the pedicle with mild acute fracture, less than 15% height loss superior endplate associated with Schmorl's node. Low T1, bright STIR signal RIGHT T12 facet consistent with metastasis. Ovoid subcentimeter low T1, bright STIR signal inferior to LEFT L2 pedicle, likely extra osseous cyst. Remaining vertebral bodies intact. Congenital canal narrowing on the basis of foreshortened pedicles. Moderate to severe L5-S1 disc height loss with mild chronic discogenic endplate changes. Mild disc desiccation L2-3 through L5-S1. CONUS MEDULLARIS AND CAUDA EQUINA: Conus medullaris terminates at L1-2 and demonstrates normal morphology and signal characteristics. Cauda equina is normal. PARASPINAL AND OTHER SOFT TISSUES: T2 bright cyst LEFT kidney. Mild bright interstitial STIR signal bilateral paraspinal muscles seen with low-grade strain. 9 mm round cystic lesion RIGHT paraspinal soft tissues fluid fluid level. At least 4.8 cm mass contiguous with RIGHT sacroiliac joint undermining the ileo psoas muscles. DISC LEVELS: L1-2, L2-3: No disc bulge, canal stenosis nor neural foraminal narrowing. Mild facet arthropathy. L3-4: RIGHT L4 osseous metastasis with a 9 x 7 mm tumoral extension to RIGHT lateral recess displacing the traversing RIGHT L4 nerve. Small broad-based disc bulge and mild facet arthropathy. Mild canal stenosis. Mild bilateral neural foraminal narrowing. L4-5: Small broad-based disc bulge, annular fissure. Mild facet arthropathy and ligamentum flavum redundancy. Moderate canal stenosis. Moderate RIGHT, mild LEFT neural foraminal narrowing. L5-S1: Moderate broad-based disc bulge. Mild facet arthropathy and ligamentum flavum redundancy. No canal  stenosis. Mild to moderate bilateral neural foraminal narrowing. IMPRESSION: 1. RIGHT L4 metastasis with mild pathologic fracture, tumor expansion into epidural space resulting in traversing RIGHT L4 nerve impingement. RIGHT T12 facet metastasis. 2. 2 subcentimeter cystic paraspinal muscle masses concerning for metastatic deposits. At least 4.8 cm RIGHT pelvic metastasis. 3. 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 Moderate canal stenosis L4-5, mild canal stenosis L3-4. 4. Neural foraminal narrowing L3-4 through L5-S1: Moderate on the RIGHT at L4-5. Electronically Signed   By: Elon Alas M.D.   On: 10/24/2017 01:31   US Renal  Result Date: 10/23/2017 CLINICAL DATA:  Acute renal injury, history of metastatic bladder carcinoma EXAM: RENAL / URINARY TRACT ULTRASOUND COMPLETE COMPARISON:  07/14/2017. FINDINGS: Right Kidney: Length: 11.7 cm. Echogenicity within normal limits. No mass or hydronephrosis visualized. Left Kidney: Length: 12.1 cm. Cysts are noted within the left kidney. The largest of these measures 9.1 cm in greatest dimension. These are stable when compared with prior CT examination. No hydronephrosis is noted. Bladder: Appears normal for degree of bladder distention. Note is made of known lesions within the right lobe of the liver. IMPRESSION: Left renal cystic change. Stable masses in the liver are noted. Electronically Signed   By: Inez Catalina M.D.   On: 10/23/2017 15:24     ASSESSMENT AND PLAN:   72 year old male with history of metastatic melanoma with liver, lung and bone involvement as well as history of bladder cancer presented to the ER with weakness and decreased urination.  1.  Acute kidney injury on chronic kidney disease stage IV in the setting of tobstructive uropathy  Creatinine slowly improving  2.  Obstructive uropathy: Patient was evaluated by urology. Continue Flomax Patient will need Foley catheter in for at least 1 week and  then may have voiding trial MRI did not show spinal cord compression  3.  Metastatic melanoma: Patient will have outpatient follow-up with Dr. Grayland Ormond upon  discharge. Consultation with neurosurgery and radiation oncologist requested.   4.  Essential hypertension: Continue Coreg   5  Diabetes: Continue glipizide with ADA diet and sliding scale  6.  Hyperlipidemia on statin and omega-3  7.  Constipation: I added stool softeners today   Management plans discussed with the patient and wife and they are in agreement.  CODE STATUS:  full  TOTAL TIME TAKING CARE OF THIS PATIENT: 30 minutes.   D/w Dr Tasia Catchings  POSSIBLE D/C 2-4 days, DEPENDING ON CLINICAL CONDITION.   Lakeita Panther M.D on 10/24/2017 at 11:15 AM  Between 7am to 6pm - Pager - [phone] After 6pm go to [email or web address] - password [email or web address] [facility]  Office  [phone]  CC: Primary care physician; Sofie Hartigan, MD  Note: This dictation was prepared with Dragon dictation along with smaller phrase technology. Any transcriptional errors that result from this process are unintentional.

## 2017-10-24 NOTE — Consult Note (Signed)
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  Foley placed in ER with 1L return.  MRI L-spine obtained overnight showing progression of known L4 metastasis into the right lateral recess.  Patient reports that he noted onset of RLE pain, in the thigh, after undergoing PET CT on 09/21/17.  The pain was aching and primarily located in the lateral thigh; patient describes intermittent symptoms that radiated to lateral leg however he makes a point of saying the pain was primarily in the thigh.  He noted increasing difficulty with walking due to the pain in the right thigh and states that his knee would occasionally give out due to the pain.  His wife has noted a gradual decline in her husband's ability to ambulate.  He had been independent, then transitioned to using a walker, and yesterday was unable to get himself up.  Currently the patient reports he has no pain in the RLE; he denies numbness in the RLE; he denies perineal/saddle anesthesia; he denies back pain.  Past Medical History  Patient Active Problem List   Diagnosis Date Noted  . 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 Acute kidney injury ([place]) 10/23/2017  . Urinary retention   . Malignant melanoma, metastatic ([place]) 10/15/2017  . Goals of care, counseling/discussion 10/15/2017  . Mass of upper lobe of left lung 10/08/2017  . Liver masses 09/17/2017  . Microscopic hematuria 03/07/2016  . Simple renal cyst 03/07/2016  . Nephrolithiasis 03/07/2016  . Chronic renal failure, stage 3 (moderate) ([place]) 03/07/2016    Past Medical History:   Diagnosis Date  . Anxiety   . Arthritis   . Bladder cancer ([place])   . Chronic kidney disease   . Depression   . Diabetes ([place])   . Dysrhythmia    atrial fib  . GERD (gastroesophageal reflux disease)   . Hypertension   . Restless leg syndrome   . Shortness of breath dyspnea    with exertion  . Skin cancer   . Sleep apnea    CPAP "once in awhile"  . 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 APPENDECTOMY    . JOINT REPLACEMENT Left    Total Knee Replacement  . KNEE ARTHROSCOPY Left   . PORTA CATH INSERTION N/A 10/19/2017   Procedure: PORTA CATH INSERTION;  Surgeon: Algernon Huxley, MD;  Location: [facility];  Service: Cardiovascular;  Laterality: N/A;  . REPLACEMENT TOTAL KNEE Left 2012   Dr. Little Ishikawa, Jr. [facility]  . TRANSURETHRAL RESECTION OF BLADDER TUMOR N/A 03/14/2016   Procedure: TRANSURETHRAL RESECTION OF BLADDER TUMOR (TURBT);  Surgeon: Hollice Espy, MD;  Location: [facility];  Service: Urology;  Laterality: N/A;     No Known Allergies   Social History   Tobacco Use  . Smoking status: Former Smoker    Packs/day: 1.00    Years: 20.00    Pack years: 20.00    Types: Cigarettes    Last attempt to quit: 1979    Years since quitting: 40.2  . Smokeless tobacco: Never Used  Substance  Use Topics  . Alcohol use: Yes    Alcohol/week: 1.2 oz    Types: 2 Cans of beer per week    Comment: occassional     Family History  Problem Relation Age of Onset  . Cancer Mother        Breast  . Heart failure Father   . Prostate cancer Neg Hx   . Bladder Cancer Neg Hx      ROS: As per hpi; all others negative   EXAMINATION  VITALS: BP 139/71 (BP Location: Left Arm)   Pulse 94   Temp 98.1 F (36.7 C) (Oral)   Resp 20   Ht 6\' 1"  (1.854 m)   Wt (!) 147.9 kg (326 lb)   SpO2 94%   BMI 43.01 kg/m    Vitals Current Average / Min / Max     Temp    98.1 F (36.7 C)    Temp  Min: 97.5 F (36.4 C)  Max: 98.9 F  (37.2 C)     BP     139/71     BP  Min: 123/61  Max: 144/72     HR    94    Pulse  Avg: 100.3  Min: 94  Max: 104     RR    20    Resp  Avg: 19  Min: 17  Max: 22     Sats    94 %    SpO2  Min: 92 %  Max: 98 %     Weight    (!) 147.9 kg (326 lb)    Admit: (!) 147.9 kg (326 lb)     Intake/Output Summary (Last 24 hours) at 10/24/2017 1407 Last data filed at 10/24/2017 1039 Gross per 24 hour  Intake 493.75 ml  Output 5200 ml  Net -4706.25 ml     GENERAL:  Awake; laying in hospital bed; interactive   MUSCULOSKELETAL:  Nontender lumbar spine Normal bulk and tone of lower extremities.   No abnormal movements or fasiculations.   HIGHER INTEGRATIVE FUNCTIONS:  GCS: 15 Oriented to person, place, and time Recent and remote memory normal Attention and concentration normal   STRENGTH:  Full and symmetric lower extremity strength in hip flexors quadriceps ankle dorsi and plantar flexion.   REFLEXES: Normal 2+ left knee; 1+ right knee. No pathological reflexes.   SENSATION: Normal light touch throughout lower extremities  No clonus Toes downgoing Able to sense foley catheter tug in the bladder   Gait not assessed.  LABORATORY: Lab Results  Component Value Date   NA 134 (L) 10/24/2017   K 5.1 10/24/2017   CL 105 10/24/2017   WBC 8.3 10/24/2017   HGB 8.3 (L) 10/24/2017   HCT 25.8 (L) 10/24/2017   INR 1.12 10/06/2017     IMAGING: MRI L-spine shows metastatic lesion in the L4 body (seen previously on PET CT 08/2017 and Abd CT 06/2017) with epidural extension to the right lateral recess displacing the right L4 nerve root.  Superior endplate discontinuity is apparent to my review of CT from December 2018.  RADIOLOGY IMPRESSION: 1. RIGHT L4 metastasis with mild pathologic fracture, tumor expansion into epidural space resulting in traversing RIGHT L4 nerve impingement. RIGHT T12 facet metastasis. 2. 2 subcentimeter cystic paraspinal muscle masses concerning for metastatic  deposits. At least 4.8 cm RIGHT pelvic metastasis. 3. 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 Moderate canal stenosis L4-5, mild canal stenosis L3-4. 4. Neural foraminal narrowing L3-4  through L5-S1: Moderate on the RIGHT at L4-5.  ASSESSMENT AND PLAN:  72 yo man with metastatic melanoma involving lungs, pelvis, liver, and spine.  No findings on MRI to explain urinary retention.  L4 metastasis with epidural extension to the right lateral recess can cause right L4 radiculopathy however patient's description of RLE pain as well as current exam findings do not correlate with a right L4 radiculopathy.  With regard to muscular strength, the right L4 nerve root contributes to dorsiflexion and should not cause hip girdle/thigh/knee weakness.  Patient's progressive ambulatory difficulty may be due to weakness/deconditioning particularly given his current BMI.  The superior endplate defect of L4 was evident on CT from December 2018 and thus is not acute; patient denies back pain to me.  No current indications for surgical intervention.  Recommend:  (1) Recommend proceed with spinal XRT as previously planned by [facility] (2) Recommend PT/OT for conditioning, stamina, gait training  [ordered by me] (3) Recommend standing/weight bearing lumbar radiographs when able  [ordered by me] (4) Recommend LSO when up greater than 45degrees  [ordered by me]  I spoke at length with the patient's wife.  We discussed the patient's current condition, physical examination findings, and MRI findings.  We discussed that currently there are no indications for surgical intervention.  We discussed the indications for surgical intervention are to preserve function (i.e. ambulation) and to stabilize the spinal column if it becomes unstable.  We discussed that his spinal metastases could progress leading to spinal column disruption, instability, and/or spinal cord/neural element compression. The  patient and his wife wish to avoid surgical intervention if possible.  We discussed that given his BMI and other comorbidities he is not an ideal candidate for surgery with increased risk for complications and that if his cancer does progress, very difficult decisions about whether to intervene surgically will need to be made in the context of his overall prognosis.  I answered the patient's and his wife's questions in lay terms and detail.  I discussed with the wife that one of my partners, Dr. Izora Ribas, would be on site Wednesday and Thursday this week.  ACTIVE PROBLEMS: Active Problems:   Malignant melanoma, metastatic ([place])   Acute kidney injury [facility])   Urinary retention   Spine metastasis ([place])   @[email or web address] @   This note was dictated using voice recognition software.  Please contact me if there are any questions regarding its content.  Electronically signed by: @[email or web address] @ 10/24/2017 2:07 PM

## 2017-10-24 NOTE — Progress Notes (Signed)
This RN spoke with Dr. Duane Boston regarding MRI results for this patient. Dr. Margaretmary Eddy spoke to this RN earlier this shift inquiring why STAT MRI ordered around 4:00 pm on 4/1 had not yet been completed. Dr. Margaretmary Eddy wanted prime doc to look at MRI results when completed and see if Dr. Tasia Catchings (oncologist) recommendations needed to be carried out. Per Dr. Duane Boston pt would benefit from decadron 10mg  X1 dose and neurosurgery consult. She recommends attending physician see pt in am to see if pt would benefit from decadron 4mg  scheduled. Awaiting orders to be verified.

## 2017-10-24 NOTE — Telephone Encounter (Signed)
-----   Message from Hollice Espy, MD sent at 10/23/2017  4:43 PM EDT ----- Regarding: VT in 1 week with nurse This is a current inpatient in urinary retention.  He needs a voiding trial next week as an outpatient, nurse visit.   Hollice Espy, MD

## 2017-10-24 NOTE — Progress Notes (Signed)
Hematology/Oncology Progress Note [facility] [phone] [phone]  Patient Care Team: Sofie Hartigan, MD as PCP - General (Family Medicine)   Name of the patient: Tyler Henderson  [id number]  01/19/1946  Date of visit: 10/24/17   INTERVAL HISTORY- s/p MRI lumbar spine. No acute cord compression.  Right L4 metastasis with pathologic fracture, tumor expansion into the epidural space resulting transverse right L4 nerve impingement.  Right T12 facet metastases.  There is also 2 subcentimeter cystic paraspinal muscle masses concerning for metastatic deposits.  4.8 cm right pelvic metastasis. Patient reports feeling same.  He has not had a bowel movement yet.  He is on Colace 100 mg, lactulose 30 g twice daily, also received a suppository. Appetite is fair.  Able to eat a bag of potato chips.  Foley catheter draining clear urine  Review of systems- Review of Systems  Constitutional: Positive for malaise/fatigue. Negative for chills and fever.  HENT: Negative for nosebleeds.   Eyes: Negative for double vision.  Respiratory: Negative for cough.   Cardiovascular: Negative for chest pain, orthopnea and claudication.  Gastrointestinal: Negative for abdominal pain, nausea and vomiting.  Genitourinary: Negative for dysuria.  Musculoskeletal: Negative for myalgias and neck pain.  Neurological: Negative for dizziness.  Endo/Heme/Allergies: Does not bruise/bleed easily.  Psychiatric/Behavioral: Negative for depression. The patient is not nervous/anxious.     No Known Allergies  Patient Active Problem List   Diagnosis Date Noted  . 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 Acute kidney injury ([place]) 10/23/2017  . Urinary retention   . Malignant melanoma, metastatic ([place]) 10/15/2017  . Goals of care, counseling/discussion 10/15/2017  . Mass of upper lobe of left lung 10/08/2017  . Liver masses 09/17/2017  . Microscopic hematuria 03/07/2016  . Simple  renal cyst 03/07/2016  . Nephrolithiasis 03/07/2016  . Chronic renal failure, stage 3 (moderate) ([place]) 03/07/2016     Past Medical History:  Diagnosis Date  . Anxiety   . Arthritis   . Bladder cancer ([place])   . Chronic kidney disease   . Depression   . Diabetes ([place])   . Dysrhythmia    atrial fib  . GERD (gastroesophageal reflux disease)   . Hypertension   . Restless leg syndrome   . Shortness of breath dyspnea    with exertion  . Skin cancer   . Sleep apnea    CPAP "once in awhile"  . 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 APPENDECTOMY    . JOINT REPLACEMENT Left    Total Knee Replacement  . KNEE ARTHROSCOPY Left   . PORTA CATH INSERTION N/A 10/19/2017   Procedure: PORTA CATH INSERTION;  Surgeon: Algernon Huxley, MD;  Location: [facility];  Service: Cardiovascular;  Laterality: N/A;  . REPLACEMENT TOTAL KNEE Left 2012   Dr. Little Ishikawa, Jr. [facility]  . TRANSURETHRAL RESECTION OF BLADDER TUMOR N/A 03/14/2016   Procedure: TRANSURETHRAL RESECTION OF BLADDER TUMOR (TURBT);  Surgeon: Hollice Espy, MD;  Location: [facility];  Service: Urology;  Laterality: N/A;    Social History   Socioeconomic History  . Marital status: Married    Spouse name: Not on file  . Number of children: Not on file  . Years of education: Not on file  . Highest education level: Not on file  Occupational History  . Not on file  Social Needs  . Financial resource strain: Not on file  . Food insecurity:  Worry: Not on file    Inability: Not on file  . Transportation needs:    Medical: Not on file    Non-medical: Not on file  Tobacco Use  . Smoking status: Former Smoker    Packs/day: 1.00    Years: 20.00    Pack years: 20.00    Types: Cigarettes    Last attempt to quit: 1979    Years since quitting: 40.2  . Smokeless tobacco: Never Used  Substance and Sexual Activity  . Alcohol use: Yes    Alcohol/week: 1.2 oz    Types: 2 Cans  of beer per week    Comment: occassional  . Drug use: No  . Sexual activity: Not on file  Lifestyle  . Physical activity:    Days per week: Not on file    Minutes per session: Not on file  . Stress: Not on file  Relationships  . Social connections:    Talks on phone: Not on file    Gets together: Not on file    Attends religious service: Not on file    Active member of club or organization: Not on file    Attends meetings of clubs or organizations: Not on file    Relationship status: Not on file  . Intimate partner violence:    Fear of current or ex partner: Not on file    Emotionally abused: Not on file    Physically abused: Not on file    Forced sexual activity: Not on file  Other Topics Concern  . Not on file  Social History Narrative  . Not on file     Family History  Problem Relation Age of Onset  . Cancer Mother        Breast  . Heart failure Father   . Prostate cancer Neg Hx   . Bladder Cancer Neg Hx      Current Facility-Administered Medications:  .  acetaminophen (TYLENOL) tablet 325 mg, 325 mg, Oral, Q6H PRN, Gouru, Aruna, MD .  ALPRAZolam Duanne Moron) tablet 0.5 mg, 0.5 mg, Oral, QHS PRN, Gouru, Aruna, MD .  aspirin EC tablet 81 mg, 81 mg, Oral, QPM, Gouru, Aruna, MD, 81 mg at 10/23/17 2219 .  atorvastatin (LIPITOR) tablet 40 mg, 40 mg, Oral, q1800, Gouru, Aruna, MD, 40 mg at 10/23/17 2218 .  bisacodyl (DULCOLAX) suppository 10 mg, 10 mg, Rectal, Daily, Mody, Sital, MD, 10 mg at 10/24/17 1236 .  carvedilol (COREG) tablet 25 mg, 25 mg, Oral, BID WC, Gouru, Aruna, MD, 25 mg at 10/24/17 0819 .  docusate sodium (COLACE) capsule 100 mg, 100 mg, Oral, BID, Mody, Sital, MD .  enoxaparin (LOVENOX) injection 30 mg, 30 mg, Subcutaneous, Q24H, Lenis Noon, RPH .  ferrous sulfate tablet 325 mg, 325 mg, Oral, Q breakfast, Gouru, Aruna, MD, 325 mg at 10/24/17 0819 .  FLUoxetine (PROZAC) capsule 10 mg, 10 mg, Oral, Daily, Gouru, Aruna, MD, 10 mg at 10/24/17 0819 .   gabapentin (NEURONTIN) capsule 300 mg, 300 mg, Oral, QPM, Gouru, Aruna, MD, 300 mg at 10/23/17 2218 .  glipiZIDE (GLUCOTROL) tablet 10 mg, 10 mg, Oral, QAC breakfast, Gouru, Aruna, MD, 10 mg at 10/24/17 0819 .  insulin aspart (novoLOG) injection 0-5 Units, 0-5 Units, Subcutaneous, QHS, Gouru, Aruna, MD, 2 Units at 10/23/17 2219 .  insulin aspart (novoLOG) injection 0-9 Units, 0-9 Units, Subcutaneous, TID WC, Gouru, Aruna, MD, 7 Units at 10/24/17 1236 .  lactulose (CHRONULAC) 10 GM/15ML solution 30 g, 30 g, Oral, BID,  Bettey Costa, MD, 30 g at 10/24/17 1235 .  lidocaine-prilocaine (EMLA) cream, , Topical, Once, Kolluru, Sarath, MD .  megestrol (MEGACE) tablet 40 mg, 40 mg, Oral, Daily, Gouru, Aruna, MD, 40 mg at 10/24/17 0819 .  omega-3 acid ethyl esters (LOVAZA) capsule 1 g, 1 g, Oral, Daily, Gouru, Aruna, MD, 1 g at 10/24/17 0819 .  ondansetron (ZOFRAN) tablet 4 mg, 4 mg, Oral, Q6H PRN **OR** ondansetron (ZOFRAN) injection 4 mg, 4 mg, Intravenous, Q6H PRN, Gouru, Aruna, MD .  oxyCODONE (Oxy IR/ROXICODONE) immediate release tablet 10 mg, 10 mg, Oral, BID PRN, Gouru, Aruna, MD .  pantoprazole (PROTONIX) EC tablet 40 mg, 40 mg, Oral, Daily, Gouru, Aruna, MD, 40 mg at 10/24/17 0819 .  patiromer Daryll Drown) packet 8.4 g, 8.4 g, Oral, BID, Gouru, Aruna, MD, 8.4 g at 10/24/17 1246 .  senna (SENOKOT) tablet 8.6 mg, 1 tablet, Oral, Daily, Mody, Sital, MD .  senna-docusate (Senokot-S) tablet 1 tablet, 1 tablet, Oral, BID, Mody, Sital, MD, 1 tablet at 10/24/17 1236 .  tamsulosin (FLOMAX) capsule 0.4 mg, 0.4 mg, Oral, QPC supper, Kolluru, Sarath, MD, 0.4 mg at 10/23/17 2219   Physical exam:  Vitals:   10/23/17 1943 10/24/17 0332 10/24/17 0820 10/24/17 1317  BP: (!) 127/59 123/61 (!) 144/72 139/71  Pulse: (!) 104 100 (!) 103 94  Resp: 17 17 (!) 22 20  Temp: 98.9 F (37.2 C) (!) 97.5 F (36.4 C) 98.5 F (36.9 C) 98.1 F (36.7 C)  TempSrc: Oral Oral Oral Oral  SpO2: 92% 94% 98% 94%  Weight:        Height:       GENERAL:Alert, no distress and comfortable.  EYES: no pallor or icterus OROPHARYNX: no thrush or ulceration;   NECK: supple, no masses felt LUNGS: clear to auscultation and  No wheeze or crackles HEART/CVS: regular rate & rhythm and no murmurs;  ABDOMEN: abdomen soft, non-tender and normal bowel sounds Musculoskeletal:no cyanosis of digits and no clubbing  PSYCH: alert & oriented x 3  NEURO: Cranial nerves II-XII intact, bilateral lower extremities weakness, 3/5. Gait not checked.  SKIN:  no rashes or significant lesions       CMP Latest Ref Rng & Units 10/24/2017  Glucose 65 - 99 mg/dL 239(H)  BUN 6 - 20 mg/dL 78(H)  Creatinine 0.61 - 1.24 mg/dL 4.11(H)  Sodium 135 - 145 mmol/L 134(L)  Potassium 3.5 - 5.1 mmol/L 5.1  Chloride 101 - 111 mmol/L 105  CO2 22 - 32 mmol/L 18(L)  Calcium 8.9 - 10.3 mg/dL 7.8(L)  Total Protein 6.5 - 8.1 g/dL 6.1(L)  Total Bilirubin 0.3 - 1.2 mg/dL 0.6  Alkaline Phos 38 - 126 U/L 230(H)  AST 15 - 41 U/L 28  ALT 17 - 63 U/L 14(L)   CBC Latest Ref Rng & Units 10/24/2017  WBC 3.8 - 10.6 K/uL 8.3  Hemoglobin 13.0 - 18.0 g/dL 8.3(L)  Hematocrit 40.0 - 52.0 % 25.8(L)  Platelets 150 - 440 K/uL 266   Dg Abdomen 1 View  Result Date: 10/23/2017 CLINICAL DATA:  Bladder carcinoma.  Pain and inability to urinate EXAM: ABDOMEN - 1 VIEW COMPARISON:  PET-CT September 21, 2017 FINDINGS: There is moderate stool throughout the colon. There is no bowel dilatation or air-fluid level to suggest bowel obstruction. No free air. There are probable phleboliths in the pelvis. Urinary bladder does not appear distended by radiography. IMPRESSION: No bowel obstruction or free air. Urinary bladder does not appear distended by radiography. Electronically Signed  By: Lowella Grip III M.D.   On: 10/23/2017 10:51   Mr Jeri Cos XH Contrast  Result Date: 10/11/2017 CLINICAL DATA:  Bladder cancer with metastatic disease to the liver, skeleton, and lungs. EXAM: MRI  HEAD WITHOUT AND WITH CONTRAST TECHNIQUE: Multiplanar, multiecho pulse sequences of the brain and surrounding structures were obtained without and with intravenous contrast. CONTRAST:  59mL MULTIHANCE GADOBENATE DIMEGLUMINE 529 MG/ML IV SOLN COMPARISON:  PET scan 09/21/2017. FINDINGS: Brain: No focal enhancing lesions are present to suggest metastatic disease the brain or meninges. Minimal white matter changes are within normal limits for age. No acute infarct, hemorrhage, or mass lesion is present. Ventricles are of normal size. The internal auditory canals are within normal limits bilaterally. The brainstem and cerebellum are normal. No significant extra-axial fluid collection is present. Vascular: Flow is present in the major intracranial arteries. Globes and orbits are at that Skull and upper cervical spine: The calvarium is intact. Focal lesions or enhancement are evident. The skull base is within normal limits. The craniocervical junction is normal. Sinuses/Orbits: Mild mucosal thickening is present along the floor of the maxillary sinuses bilaterally. No fluid levels are present. The paranasal sinuses and mastoid air cells are otherwise clear. Globes and orbits are within normal limits. IMPRESSION: 1. No evidence for metastatic disease to the brain or meninges. 2. Normal MRI appearance of the brain for age. 3. Minimal maxillary sinus disease. Electronically Signed   By: San Morelle M.D.   On: 10/11/2017 11:54   Mr Lumbar Spine Wo Contrast  Result Date: 10/24/2017 CLINICAL DATA:  Difficulty urinating for 3 days. History of metastatic melanoma, bladder cancer and chronic kidney disease. EXAM: MRI LUMBAR SPINE WITHOUT CONTRAST TECHNIQUE: Multiplanar, multisequence MR imaging of the lumbar spine was performed. No intravenous contrast was administered. COMPARISON:  PET CT September 21, 2017 FINDINGS: SEGMENTATION: For the purposes of this report, the last well-formed intervertebral disc is reported as  L5-S1. ALIGNMENT: Maintained lumbar lordosis. No malalignment. VERTEBRAE:30 mm low T1, bright STIR signal mass RIGHT L4 vertebral body extending to the pedicle with mild acute fracture, less than 15% height loss superior endplate associated with Schmorl's node. Low T1, bright STIR signal RIGHT T12 facet consistent with metastasis. Ovoid subcentimeter low T1, bright STIR signal inferior to LEFT L2 pedicle, likely extra osseous cyst. Remaining vertebral bodies intact. Congenital canal narrowing on the basis of foreshortened pedicles. Moderate to severe L5-S1 disc height loss with mild chronic discogenic endplate changes. Mild disc desiccation L2-3 through L5-S1. CONUS MEDULLARIS AND CAUDA EQUINA: Conus medullaris terminates at L1-2 and demonstrates normal morphology and signal characteristics. Cauda equina is normal. PARASPINAL AND OTHER SOFT TISSUES: T2 bright cyst LEFT kidney. Mild bright interstitial STIR signal bilateral paraspinal muscles seen with low-grade strain. 9 mm round cystic lesion RIGHT paraspinal soft tissues fluid fluid level. At least 4.8 cm mass contiguous with RIGHT sacroiliac joint undermining the ileo psoas muscles. DISC LEVELS: L1-2, L2-3: No disc bulge, canal stenosis nor neural foraminal narrowing. Mild facet arthropathy. L3-4: RIGHT L4 osseous metastasis with a 9 x 7 mm tumoral extension to RIGHT lateral recess displacing the traversing RIGHT L4 nerve. Small broad-based disc bulge and mild facet arthropathy. Mild canal stenosis. Mild bilateral neural foraminal narrowing. L4-5: Small broad-based disc bulge, annular fissure. Mild facet arthropathy and ligamentum flavum redundancy. Moderate canal stenosis. Moderate RIGHT, mild LEFT neural foraminal narrowing. L5-S1: Moderate broad-based disc bulge. Mild facet arthropathy and ligamentum flavum redundancy. No canal stenosis. Mild to moderate bilateral neural foraminal narrowing.  IMPRESSION: 1. RIGHT L4 metastasis with mild pathologic fracture,  tumor expansion into epidural space resulting in traversing RIGHT L4 nerve impingement. RIGHT T12 facet metastasis. 2. 2 subcentimeter cystic paraspinal muscle masses concerning for metastatic deposits. At least 4.8 cm RIGHT pelvic metastasis. 3. 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 Moderate canal stenosis L4-5, mild canal stenosis L3-4. 4. Neural foraminal narrowing L3-4 through L5-S1: Moderate on the RIGHT at L4-5. Electronically Signed   By: Elon Alas M.D.   On: 10/24/2017 01:31   US Renal  Result Date: 10/23/2017 CLINICAL DATA:  Acute renal injury, history of metastatic bladder carcinoma EXAM: RENAL / URINARY TRACT ULTRASOUND COMPLETE COMPARISON:  07/14/2017. FINDINGS: Right Kidney: Length: 11.7 cm. Echogenicity within normal limits. No mass or hydronephrosis visualized. Left Kidney: Length: 12.1 cm. Cysts are noted within the left kidney. The largest of these measures 9.1 cm in greatest dimension. These are stable when compared with prior CT examination. No hydronephrosis is noted. Bladder: Appears normal for degree of bladder distention. Note is made of known lesions within the right lobe of the liver. IMPRESSION: Left renal cystic change. Stable masses in the liver are noted. Electronically Signed   By: Inez Catalina M.D.   On: 10/23/2017 15:24   US Biopsy (liver)  Result Date: 10/06/2017 INDICATION: Concern for metastatic lung cancer, now with multiple hypermetabolic liver lesions. Please perform ultrasound-guided liver lesion biopsy for tissue diagnostic purposes. EXAM: ULTRASOUND GUIDED LIVER LESION BIOPSY COMPARISON:  PET-CT - 09/21/2017 MEDICATIONS: None ANESTHESIA/SEDATION: Fentanyl 50 mcg IV; Versed 2 mg IV Total Moderate Sedation time:  14 Minutes. The patient's level of consciousness and vital signs were monitored continuously by radiology nursing throughout the procedure under my direct supervision. COMPLICATIONS: None immediate. PROCEDURE:  Informed written consent was obtained from the patient after a discussion of the risks, benefits and alternatives to treatment. The patient understands and consents the procedure. A timeout was performed prior to the initiation of the procedure. Ultrasound scanning was performed of the right upper abdominal quadrant demonstrates multiple mixed echogenic lesions and masses within both the right and left lobes of the liver. An approximately 3.2 x 2.5 cm hypoechoic lesion/mass within the medial segment of left lobe liver correlating with the hypoattenuating lesion seen on preceding PET-CT image 153, series 4, was targeted for biopsy given lesion location and sonographic window. The procedure was planned. The right upper abdominal quadrant was prepped and draped in the usual sterile fashion. The overlying soft tissues were anesthetized with 1% lidocaine with epinephrine. A 17 gauge, 6.8 cm co-axial needle was advanced into a peripheral aspect of the lesion. This was followed by 5 core biopsies with an 18 gauge core device under direct ultrasound guidance. The coaxial needle tract was embolized with a small amount of Gel-Foam slurry and superficial hemostasis was obtained with manual compression. Post procedural scanning was negative for definitive area of hemorrhage or additional complication. A dressing was placed. The patient tolerated the procedure well without immediate post procedural complication. IMPRESSION: Technically successful ultrasound guided core needle biopsy of indeterminate lesion/mass within the medial segment of the left lobe of the liver. Electronically Signed   By: [name] Mariscal M.D.   On: 10/06/2017 12:02    Assessment and plan- Patient is a 72 y.o. male with known history of metastatic melanoma with liver, lung, bone involvement presented with urinary retention, acute kidney injury on chronic kidney disease, and generalized weakness.  #Urinary retention, AK I on CKD:   Continue Foley  catheter  with voiding trial.  Urology and nephrology following.  No acute spinal cord compression.    Agree with consult radiation oncologist for evaluation of inpatient radiation. Spoke with Dr.Chrystal and he will see patient.  Recommend physical therapy evaluation  plan was discussed with patient, his wife and daughter.   #Metastatic melanoma: He just received 1 dose of and ipilimumab.  Outpatient follow-up with Dr. Grayland Ormond. Plan discussed with Dr.Mody.   Earlie Server, MD, PhD Hematology Oncology [facility] Pager- [phone] 10/24/2017

## 2017-10-24 NOTE — Progress Notes (Signed)
Pharmacist-Provider Communication:  Order for enoxaparin changed to 30 mg subcutaneously daily per protocol for CrCl < 30 mL/min.  Lenis Noon, PharmD 10/24/17 7:36 AM

## 2017-10-24 NOTE — Progress Notes (Signed)
OT Cancellation Note  Patient Details Name: Tyler Henderson MRN: [id number] DOB: 1945/08/07   Cancelled Treatment:    Reason Eval/Treat Not Completed: Other (comment). Order received, chart reviewed. Met pt in the room with his spouse waiting for nursing staff to assist with toileting. LSO brace has not yet been delivered. Will hold OT evaluation until LSO has been delivered and fitted and re-attempt next date as medically appropriate.   Jeni Salles, MPH, MS, OTR/L ascom [phone] 10/24/17, 5:03 PM

## 2017-10-24 NOTE — Progress Notes (Signed)
   Patient was scheduled Monday for simulation for to his lumbar spine and SI joints although was admitted for urinary retention. I've spoke with neurosurgeon have reviewed MRI scan. We will simulate the patient has an inpatient tomorrow morning.

## 2017-10-25 ENCOUNTER — Ambulatory Visit
Admit: 2017-10-25 | Discharge: 2017-10-25 | Disposition: A | Discharge status: Home / Self Care | Payer: Medicare Other | Source: Ambulatory Visit | Attending: Radiation Oncology | Admitting: Radiation Oncology

## 2017-10-25 LAB — CBC
HEMATOCRIT: 25.1 % — AB (ref 40.0–52.0)
Hemoglobin: 7.8 g/dL — ABNORMAL LOW (ref 13.0–18.0)
MCH: 25.9 pg — ABNORMAL LOW (ref 26.0–34.0)
MCHC: 31 g/dL — ABNORMAL LOW (ref 32.0–36.0)
MCV: 83.4 fL (ref 80.0–100.0)
Platelets: 315 10*3/uL (ref 150–440)
RBC: 3.01 MIL/uL — ABNORMAL LOW (ref 4.40–5.90)
RDW: 13.9 % (ref 11.5–14.5)
WBC: 13.7 10*3/uL — AB (ref 3.8–10.6)

## 2017-10-25 LAB — BASIC METABOLIC PANEL
ANION GAP: 8 (ref 5–15)
BUN: 84 mg/dL — ABNORMAL HIGH (ref 6–20)
CALCIUM: 8.1 mg/dL — AB (ref 8.9–10.3)
CO2: 20 mmol/L — AB (ref 22–32)
Chloride: 105 mmol/L (ref 101–111)
Creatinine, Ser: 3.55 mg/dL — ABNORMAL HIGH (ref 0.61–1.24)
GFR, EST AFRICAN AMERICAN: 18 mL/min — AB (ref >60)
GFR, EST NON AFRICAN AMERICAN: 16 mL/min — AB (ref >60)
Glucose, Bld: 315 mg/dL — ABNORMAL HIGH (ref 65–99)
Potassium: 4.9 mmol/L (ref 3.5–5.1)
Sodium: 133 mmol/L — ABNORMAL LOW (ref 135–145)

## 2017-10-25 LAB — GLUCOSE, CAPILLARY
GLUCOSE-CAPILLARY: 173 mg/dL — AB (ref 65–99)
Glucose-Capillary: 292 mg/dL — ABNORMAL HIGH (ref 65–99)
Glucose-Capillary: 308 mg/dL — ABNORMAL HIGH (ref 65–99)
Glucose-Capillary: 229 mg/dL — ABNORMAL HIGH (ref 65–99)
Glucose-Capillary: 255 mg/dL — ABNORMAL HIGH (ref 65–99)

## 2017-10-25 LAB — SURGICAL PATHOLOGY

## 2017-10-25 MED ORDER — INSULIN DETEMIR 100 UNIT/ML ~~LOC~~ SOLN
10.0000 [IU] | Freq: Two times a day (BID) | SUBCUTANEOUS | Status: DC
  Administered 2017-10-25 – 2017-10-26 (×3): 10 [IU] via SUBCUTANEOUS
  Filled 2017-10-25 (×4): qty 0.1

## 2017-10-25 MED ORDER — NEPRO/CARBSTEADY PO LIQD
237.0000 mL | ORAL | Status: DC
  Administered 2017-10-25: 237 mL via ORAL

## 2017-10-25 NOTE — Evaluation (Addendum)
Occupational Therapy Evaluation Patient Details Name: Tyler Henderson MRN: [id number] DOB: Sep 01, 1945 Today's Date: 10/25/2017    History of Present Illness Pt. is a 72 y.o. male who was admitted to [facility] with urinary retention, AKI, Metastatic Melanoma (To liver, lung, and bone. Pt. has an L4 mild pathological fracture, Tumor with expansion into the epidural space resulting in traverse Right L4 nerve impingement, L4 Radiculopathy. Pt. PMHx includes: Depression, Anxiety, DM Type 2, HTN, GERD, CKD.   Clinical Impression   Pt. presents with weakness, limited activity tolerance, and limited functional mobility which limits the ability to complete basic ADL and IADL functioning.Pt. Resides at home with his wife. Pt. was independent with ADL tasks. Pt. wife assists with meal preparation, and medication management. The LSO which was ordered has arrived. Pt. Has to wear the LSO brace when up, and equal to, or above 45 degrees. Pt. education was provided about A/E at the [facility]. Pt. reports having a history of his RLE buckling, or "giving way" when he starts walking. Pt. could benefit from OT services for ADL training, A/E training, and pt. education about Energy conservation, and work simplification techniques, home modification, and DME. Pt. would benefit from SNF level of care upon discharge, with follow-up OT services.    Follow Up Recommendations  SNF    Equipment Recommendations       Recommendations for Other Services       Precautions / Restrictions Precautions: Chemotherapy Precautions, LSO brace on when up, and when equal to or above 45 degrees. Precautions: None Restrictions Weight Bearing Restrictions: No      Mobility Bed Mobility Overal bed mobility: Needs Assistance Bed Mobility: Supine to Sit     Supine to sit: Min guard        Transfers    deferred                  Balance                                           ADL either performed or  assessed with clinical judgement   ADL Overall ADL's : Needs assistance/impaired Eating/Feeding: Set up;Independent;Sitting   Grooming: Set up;Independent;Sitting   Upper Body Bathing: Set up;Moderate assistance   Lower Body Bathing: Set up;Moderate assistance   Upper Body Dressing : Set up;Minimal assistance   Lower Body Dressing: Set up;Maximal assistance                 General ADL Comments: Pt. education was provided about A/E use for LE ADLs.      Vision         Perception     Praxis      Pertinent Vitals/Pain Pain Assessment: 0-10 Pain Score: 0-No pain     Hand Dominance Right   Extremity/Trunk Assessment Upper Extremity Assessment Upper Extremity Assessment: Generalized weakness           Communication Communication Communication: No difficulties   Cognition Arousal/Alertness: Awake/alert   Overall Cognitive Status: Within Functional Limits for tasks assessed                                     General Comments       Exercises     Shoulder Instructions      Home Living Family/patient expects  to be discharged to:: Private residence Living Arrangements: Spouse/significant other;Children Available Help at Discharge: Family Type of Home: House Home Access: Stairs to enter Technical brewer of Steps: 4 Entrance Stairs-Rails: Right Home Layout: Two level;Able to live on main level with bedroom/bathroom               Home Equipment: None          Prior Functioning/Environment Level of Independence: Needs assistance    ADL's / Homemaking Assistance Needed: indpendent with basic ADLs. Pt.'s wife assists with meal preparation, and medication management.            OT Problem List: Decreased strength;Decreased activity tolerance;Decreased range of motion;Impaired tone;Decreased knowledge of use of DME or AE      OT Treatment/Interventions: Self-care/ADL training;Therapeutic exercise;Patient/family  education;DME and/or AE instruction    OT Goals(Current goals can be found in the care plan section) Acute Rehab OT Goals Patient Stated Goal: To regain independence OT Goal Formulation: With patient Potential to Achieve Goals: Good ADL Goals Pt Will Perform Lower Body Dressing: Independently Pt Will Transfer to Toilet: Independently  OT Frequency: Min 2X/week   Barriers to D/C:            Co-evaluation              AM-PAC PT "6 Clicks" Daily Activity     Outcome Measure Help from another person eating meals?: None Help from another person taking care of personal grooming?: None Help from another person toileting, which includes using toliet, bedpan, or urinal?: Total(Foley Catheter) Help from another person bathing (including washing, rinsing, drying)?: A Lot Help from another person to put on and taking off regular upper body clothing?: A Little Help from another person to put on and taking off regular lower body clothing?: A Lot 6 Click Score: 16   End of Session    Activity Tolerance: Patient tolerated treatment well Patient left: in bed;with bed alarm set;with call bell/phone within reach;with family/visitor present                   Time: 0303-0328 OT Time Calculation (min): 25 min Charges:  OT General Charges $OT Visit: 1 Visit OT Evaluation $OT Eval Moderate Complexity: 1 Mod G-Codes:     Harrel Carina, MS, OTR/L   Harrel Carina, MS, OTR/L 10/25/2017, 5:26 PM

## 2017-10-25 NOTE — Progress Notes (Signed)
Initial Nutrition Assessment  DOCUMENTATION CODES:   Morbid obesity  INTERVENTION:  Provide Nepro Shake po once daily, each supplement provides 425 kcal and 19 grams protein.  Encouraged patient to eat a good source of HBV protein at meals (meat or eggs).  Reviewed "High Potassium Food List" and "Lower Potassium Food List" handouts from the Academy of [employer] and Dietetics with patient's wife per her request.  NUTRITION DIAGNOSIS:   Inadequate oral intake related to decreased appetite as evidenced by per patient/family report.  GOAL:   Patient will meet greater than or equal to 90% of their needs  MONITOR:   PO intake, Supplement acceptance, Labs, Weight trends, I & O's  REASON FOR ASSESSMENT:   Malnutrition Screening Tool    ASSESSMENT:   72 year old male with PMHx of depression, anxiety, DM type 2, HTN, GERD, CKD, metastatic melanoma (liver, lung, and bone involvement) who is admitted with urinary retention, AKI on CKD, and generalized weakness.   -Patient starting XRT for lumbar spine and SI joint inpatient.  Met with patient, his wife, and his daughter at bedside. Patient reports he has had a decreased appetite for the past month. He reports he is having taste changes and an absence of hunger. He did recently start on an appetite so he is hoping this will help some. He still tries to eat 3 meals per day but is eating much less at meals than he used to. He is also on a low-potassium diet at home because he reports at one time having dangerously high potassium levels. For breakfast he may have pancakes, eggs, or [place] toast. Lunch may be a sandwich or fruit (lately only eating 1/2 sandwich). Dinner is usually a small amount of chicken with vegetables. Patient feels like he is not getting enough protein with his poor appetite because it is hard for him to eat meat now. He is amenable to drinking a protein shake as long as it is low in potassium. Reviewed options available that  are lower in potassium and he would like to try Nepro.  Patient reports he has lost weight down to around 327 lbs. He reports that he used to weigh more. On 10/02/2017 he was 327.2 lbs. Could not get his bed scale to work today (kept saying he was not in correct position even after straightening bed). He cannot recall how much he used to weigh, but per chart he actually looks weight-stable from 2017 (though missing weights from 2018).  Medications reviewed and include: ferrous sulfate 325 mg daily, glipizide, Novolog 0-9 units TID, Novolog 0-5 units QHS, Levemir 10 units BID, Megace, Lovaza 1 gram daily, pantoprazole, senna-docusate, Flomax.  Labs reviewed: CBG 292-318, Sodium 133, CO2 20, BUN 84, Creatinine 3.55.  Patient does not meet criteria for malnutrition at this time but is at risk for malnutrition if inadequate intake continues.  NUTRITION - FOCUSED PHYSICAL EXAM:    Most Recent Value  Orbital Region  No depletion  Upper Arm Region  No depletion  Thoracic and Lumbar Region  No depletion  Buccal Region  No depletion  Temple Region  No depletion  Clavicle Bone Region  No depletion  Clavicle and Acromion Bone Region  No depletion  Scapular Bone Region  No depletion  Dorsal Hand  No depletion  Patellar Region  No depletion  Anterior Thigh Region  No depletion  Posterior Calf Region  No depletion  Edema (RD Assessment)  None  Hair  Reviewed  Eyes  Reviewed  Mouth  Reviewed  Skin  Reviewed  Nails  Reviewed     Diet Order:  Diet Carb Modified Fluid consistency: Thin; Room service appropriate? Yes  EDUCATION NEEDS:   Education needs have been addressed  Skin:  Skin Assessment: Reviewed RN Assessment  Last BM:  10/25/2017 - small type 1  Height:   Ht Readings from Last 1 Encounters:  10/23/17 6' 1"  (1.854 m)    Weight:   Wt Readings from Last 1 Encounters:  10/23/17 (!) 326 lb (147.9 kg)    Ideal Body Weight:  83.6 kg  BMI:  Body mass index is 43.01  kg/m.  Estimated Nutritional Needs:   Kcal:  2200-2400  Protein:  110-120 grams  Fluid:  2 L/day (25 mL/kg IBW)  Willey Blade, MS, RD, LDN Office: [phone] Pager: [phone] After Hours/Weekend Pager: [phone]

## 2017-10-25 NOTE — Progress Notes (Signed)
PT Cancellation Note  Patient Details Name: Tyler Henderson MRN: [id number] DOB: 06-Jul-1946   Cancelled Treatment:      PT consult received, chart reviewed. Per nursing, Pt's LSO brace has not arrived.  Will reattempt eval at a later date/time once brace has arrived.   Khyre Germond Mondrian-Pardue, SPT 10/25/2017, 11:57 AM

## 2017-10-25 NOTE — Evaluation (Signed)
Physical Therapy Evaluation Patient Details Name: Tyler Henderson MRN: [id number] DOB: 06-15-1946 Today's Date: 10/25/2017   History of Present Illness  Pt is a 72 y.o. male presenting to hospital with urinary retention and constipation.  Pt admitted to hospital with acute on chronic kidney disease, acute urinary retention with h/o metastatic bladder lesion, hyperkalemia, and hyponatremia.  MRI showing R L4 metastasis with mild pathologic fx, tumor expansion into epidural space resulting in traversing R L4 nerve impingement.  PMH includes metastatic melanoma to lungs/liver/bones, bladder CA, CKD, depression, htn, RLS, L TKR.  Clinical Impression  Prior to hospital admission, pt was ambulatory with RW (h/o R knee buckling but no falls using RW).  Pt lives with his wife on main floor of home with 4 steps to enter with railing.  Currently pt is SBA supine to sit via logrolling; CGA with transfers using RW; and CGA ambulating 120 feet with RW (mild decreased stance time R LE; mild increased R knee flexion during R LE stance phase with distance ambulated but no knee buckling noted; steady with RW; SOB noted with distance ambulated).  Pt would benefit from skilled PT to address noted impairments and functional limitations (see below for any additional details).  Upon hospital discharge, recommend pt discharge to home with HHPT and support of family.    Follow Up Recommendations Home health PT;Supervision for mobility/OOB    Equipment Recommendations  Rolling walker with 5" wheels    Recommendations for Other Services       Precautions / Restrictions Precautions Precautions: Fall;Back Precaution Comments: Chemotherapy Precautions; LSO Brace on when up, and when equal to, or above 45 degrees; R chest port Required Braces or Orthoses: Spinal Brace Spinal Brace: Other (comment) Spinal Brace Comments: LSO on when up greater than or equal to 45 degrees Restrictions Weight Bearing Restrictions: No       Mobility  Bed Mobility Overal bed mobility: Needs Assistance Bed Mobility: Rolling;Sidelying to Sit Rolling: Modified independent (Device/Increase time)(use of bed rail) Sidelying to sit: Supervision Supine to sit: Supervision     General bed mobility comments: vc's for logrolling; increased effort to perform from pt but no physical assist required  Transfers Overall transfer level: Needs assistance Equipment used: Rolling walker (2 wheeled) Transfers: Sit to/from [facility] Sit to Stand: Min guard(from bed and recliner) Stand pivot transfers: Min guard(bed to recliner)       General transfer comment: increased effort to stand but no physical assist required  Ambulation/Gait Ambulation/Gait assistance: Min guard Ambulation Distance (Feet): 120 Feet Assistive device: Rolling walker (2 wheeled)   Gait velocity: mildly decreased   General Gait Details: mild decreased stance time R LE; mild increased R knee flexion (during R LE stance phase) with distance ambulated but no knee buckling noted; steady with RW; SOB noted with distance ambulated  Stairs            Wheelchair Mobility    Modified Rankin (Stroke Patients Only)       Balance Overall balance assessment: Needs assistance Sitting-balance support: No upper extremity supported;Feet supported Sitting balance-Leahy Scale: Good Sitting balance - Comments: steady sitting reaching within BOS   Standing balance support: Single extremity supported Standing balance-Leahy Scale: Poor Standing balance comment: requires at least single UE support for standing activities                             Pertinent Vitals/Pain Pain Assessment: No/denies pain Pain Score:  0-No pain Pain Intervention(s): Limited activity within patient's tolerance;Monitored during session;Repositioned  Vitals (HR and O2 on room air) stable and WFL throughout treatment session.    Home Living Family/patient  expects to be discharged to:: Private residence Living Arrangements: Spouse/significant other Available Help at Discharge: Family Type of Home: House Home Access: Stairs to enter Entrance Stairs-Rails: Left(from back entrance) Entrance Stairs-Number of Steps: [place]: Two level;Able to live on main level with bedroom/bathroom Home Equipment: Shower seat - built in;Shower seat;Walker - 2 wheels(rollator being delivered soon)      Prior Function Level of Independence: Needs assistance   Gait / Transfers Assistance Needed: Ambulates with RW; no h/o falls although h/o R knee buckling   ADL's / Homemaking Assistance Needed: Independent with basic ADLs. Pt's wife assists with meal preparation and medication management.        Hand Dominance   Dominant Hand: Right    Extremity/Trunk Assessment   Upper Extremity Assessment Upper Extremity Assessment: Generalized weakness    Lower Extremity Assessment Lower Extremity Assessment: Generalized weakness(AROM at least 3/5 hip flexion, knee flexion/extension and DF)    Cervical / Trunk Assessment Cervical / Trunk Assessment: Normal  Communication   Communication: No difficulties  Cognition Arousal/Alertness: Awake/alert Behavior During Therapy: WFL for tasks assessed/performed Overall Cognitive Status: Within Functional Limits for tasks assessed                                        General Comments General comments (skin integrity, edema, etc.): Pt resting in bed with LSO donned upon PT entry.  Pt's wife left for session.  Nursing cleared pt for participation in physical therapy.  Pt agreeable to PT session.    Exercises  Education on LSO use, proper fit, and when to wear it.   Assessment/Plan    PT Assessment Patient needs continued PT services  PT Problem List Decreased strength;Decreased activity tolerance;Decreased balance;Decreased mobility;Decreased knowledge of precautions       PT Treatment  Interventions DME instruction;Gait training;Stair training;Functional mobility training;Therapeutic activities;Therapeutic exercise;Balance training;Patient/family education    PT Goals (Current goals can be found in the Care Plan section)  Acute Rehab PT Goals Patient Stated Goal: to improve mobility PT Goal Formulation: With patient Time For Goal Achievement: 11/08/17 Potential to Achieve Goals: Good    Frequency Min 2X/week   Barriers to discharge        Co-evaluation               AM-PAC PT "6 Clicks" Daily Activity  Outcome Measure Difficulty turning over in bed (including adjusting bedclothes, sheets and blankets)?: A Little Difficulty moving from lying on back to sitting on the side of the bed? : A Little Difficulty sitting down on and standing up from a chair with arms (e.g., wheelchair, bedside commode, etc,.)?: Unable Help needed moving to and from a bed to chair (including a wheelchair)?: A Little Help needed walking in hospital room?: A Little Help needed climbing 3-5 steps with a railing? : A Little 6 Click Score: 16    End of Session Equipment Utilized During Treatment: Gait belt;Back brace Activity Tolerance: Patient tolerated treatment well Patient left: in chair;with call bell/phone within reach;with chair alarm set Nurse Communication: Mobility status;Precautions;Other (comment)(LSO precautions) PT Visit Diagnosis: Other abnormalities of gait and mobility (R26.89);Muscle weakness (generalized) (M62.81);Difficulty in walking, not elsewhere classified (R26.2)    Time: 9629-5284 PT  Time Calculation (min) (ACUTE ONLY): 33 min   Charges:   PT Evaluation $PT Eval Low Complexity: 1 Low PT Treatments $Therapeutic Activity: 8-22 mins   PT G CodesLeitha Bleak, PT 10/25/17, 6:06 PM [phone]

## 2017-10-25 NOTE — Progress Notes (Signed)
[place]  Telephone:([phone]) [phone] Fax:(336) [phone]  ID: Rolene Course OB: 09-23-1945  MR#: [id number]  AOZ#:[id number]  Patient Care Team: Sofie Hartigan, MD as PCP - General (Family Medicine)  CHIEF COMPLAINT: Stage IV metastatic melanoma with liver, lung, and bony metastasis.  INTERVAL HISTORY: Patient returns to clinic today for further evaluation and hospital follow-up.  He was recently admitted for worsening performance status and acute renal failure.  He continues to have a poor appetite, but his performance status has mildly improved.  His creatinine is slowly trending down.  He has no neurologic complaints.  He denies any recent fevers. He has no chest pain or shortness of breath.  He denies any nausea, vomiting, constipation, or diarrhea. He does not complain of melena or hematochezia today.  He now has a Foley catheter for urinary retention.  Patient offers no further specific complaints today.  REVIEW OF SYSTEMS:   Review of Systems  Constitutional: Positive for malaise/fatigue. Negative for fever and weight loss.  Respiratory: Negative.  Negative for cough, hemoptysis and shortness of breath.   Cardiovascular: Negative.  Negative for chest pain and leg swelling.  Gastrointestinal: Negative.  Negative for abdominal pain, blood in stool and melena.  Genitourinary: Negative.  Negative for dysuria.  Musculoskeletal: Positive for back pain and joint pain.  Skin: Negative.  Negative for rash.  Neurological: Negative.  Negative for dizziness, sensory change, focal weakness and weakness.  Psychiatric/Behavioral: The patient is nervous/anxious and has insomnia.     As per HPI. Otherwise, a complete review of systems is negative.  PAST MEDICAL HISTORY: Past Medical History:  Diagnosis Date  . Anxiety   . Arthritis   . Bladder cancer ([place])   . Chronic kidney disease   . Depression   . Diabetes ([place])   . Dysrhythmia    atrial fib  . GERD  (gastroesophageal reflux disease)   . Hypertension   . Restless leg syndrome   . Shortness of breath dyspnea    with exertion  . Skin cancer   . Sleep apnea    CPAP "once in awhile"  . 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 APPENDECTOMY    . JOINT REPLACEMENT Left    Total Knee Replacement  . KNEE ARTHROSCOPY Left   . PORTA CATH INSERTION N/A 10/19/2017   Procedure: PORTA CATH INSERTION;  Surgeon: Algernon Huxley, MD;  Location: [facility];  Service: Cardiovascular;  Laterality: N/A;  . REPLACEMENT TOTAL KNEE Left 2012   Dr. Little Ishikawa, Jr. [facility]  . TRANSURETHRAL RESECTION OF BLADDER TUMOR N/A 03/14/2016   Procedure: TRANSURETHRAL RESECTION OF BLADDER TUMOR (TURBT);  Surgeon: Hollice Espy, MD;  Location: [facility];  Service: Urology;  Laterality: N/A;    FAMILY HISTORY: Family History  Problem Relation Age of Onset  . Cancer Mother        Breast  . Heart failure Father   . Prostate cancer Neg Hx   . Bladder Cancer Neg Hx     ADVANCED DIRECTIVES (Y/N):  N  HEALTH MAINTENANCE: Social History   Tobacco Use  . Smoking status: Former Smoker    Packs/day: 1.00    Years: 20.00    Pack years: 20.00    Types: Cigarettes    Last attempt to quit: 1979    Years since quitting: 40.2  . Smokeless tobacco: Never Used  Substance Use Topics  . Alcohol use:  Yes    Alcohol/week: 1.2 oz    Types: 2 Cans of beer per week    Comment: occassional  . Drug use: No     Colonoscopy:  PAP:  Bone density:  Lipid panel:  No Known Allergies  Current Outpatient Medications  Medication Sig Dispense Refill  . acetaminophen (TYLENOL) 650 MG CR tablet Take 650 mg by mouth every 12 (twelve) hours as needed for pain.    . ALPRAZolam (XANAX) 0.5 MG tablet Take 1 tablet (0.5 mg total) by mouth at bedtime as needed for anxiety. 30 tablet 0  . aspirin EC 81 MG tablet Take 81 mg by mouth every evening.     .  atorvastatin (LIPITOR) 40 MG tablet Take 40 mg by mouth in the evening    . carvedilol (COREG) 25 MG tablet Take 25 mg by mouth twice daily    . docusate sodium (COLACE) 100 MG capsule Take 1 capsule (100 mg total) by mouth 2 (two) times daily. (Patient taking differently: Take 100-200 mg by mouth daily as needed for moderate constipation. ) 60 capsule 0  . ferrous sulfate (SLOW RELEASE IRON) 160 (50 Fe) MG TBCR SR tablet Take 160 mg by mouth 2 (two) times daily.     . FLUoxetine (PROZAC) 10 MG capsule Take 10 mg by mouth daily.     . gabapentin (NEURONTIN) 300 MG capsule Take 300 mg by mouth every evening.     . glipiZIDE (GLUCOTROL) 10 MG tablet Take 10 mg by mouth in the evening    . lidocaine-prilocaine (EMLA) cream Apply to affected area once 30 g 3  . lisinopril (PRINIVIL,ZESTRIL) 40 MG tablet Take 40 mg by mouth in the evening    . megestrol (MEGACE) 40 MG tablet Take 1 tablet (40 mg total) by mouth daily. 30 tablet 2  . Multiple Vitamin (MULTI-VITAMINS) TABS Take 1 tablet by mouth daily. Alive Mens Multivitamin    . Nutritional Supplements (FEEDING SUPPLEMENT, NEPRO CARB STEADY,) LIQD Take 237 mLs by mouth daily. 7110 mL 0  . Omega-3 Fatty Acids (FISH OIL PO) Take 1 capsule by mouth 2 (two) times daily.     . omeprazole (PRILOSEC) 20 MG capsule Take 20 mg by mouth in the evening    . Oxycodone HCl 10 MG TABS Take 1 tablet (10 mg total) by mouth 2 (two) times daily as needed. (Patient taking differently: Take 10 mg by mouth 2 (two) times daily as needed (for pain). ) 60 tablet 0  . patiromer (VELTASSA) 8.4 g packet Take 8.4 g by mouth 2 (two) times daily.    . tamsulosin (FLOMAX) 0.4 MG CAPS capsule Take 1 capsule (0.4 mg total) by mouth daily after supper. 30 capsule 0   No current facility-administered medications for this visit.    Facility-Administered Medications Ordered in Other Visits  Medication Dose Route Frequency Provider Last Rate Last Dose  . sodium chloride flush (NS) 0.9 %  injection 10 mL  10 mL Intravenous PRN Finnegan, Timothy J, MD   10 mL at 10/27/17 0915    OBJECTIVE: Vitals:   10/27/17 0935  BP: (!) 79/44  Pulse: 96  Temp: 97.8 F (36.6 C)  SpO2: 94%     There is no height or weight on file to calculate BMI.    ECOG FS:2 - Symptomatic, <50% confined to bed  General: Well-developed, well-nourished, no acute distress.  Sitting in a wheelchair. Eyes: Pink conjunctiva, anicteric sclera. Lungs: Clear to auscultation bilaterally. Heart: Regular rate   and rhythm. No rubs, murmurs, or gallops. Abdomen: Soft, nontender, nondistended. No organomegaly noted, normoactive bowel sounds. Musculoskeletal: No edema, cyanosis, or clubbing.  Foley catheter in place. Neuro: Alert, answering all questions appropriately. Cranial nerves grossly intact. Skin: No rashes or petechiae noted. Psych: Normal affect.   LAB RESULTS:  Lab Results  Component Value Date   NA 130 (L) 10/27/2017   K 4.9 10/27/2017   CL 102 10/27/2017   CO2 18 (L) 10/27/2017   GLUCOSE 307 (H) 10/27/2017   BUN 93 (H) 10/27/2017   CREATININE 3.10 (H) 10/27/2017   CALCIUM 8.0 (L) 10/27/2017   PROT 5.9 (L) 10/27/2017   ALBUMIN 2.1 (L) 10/27/2017   AST 26 10/27/2017   ALT 20 10/27/2017   ALKPHOS 200 (H) 10/27/2017   BILITOT 0.3 10/27/2017   GFRNONAA 18 (L) 10/27/2017   GFRAA 21 (L) 10/27/2017    Lab Results  Component Value Date   WBC 7.6 10/27/2017   NEUTROABS 5.8 10/27/2017   HGB 7.8 (L) 10/27/2017   HCT 24.2 (L) 10/27/2017   MCV 83.7 10/27/2017   PLT 247 10/27/2017     STUDIES: Dg Lumbar Spine Complete W/bend  Result Date: 10/24/2017 CLINICAL DATA:  72 y/o  M; metastatic melanoma to lumbar spine. EXAM: LUMBAR SPINE - COMPLETE WITH BENDING VIEWS COMPARISON:  10/24/2017 lumbar spine MRI. FINDINGS: Five lumbar type non-rib-bearing vertebral bodies. Straightening of lumbar lordosis without listhesis. No dynamic instability on flexion and extension views. Mild loss of L3-4 and L5-S1  intervertebral disc space height. Multilevel small anterior marginal osteophytes. Metastatic lesions within the L4 vertebral body and paraspinal muscles prior lumbar MRI poorly visualized. IMPRESSION: 1.  No acute fracture or dislocation identified. 2. Lumbar spondylosis with loss of disc space height at L3-4 and L5-S1. 3. No dynamic instability. Electronically Signed   By: Kristine Garbe M.D.   On: 10/24/2017 16:10   Dg Abdomen 1 View  Result Date: 10/23/2017 CLINICAL DATA:  Bladder carcinoma.  Pain and inability to urinate EXAM: ABDOMEN - 1 VIEW COMPARISON:  PET-CT September 21, 2017 FINDINGS: There is moderate stool throughout the colon. There is no bowel dilatation or air-fluid level to suggest bowel obstruction. No free air. There are probable phleboliths in the pelvis. Urinary bladder does not appear distended by radiography. IMPRESSION: No bowel obstruction or free air. Urinary bladder does not appear distended by radiography. Electronically Signed   By: Lowella Grip III M.D.   On: 10/23/2017 10:51   Mr Jeri Cos SW Contrast  Result Date: 10/11/2017 CLINICAL DATA:  Bladder cancer with metastatic disease to the liver, skeleton, and lungs. EXAM: MRI HEAD WITHOUT AND WITH CONTRAST TECHNIQUE: Multiplanar, multiecho pulse sequences of the brain and surrounding structures were obtained without and with intravenous contrast. CONTRAST:  55m MULTIHANCE GADOBENATE DIMEGLUMINE 529 MG/ML IV SOLN COMPARISON:  PET scan 09/21/2017. FINDINGS: Brain: No focal enhancing lesions are present to suggest metastatic disease the brain or meninges. Minimal white matter changes are within normal limits for age. No acute infarct, hemorrhage, or mass lesion is present. Ventricles are of normal size. The internal auditory canals are within normal limits bilaterally. The brainstem and cerebellum are normal. No significant extra-axial fluid collection is present. Vascular: Flow is present in the major intracranial  arteries. Globes and orbits are at that Skull and upper cervical spine: The calvarium is intact. Focal lesions or enhancement are evident. The skull base is within normal limits. The craniocervical junction is normal. Sinuses/Orbits: Mild mucosal thickening is present along the  floor of the maxillary sinuses bilaterally. No fluid levels are present. The paranasal sinuses and mastoid air cells are otherwise clear. Globes and orbits are within normal limits. IMPRESSION: 1. No evidence for metastatic disease to the brain or meninges. 2. Normal MRI appearance of the brain for age. 3. Minimal maxillary sinus disease. Electronically Signed   By: San Morelle M.D.   On: 10/11/2017 11:54   Mr Lumbar Spine Wo Contrast  Result Date: 10/24/2017 CLINICAL DATA:  Difficulty urinating for 3 days. History of metastatic melanoma, bladder cancer and chronic kidney disease. EXAM: MRI LUMBAR SPINE WITHOUT CONTRAST TECHNIQUE: Multiplanar, multisequence MR imaging of the lumbar spine was performed. No intravenous contrast was administered. COMPARISON:  PET CT September 21, 2017 FINDINGS: SEGMENTATION: For the purposes of this report, the last well-formed intervertebral disc is reported as L5-S1. ALIGNMENT: Maintained lumbar lordosis. No malalignment. VERTEBRAE:30 mm low T1, bright STIR signal mass RIGHT L4 vertebral body extending to the pedicle with mild acute fracture, less than 15% height loss superior endplate associated with Schmorl's node. Low T1, bright STIR signal RIGHT T12 facet consistent with metastasis. Ovoid subcentimeter low T1, bright STIR signal inferior to LEFT L2 pedicle, likely extra osseous cyst. Remaining vertebral bodies intact. Congenital canal narrowing on the basis of foreshortened pedicles. Moderate to severe L5-S1 disc height loss with mild chronic discogenic endplate changes. Mild disc desiccation L2-3 through L5-S1. CONUS MEDULLARIS AND CAUDA EQUINA: Conus medullaris terminates at L1-2 and  demonstrates normal morphology and signal characteristics. Cauda equina is normal. PARASPINAL AND OTHER SOFT TISSUES: T2 bright cyst LEFT kidney. Mild bright interstitial STIR signal bilateral paraspinal muscles seen with low-grade strain. 9 mm round cystic lesion RIGHT paraspinal soft tissues fluid fluid level. At least 4.8 cm mass contiguous with RIGHT sacroiliac joint undermining the ileo psoas muscles. DISC LEVELS: L1-2, L2-3: No disc bulge, canal stenosis nor neural foraminal narrowing. Mild facet arthropathy. L3-4: RIGHT L4 osseous metastasis with a 9 x 7 mm tumoral extension to RIGHT lateral recess displacing the traversing RIGHT L4 nerve. Small broad-based disc bulge and mild facet arthropathy. Mild canal stenosis. Mild bilateral neural foraminal narrowing. L4-5: Small broad-based disc bulge, annular fissure. Mild facet arthropathy and ligamentum flavum redundancy. Moderate canal stenosis. Moderate RIGHT, mild LEFT neural foraminal narrowing. L5-S1: Moderate broad-based disc bulge. Mild facet arthropathy and ligamentum flavum redundancy. No canal stenosis. Mild to moderate bilateral neural foraminal narrowing. IMPRESSION: 1. RIGHT L4 metastasis with mild pathologic fracture, tumor expansion into epidural space resulting in traversing RIGHT L4 nerve impingement. RIGHT T12 facet metastasis. 2. 2 subcentimeter cystic paraspinal muscle masses concerning for metastatic deposits. At least 4.8 cm RIGHT pelvic metastasis. 3. 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 Moderate canal stenosis L4-5, mild canal stenosis L3-4. 4. Neural foraminal narrowing L3-4 through L5-S1: Moderate on the RIGHT at L4-5. Electronically Signed   By: Elon Alas M.D.   On: 10/24/2017 01:31   US Renal  Result Date: 10/23/2017 CLINICAL DATA:  Acute renal injury, history of metastatic bladder carcinoma EXAM: RENAL / URINARY TRACT ULTRASOUND COMPLETE COMPARISON:  07/14/2017. FINDINGS: Right  Kidney: Length: 11.7 cm. Echogenicity within normal limits. No mass or hydronephrosis visualized. Left Kidney: Length: 12.1 cm. Cysts are noted within the left kidney. The largest of these measures 9.1 cm in greatest dimension. These are stable when compared with prior CT examination. No hydronephrosis is noted. Bladder: Appears normal for degree of bladder distention. Note is made of known lesions within the right lobe of the  liver. IMPRESSION: Left renal cystic change. Stable masses in the liver are noted. Electronically Signed   By: Mark  Lukens M.D.   On: 10/23/2017 15:24   Us Biopsy (liver)  Result Date: 10/06/2017 INDICATION: Concern for metastatic lung cancer, now with multiple hypermetabolic liver lesions. Please perform ultrasound-guided liver lesion biopsy for tissue diagnostic purposes. EXAM: ULTRASOUND GUIDED LIVER LESION BIOPSY COMPARISON:  PET-CT - 09/21/2017 MEDICATIONS: None ANESTHESIA/SEDATION: Fentanyl 50 mcg IV; Versed 2 mg IV Total Moderate Sedation time:  14 Minutes. The patient's level of consciousness and vital signs were monitored continuously by radiology nursing throughout the procedure under my direct supervision. COMPLICATIONS: None immediate. PROCEDURE: Informed written consent was obtained from the patient after a discussion of the risks, benefits and alternatives to treatment. The patient understands and consents the procedure. A timeout was performed prior to the initiation of the procedure. Ultrasound scanning was performed of the right upper abdominal quadrant demonstrates multiple mixed echogenic lesions and masses within both the right and left lobes of the liver. An approximately 3.2 x 2.5 cm hypoechoic lesion/mass within the medial segment of left lobe liver correlating with the hypoattenuating lesion seen on preceding PET-CT image 153, series 4, was targeted for biopsy given lesion location and sonographic window. The procedure was planned. The right upper abdominal  quadrant was prepped and draped in the usual sterile fashion. The overlying soft tissues were anesthetized with 1% lidocaine with epinephrine. A 17 gauge, 6.8 cm co-axial needle was advanced into a peripheral aspect of the lesion. This was followed by 5 core biopsies with an 18 gauge core device under direct ultrasound guidance. The coaxial needle tract was embolized with a small amount of Gel-Foam slurry and superficial hemostasis was obtained with manual compression. Post procedural scanning was negative for definitive area of hemorrhage or additional complication. A dressing was placed. The patient tolerated the procedure well without immediate post procedural complication. IMPRESSION: Technically successful ultrasound guided core needle biopsy of indeterminate lesion/mass within the medial segment of the left lobe of the liver. Electronically Signed   By: John  Watts M.D.   On: 10/06/2017 12:02    ASSESSMENT: Stage IV metastatic melanoma with liver, lung, and bony metastasis.  PLAN:    1. Stage IV metastatic melanoma with liver, lung, and bony metastasis: PET scan results from September 13, 2017 reviewed independently with widespread metastatic disease.  MRI of the brain negative for disease.  Liver biopsy consistent with metastatic melanoma. BRAF mutation has been ordered and is pending at time of dictation. After lengthy discussion with the patient and his family, he wishes to proceed with palliative combination immunotherapy using nivolumab and ipilimumab.  Plan giving 4 cycles every 3 weeks of combination therapy and then transition to single agent maintenance nivolumab every 2 weeks until progression of disease.  Patient received cycle 1 of 4 of nivolumab and ipilimumab last week.  Return to clinic in 2 weeks for further evaluation and consideration of cycle 2. 2.  Acute renal failure: Likely multifactorial including urinary retention as well as dehydration.  Patient's creatinine is improving and he  has a Foley catheter in place.  He reports follow-up with both urology and nephrology next week.  Patient received 1 L of IV fluids today.    2.  Anxiety: Continue Xanax of 0.5 mg twice per day as needed. 3.  Pain: Continue oxycodone 10 mg tabs as needed.  Continue follow-up with radiation oncology as scheduled. 4.  Hyperglycemia: Continue current medications as prescribed. 5.    Hypotension: 1 L of fluids as above.  Patient has been instructed to continue Coreg, but discontinue lisinopril until he is evaluated by nephrology next week.  Approximately 30 minutes was spent in discussion of which greater than 50% was consultation.  Patient expressed understanding and was in agreement with this plan. He also understands that He can call clinic at any time with any questions, concerns, or complaints.   Cancer Staging Malignant melanoma, metastatic [facility]) Staging form: Melanoma of the Skin, AJCC 8th Edition - Clinical stage from 10/15/2017: Stage IV (cTX, cNX, pM1c(1)) - Signed by Lloyd Huger, MD on 10/15/2017   Lloyd Huger, MD   10/27/2017 1:50 PM

## 2017-10-25 NOTE — Progress Notes (Signed)
PT Cancellation Note  Patient Details Name: Tyler Henderson MRN: [id number] DOB: [birth year]   Cancelled Treatment:      PT consult received, chart reviewed. PT attempted eval, however OT eval was in session.  Will reattempt eval at a later date/time when medically appropriate.   Maudean Hoffmann Mondrian-Pardue, SPT 10/25/2017, 4:23 PM

## 2017-10-25 NOTE — Progress Notes (Signed)
Inpatient Diabetes Program Recommendations  AACE/ADA: New Consensus Statement on Inpatient Glycemic Control (2015)  Target Ranges:  Prepandial:   less than 140 mg/dL      Peak postprandial:   less than 180 mg/dL (1-2 hours)      Critically ill patients:  140 - 180 mg/dL   Lab Results  Component Value Date   GLUCAP 292 (H) 10/25/2017   HGBA1C 8.3 (H) 10/24/2017    Review of Glycemic ControlResults for Tyler Henderson, Tyler Henderson (MRN [id number]) as of 10/25/2017 10:13  Ref. Range 10/24/2017 07:37 10/24/2017 11:35 10/24/2017 17:05 10/24/2017 21:02 10/25/2017 07:28  Glucose-Capillary Latest Ref Range: 65 - 99 mg/dL 216 (H) 315 (H) 318 (H) 313 (H) 292 (H)    Diabetes history: Type 2 DM Outpatient Diabetes medications: Glucotrol 10 mg q evening, Actos 45 mg q AM Current orders for Inpatient glycemic control:  Novolog sensitive tid with meals and HS, Glucotrol 10 mg daily Inpatient Diabetes Program Recommendations:    Note patient did receive steroids on 10/23/17 which likely has increased blood sugars.  Blood sugars remain elevated.  While in the hospital, may consider adding Levemir 10 units bid to control blood sugars.    Thanks,  Adah Perl, RN, BC-ADM Inpatient Diabetes Coordinator Pager [phone] (8a-5p)

## 2017-10-25 NOTE — Progress Notes (Signed)
[facility] [facility] NAME: Tyler Henderson    MR#:  [id number]  DATE OF BIRTH:  [birth year]  SUBJECTIVE:   Patient went for XRT today  LSO brace ordered  Had several bowel movements yesterday  REVIEW OF SYSTEMS:    Review of Systems  Constitutional: Negative for fever, chills weight loss HENT: Negative for ear pain, nosebleeds, congestion, facial swelling, rhinorrhea, neck pain, neck stiffness and ear discharge.   Respiratory: Negative for cough, shortness of breath, wheezing  Cardiovascular: Negative for chest pain, palpitations and leg swelling.  Gastrointestinal: Negative for heartburn, abdominal pain, vomiting, diarrhea  Genitourinary: Negative for dysuria, urgency, frequency, hematuria Musculoskeletal: Negative for back pain or joint pain Neurological: Negative for dizziness, seizures, syncope, focal weakness,  numbness and headaches.  Hematological: Does not bruise/bleed easily.  Psychiatric/Behavioral: Negative for hallucinations, confusion, dysphoric mood    Tolerating Diet: yes      DRUG ALLERGIES:  No Known Allergies  VITALS:  Blood pressure (!) 113/46, pulse 78, temperature 98.3 F (36.8 C), temperature source Oral, resp. rate 18, height 6\' 1"  (1.854 m), weight (!) 147.9 kg (326 lb), SpO2 97 %.  PHYSICAL EXAMINATION:  Constitutional: Appears well-developed and well-nourished. No distress. HENT: Normocephalic. Marland Kitchen Oropharynx is clear and moist.  Eyes: Conjunctivae and EOM are normal. PERRLA, no scleral icterus.  Neck: Normal ROM. Neck supple. No JVD. No tracheal deviation. CVS: RRR, S1/S2 +, no murmurs, no gallops, no carotid bruit.  Pulmonary: Effort and breath sounds normal, no stridor, rhonchi, wheezes, rales.  Abdominal: Soft. BS +,  no distension, tenderness, rebound or guarding.  Musculoskeletal: Normal range of motion. No edema and no tenderness.  Neuro: Alert. CN 2-12 grossly intact. No focal deficits. Skin:  Skin is warm and dry. No rash noted. Psychiatric: Normal mood and affect.      LABORATORY PANEL:   CBC Recent Labs  Lab 10/25/17 0437  WBC 13.7*  HGB 7.8*  HCT 25.1*  PLT 315   ------------------------------------------------------------------------------------------------------------------  Chemistries  Recent Labs  Lab 10/24/17 0448 10/25/17 0437  NA 134* 133*  K 5.1 4.9  CL 105 105  CO2 18* 20*  GLUCOSE 239* 315*  BUN 78* 84*  CREATININE 4.11* 3.55*  CALCIUM 7.8* 8.1*  AST 28  --   ALT 14*  --   ALKPHOS 230*  --   BILITOT 0.6  --    ------------------------------------------------------------------------------------------------------------------  Cardiac Enzymes No results for input(s): TROPONINI in the last 168 hours. 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 EXAM: LUMBAR SPINE - COMPLETE WITH BENDING VIEWS COMPARISON:  10/24/2017 lumbar spine MRI. FINDINGS: Five lumbar type non-rib-bearing vertebral bodies. Straightening of lumbar lordosis without listhesis. No dynamic instability on flexion and extension views. Mild loss of L3-4 and L5-S1 intervertebral disc space height. Multilevel small anterior marginal osteophytes. Metastatic lesions within the L4 vertebral body and paraspinal muscles prior lumbar MRI poorly visualized. IMPRESSION: 1.  No acute fracture or dislocation identified. 2. Lumbar spondylosis with loss of disc space height at L3-4 and L5-S1. 3. No dynamic instability. Electronically Signed   By: Kristine Garbe M.D.   On: 10/24/2017 16:10   Mr Lumbar Spine Wo Contrast  Result Date: 10/24/2017 CLINICAL DATA:  Difficulty urinating for 3 days. History of metastatic melanoma, bladder cancer and chronic kidney disease. EXAM: MRI LUMBAR SPINE  WITHOUT CONTRAST TECHNIQUE: Multiplanar, multisequence MR imaging of  the lumbar spine was performed. No intravenous contrast was administered. COMPARISON:  PET CT September 21, 2017 FINDINGS: SEGMENTATION: For the purposes of this report, the last well-formed intervertebral disc is reported as L5-S1. ALIGNMENT: Maintained lumbar lordosis. No malalignment. VERTEBRAE:30 mm low T1, bright STIR signal mass RIGHT L4 vertebral body extending to the pedicle with mild acute fracture, less than 15% height loss superior endplate associated with Schmorl's node. Low T1, bright STIR signal RIGHT T12 facet consistent with metastasis. Ovoid subcentimeter low T1, bright STIR signal inferior to LEFT L2 pedicle, likely extra osseous cyst. Remaining vertebral bodies intact. Congenital canal narrowing on the basis of foreshortened pedicles. Moderate to severe L5-S1 disc height loss with mild chronic discogenic endplate changes. Mild disc desiccation L2-3 through L5-S1. CONUS MEDULLARIS AND CAUDA EQUINA: Conus medullaris terminates at L1-2 and demonstrates normal morphology and signal characteristics. Cauda equina is normal. PARASPINAL AND OTHER SOFT TISSUES: T2 bright cyst LEFT kidney. Mild bright interstitial STIR signal bilateral paraspinal muscles seen with low-grade strain. 9 mm round cystic lesion RIGHT paraspinal soft tissues fluid fluid level. At least 4.8 cm mass contiguous with RIGHT sacroiliac joint undermining the ileo psoas muscles. DISC LEVELS: L1-2, L2-3: No disc bulge, canal stenosis nor neural foraminal narrowing. Mild facet arthropathy. L3-4: RIGHT L4 osseous metastasis with a 9 x 7 mm tumoral extension to RIGHT lateral recess displacing the traversing RIGHT L4 nerve. Small broad-based disc bulge and mild facet arthropathy. Mild canal stenosis. Mild bilateral neural foraminal narrowing. L4-5: Small broad-based disc bulge, annular fissure. Mild facet arthropathy and ligamentum flavum redundancy. Moderate canal stenosis.  Moderate RIGHT, mild LEFT neural foraminal narrowing. L5-S1: Moderate broad-based disc bulge. Mild facet arthropathy and ligamentum flavum redundancy. No canal stenosis. Mild to moderate bilateral neural foraminal narrowing. IMPRESSION: 1. RIGHT L4 metastasis with mild pathologic fracture, tumor expansion into epidural space resulting in traversing RIGHT L4 nerve impingement. RIGHT T12 facet metastasis. 2. 2 subcentimeter cystic paraspinal muscle masses concerning for metastatic deposits. At least 4.8 cm RIGHT pelvic metastasis. 3. 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 Moderate canal stenosis L4-5, mild canal stenosis L3-4. 4. Neural foraminal narrowing L3-4 through L5-S1: Moderate on the RIGHT at L4-5. Electronically Signed   By: Elon Alas M.D.   On: 10/24/2017 01:31   US Renal  Result Date: 10/23/2017 CLINICAL DATA:  Acute renal injury, history of metastatic bladder carcinoma EXAM: RENAL / URINARY TRACT ULTRASOUND COMPLETE COMPARISON:  07/14/2017. FINDINGS: Right Kidney: Length: 11.7 cm. Echogenicity within normal limits. No mass or hydronephrosis visualized. Left Kidney: Length: 12.1 cm. Cysts are noted within the left kidney. The largest of these measures 9.1 cm in greatest dimension. These are stable when compared with prior CT examination. No hydronephrosis is noted. Bladder: Appears normal for degree of bladder distention. Note is made of known lesions within the right lobe of the liver. IMPRESSION: Left renal cystic change. Stable masses in the liver are noted. Electronically Signed   By: Inez Catalina M.D.   On: 10/23/2017 15:24     ASSESSMENT AND PLAN:   72 year old male with history of metastatic melanoma with liver, lung and bone involvement as well as history of bladder cancer presented to the ER with weakness and decreased urination.  1.  Acute kidney injury on chronic kidney disease stage IV in the setting of tobstructive  uropathy  Creatinine slowly improving  2.  Obstructive uropathy: Patient was evaluated by urology. Continue Flomax Patient will need Foley catheter in for at least 1  week and then may have voiding trial MRI did not show spinal cord compression  3.  Metastatic melanoma involving lungs, pelvis, liver and spine: Patient will have outpatient follow-up with Dr. Grayland Ormond upon discharge. Consultation with neurosurgery and radiation oncologist appreciated. Patient to start XRT for lumbar spine and SI joint Patient will need LSO brace when up greater then 45 degrees as per NEURO SURGERY  PT evaluation pending Given his BMI and other comorbidities he is not an ideal candidate for surgery with increased risk for complications and that if his cancer does progress, very difficult decisions about whether to intervene surgically will need to be made in the context of his overall prognosis.   4.  Essential hypertension: Continue Coreg   5  Diabetes: Continue glipizide, Levemir with ADA diet and sliding scale  6.  Hyperlipidemia on statin and omega-3  7.  Constipation:Resolved   Management plans discussed with the patient and wife and they are in agreement.  CODE STATUS:  full  TOTAL TIME TAKING CARE OF THIS PATIENT: 24 minutes.     POSSIBLE D/C  1-3 days, DEPENDING ON CLINICAL CONDITION.   Darrill Vreeland M.D on 10/25/2017 at 12:22 PM  Between 7am to 6pm - Pager - [phone] After 6pm go to [email or web address] - password [email or web address] [facility]  Office  [phone]  CC: Primary care physician; Sofie Hartigan, MD  Note: This dictation was prepared with Dragon dictation along with smaller phrase technology. Any transcriptional errors that result from this process are unintentional.

## 2017-10-26 LAB — BASIC METABOLIC PANEL
ANION GAP: 7 (ref 5–15)
BUN: 89 mg/dL — ABNORMAL HIGH (ref 6–20)
CO2: 21 mmol/L — AB (ref 22–32)
Calcium: 8.3 mg/dL — ABNORMAL LOW (ref 8.9–10.3)
Chloride: 107 mmol/L (ref 101–111)
Creatinine, Ser: 3.4 mg/dL — ABNORMAL HIGH (ref 0.61–1.24)
GFR, EST AFRICAN AMERICAN: 19 mL/min — AB (ref >60)
GFR, EST NON AFRICAN AMERICAN: 17 mL/min — AB (ref >60)
GLUCOSE: 149 mg/dL — AB (ref 65–99)
POTASSIUM: 4.8 mmol/L (ref 3.5–5.1)
Sodium: 135 mmol/L (ref 135–145)

## 2017-10-26 LAB — HEMOGLOBIN: HEMOGLOBIN: 8.3 g/dL — AB (ref 13.0–18.0)

## 2017-10-26 LAB — GLUCOSE, CAPILLARY
GLUCOSE-CAPILLARY: 218 mg/dL — AB (ref 65–99)
Glucose-Capillary: 117 mg/dL — ABNORMAL HIGH (ref 65–99)

## 2017-10-26 MED ORDER — TAMSULOSIN HCL 0.4 MG PO CAPS: 0.4000 mg | ORAL_CAPSULE | Freq: Every day | ORAL | 0 refills | Status: AC

## 2017-10-26 MED ORDER — HEPARIN SOD (PORK) LOCK FLUSH 100 UNIT/ML IV SOLN
INTRAVENOUS | Status: AC
  Administered 2017-10-26: 14:00:00 500 [IU]
  Filled 2017-10-26: qty 5

## 2017-10-26 MED ORDER — NEPRO/CARBSTEADY PO LIQD: 237.0000 mL | ORAL | 0 refills | Status: DC

## 2017-10-26 NOTE — Clinical Social Work Note (Signed)
CSW received referral for SNF.  Case discussed with case manager and plan is to discharge home with home health.  CSW to sign off please re-consult if social work needs arise.  Kamil Hanigan R. Judd Mccubbin, MSW, LCSWA [phone]  

## 2017-10-26 NOTE — Care Management (Signed)
Telephone call to [name], [place] for [facility]. Arranged for transport Lucianne Lei to pick-up Tyler Henderson next Tuesday. Wife updated Shelbie Ammons RN MSN CCM Care Management [phone]

## 2017-10-26 NOTE — Care Management Important Message (Signed)
Important Message  Patient Details  Name: Tyler Henderson MRN: [id number] Date of Birth: 1946-01-23   Medicare Important Message Given:  Yes    Juliann Pulse A Saleen Peden 10/26/2017, 11:02 AM

## 2017-10-26 NOTE — Care Management Note (Signed)
Case Management Note  Patient Details  Name: ERIS BRECK MRN: [id number] Date of Birth: [birth year]  Subjective/Objective:    Admitted to [facility] with the diagnosis of acute kidney injury. Lives with wife, Blanch Media [phone]). Last seen Dr. Dorann Ou 10/22/17. Prescriptions are filled at [facility] or [facility] in [place]. No home health. No skilled nursing. No home oxygen. Shower chair, cane, rollayor, crutches, and rolling walker in the home. Self feed, sometimes needs help with baths and dressing. Better appetite now. No falls. Foley in place x 1 week per urology.  Stimulation yesterday. To start radiation next Tuesday. Wife will transport Wife is apprehensive about transporting the  [place] for radiation. Will call [name] to assist.                 Action/Plan: Physical therapy evaluation completed. Recommending home with home health/physical therapy. Discussed Home Health agencies. [place]. Requested large bedside commode.   Expected Discharge Date:                  Expected Discharge Plan:     In-House Referral:   yes  Discharge planning Services   yes  Post Acute Care Choice:   yes Choice offered to:   wife and patient  DME Arranged:   yes DME Agency:   [facility] Arranged:   yes HH Agency:   [facility]   Status of Service:     If discussed at [place] of Stay Meetings, dates discussed:    Additional Comments:  Shelbie Ammons, RN MSN CCM Care Management [phone] 10/26/2017, 9:45 AM

## 2017-10-26 NOTE — Progress Notes (Signed)
Physical Therapy Treatment Patient Details Name: Tyler Henderson MRN: [id number] DOB: May 08, 1946 Today's Date: 10/26/2017    History of Present Illness Pt is a 72 y.o. male presenting to hospital with urinary retention and constipation.  Pt admitted to hospital with acute on chronic kidney disease, acute urinary retention with h/o metastatic bladder lesion, hyperkalemia, and hyponatremia.  MRI showing R L4 metastasis with mild pathologic fx, tumor expansion into epidural space resulting in traversing R L4 nerve impingement.  PMH includes metastatic melanoma to lungs/liver/bones, bladder CA, CKD, depression, htn, RLS, L TKR.    PT Comments    Pt presents with deficits in strength, transfers, mobility, gait, balance, and activity tolerance.  Pt required extra time and effort and cueing with bed mobility tasks and cues for sequencing during sit to/from stand transfer training from multiple height surfaces but no physical assistance.  Pt and spouse training on ascending/descendign stairs with one rail with demonstration and then verbal cues for sequencing with pt steady throughout.  Pt and spouse concerned with pt's future ability to safely ascend and descend stairs after he begins radiation therapy.  Education provided regarding possible options including grab bar on the R wall to go with the L rail and information regarding ramp options if that is more appropriate based on how pt tolerates radiation.  Pt will benefit from HHPT services upon discharge to safely address above deficits for decreased caregiver assistance and eventual return to PLOF.     Follow Up Recommendations  Home health PT;Supervision for mobility/OOB     Equipment Recommendations  Rolling walker with 5" wheels;Other (comment)(Pt states does have a RW, should be a bariatric walker)    Recommendations for Other Services       Precautions / Restrictions Precautions Precautions: Fall;Back Precaution Comments: Chemotherapy  Precautions; LSO Brace on when up, and when equal to, or above 45 degrees; R chest port Required Braces or Orthoses: Spinal Brace Spinal Brace: Lumbar corset Spinal Brace Comments: LSO on when up greater than or equal to 45 degrees Restrictions Weight Bearing Restrictions: No    Mobility  Bed Mobility Overal bed mobility: Needs Assistance Bed Mobility: Supine to Sit;Sit to Supine     Supine to sit: Supervision Sit to supine: Supervision   General bed mobility comments: Pt required vc's for logrolling; increased effort to perform from pt but no physical assist required  Transfers Overall transfer level: Needs assistance Equipment used: Rolling walker (2 wheeled) Transfers: Sit to/from [employer] Transfers Sit to Stand: Supervision         General transfer comment: Good eccentric and concentric control with good stability upon initial stand to/from multiple surfaces  Ambulation/Gait Ambulation/Gait assistance: Min guard Ambulation Distance (Feet): 30 Feet Assistive device: Rolling walker (2 wheeled) Gait Pattern/deviations: Step-through pattern;Decreased step length - right;Decreased step length - left   Gait velocity interpretation: Below normal speed for age/gender General Gait Details: Mild decreased stance time on RLE with no knee buckling noted; steady with RW   Stairs Stairs: Yes   Stair Management: One rail Left Number of Stairs: 1 step x 1 with bilateral rails and 3 steps x 1 with one rail on Left General stair comments: Pt uses BUEs on L rail during ascending/descending steps with good stability and control with min verbal cues for sequencing with pt's spouse present for training  Wheelchair Mobility    Modified Rankin (Stroke Patients Only)       Balance Overall balance assessment: Needs assistance Sitting-balance support: No upper extremity  supported;Feet supported Sitting balance-Leahy Scale: Good     Standing balance support: Bilateral  upper extremity supported Standing balance-Leahy Scale: Good                              Cognition Arousal/Alertness: Awake/alert Behavior During Therapy: WFL for tasks assessed/performed Overall Cognitive Status: Within Functional Limits for tasks assessed                                        Exercises Other Exercises Other Exercises: Bed mobility and transfer training with cues for sequencing Other Exercises: Lumbar brace donning    General Comments        Pertinent Vitals/Pain Pain Assessment: No/denies pain    Home Living                      Prior Function            PT Goals (current goals can now be found in the care plan section) Progress towards PT goals: Progressing toward goals    Frequency    Min 2X/week      PT Plan      Co-evaluation              AM-PAC PT "6 Clicks" Daily Activity  Outcome Measure                   End of Session Equipment Utilized During Treatment: Gait belt;Back brace Activity Tolerance: Patient tolerated treatment well Patient left: in bed;with bed alarm set;with family/visitor present;with call bell/phone within reach Nurse Communication: Mobility status PT Visit Diagnosis: Other abnormalities of gait and mobility (R26.89);Muscle weakness (generalized) (M62.81);Difficulty in walking, not elsewhere classified (R26.2)     Time: 1610-9604 PT Time Calculation (min) (ACUTE ONLY): 42 min  Charges:  $Gait Training: 23-37 mins $Therapeutic Activity: 8-22 mins                    G Codes:       D. [place] Jerrianne Hartin PT, DPT 10/26/17, 1:30 PM

## 2017-10-26 NOTE — Progress Notes (Signed)
[facility]  ROUNDING NOTE   Subjective:   Wife at bedside.   Objective:  Vital signs in last 24 hours:  Temp:  [97.8 F (36.6 C)-98.2 F (36.8 C)] 98.2 F (36.8 C) (04/04 0347) Pulse Rate:  [97-102] 102 (04/04 1313) Resp:  [18-32] 32 (04/04 0347) BP: (96-123)/(39-63) 104/39 (04/04 0347) SpO2:  [95 %-97 %] 95 % (04/04 1313)  Weight change:  Filed Weights   10/23/17 0954  Weight: (!) 147.9 kg (326 lb)    Intake/Output: I/O last 3 completed shifts: In: 299 [P.O.:720; NG/GT:237] Out: 1550 [Urine:1550]   Intake/Output this shift:  Total I/O In: 240 [P.O.:240] Out: -   Physical Exam: General: NAD, laying in bed  Head: Normocephalic, atraumatic. Moist oral mucosal membranes  Eyes: Anicteric, PERRL  Neck: Supple, trachea midline  Lungs:  Clear to auscultation  Heart: Regular rate and rhythm  Abdomen:  Soft, nontender  Extremities: 1+ peripheral edema.  Neurologic: Nonfocal, moving all four extremities  Skin: No lesions  GU: Foley with clear urine    Basic Metabolic Panel: Recent Labs  Lab 10/20/17 0909 10/23/17 1000 10/24/17 0448 10/25/17 0437 10/26/17 0450  NA 136 129* 134* 133* 135  K 4.4 5.4* 5.1 4.9 4.8  CL 103 99* 105 105 107  CO2 22 19* 18* 20* 21*  GLUCOSE 234* 211* 239* 315* 149*  BUN 49* 77* 78* 84* 89*  CREATININE 2.78* 4.79* 4.11* 3.55* 3.40*  CALCIUM 8.5* 7.8* 7.8* 8.1* 8.3*    Liver Function Tests: Recent Labs  Lab 10/20/17 0909 10/24/17 0448  AST 29 28  ALT 20 14*  ALKPHOS 251* 230*  BILITOT 0.4 0.6  PROT 6.6 6.1*  ALBUMIN 2.6* 2.3*   No results for input(s): LIPASE, AMYLASE in the last 168 hours. No results for input(s): AMMONIA in the last 168 hours.  CBC: Recent Labs  Lab 10/20/17 0909 10/23/17 1000 10/24/17 0448 10/25/17 0437 10/26/17 1056  WBC 7.9 10.6 8.3 13.7*  --   NEUTROABS 6.4  --   --   --   --   HGB 8.6* 8.7* 8.3* 7.8* 8.3*  HCT 26.5* 27.1* 25.8* 25.1*  --   MCV 84.7 84.7 83.3 83.4  --   PLT  371 299 266 315  --     Cardiac Enzymes: No results for input(s): CKTOTAL, CKMB, CKMBINDEX, TROPONINI in the last 168 hours.  BNP: Invalid input(s): POCBNP  CBG: Recent Labs  Lab 10/25/17 1705 10/25/17 2056 10/25/17 2308 10/26/17 0745 10/26/17 1145  GLUCAP 173* 229* 255* 117* 218*    Microbiology: Results for orders placed or performed in visit on 06/15/16  Microscopic Examination     Status: Abnormal   Collection Time: 06/15/16  9:12 AM  Result Value Ref Range Status   WBC, UA 0-5 0 - 5 /hpf Final   RBC, UA 3-10 (A) 0 - 2 /hpf Final   Epithelial Cells (non renal) 0-10 0 - 10 /hpf Final   Bacteria, UA None seen None seen/Few Final    Coagulation Studies: No results for input(s): LABPROT, INR in the last 72 hours.  Urinalysis: No results for input(s): COLORURINE, LABSPEC, PHURINE, GLUCOSEU, HGBUR, BILIRUBINUR, KETONESUR, PROTEINUR, UROBILINOGEN, NITRITE, LEUKOCYTESUR in the last 72 hours.  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 EXAM: LUMBAR SPINE - COMPLETE WITH BENDING VIEWS COMPARISON:  10/24/2017 lumbar spine MRI. FINDINGS: Five lumbar type  non-rib-bearing vertebral bodies. Straightening of lumbar lordosis without listhesis. No dynamic instability on flexion and extension views. Mild loss of L3-4 and L5-S1 intervertebral disc space height. Multilevel small anterior marginal osteophytes. Metastatic lesions within the L4 vertebral body and paraspinal muscles prior lumbar MRI poorly visualized. IMPRESSION: 1.  No acute fracture or dislocation identified. 2. Lumbar spondylosis with loss of disc space height at L3-4 and L5-S1. 3. No dynamic instability. Electronically Signed   By: Kristine Garbe M.D.   On: 10/24/2017 16:10     Medications:    . aspirin EC  81 mg Oral QPM  . atorvastatin  40 mg Oral q1800  . carvedilol  25 mg Oral BID WC   . enoxaparin (LOVENOX) injection  30 mg Subcutaneous Q24H  . feeding supplement (NEPRO CARB STEADY)  237 mL Oral Q24H  . ferrous sulfate  325 mg Oral Q breakfast  . FLUoxetine  10 mg Oral Daily  . gabapentin  300 mg Oral QPM  . glipiZIDE  10 mg Oral QAC breakfast  . insulin aspart  0-5 Units Subcutaneous QHS  . insulin aspart  0-9 Units Subcutaneous TID WC  . insulin detemir  10 Units Subcutaneous BID  . lidocaine-prilocaine   Topical Once  . megestrol  40 mg Oral Daily  . omega-3 acid ethyl esters  1 g Oral Daily  . pantoprazole  40 mg Oral Daily  . patiromer  8.4 g Oral BID  . senna-docusate  1 tablet Oral BID  . tamsulosin  0.4 mg Oral QPC supper   acetaminophen, ALPRAZolam, ondansetron **OR** ondansetron (ZOFRAN) IV, oxyCODONE, sodium chloride flush  Assessment/ Plan:  Mr. ARAM DOMZALSKI is a 72 y.o. white male with diabetes mellitus type 2, GERD, osteoarthritis, hypertension, hyperlipidemia, obstructive sleep apnea  1. Acute renal failure with hyponatremia, metabolic acidosis and hyperkalemia on chronic kidney disease stage IV with proteinuria. Baseline creatinine of 2.1, GFR of 30 on 10/16/17 Acute renal failure secondary to obstructive uropathy Chronic kidney disease secondary to metastatic melanoma, diabetes and hypertension - Appreciate urology input: indwelling catheter placed by Urology, Dr. Erlene Quan, on 4/1   LOS: [place] 4/4/20191:59 PM

## 2017-10-26 NOTE — Discharge Summary (Signed)
[facility] [place] NAME: Tyler Henderson    MR#:  [id number]  DATE OF BIRTH:  Oct 04, 1945  DATE OF ADMISSION:  10/23/2017 ADMITTING PHYSICIAN: Nicholes Mango, MD  DATE OF DISCHARGE: 10/26/2017  PRIMARY CARE PHYSICIAN: Sofie Hartigan, MD    ADMISSION DIAGNOSIS:  Dehydration [E86.0] Urinary retention [R33.9] AKI (acute kidney injury) (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 Anxiety   . Arthritis   . Bladder cancer ([place])   . Chronic kidney disease   . Depression   . Diabetes ([place])   . Dysrhythmia    atrial fib  . GERD (gastroesophageal reflux disease)   . Hypertension   . Restless leg syndrome   . Shortness of breath dyspnea    with exertion  . Skin cancer   . Sleep apnea    CPAP "once in awhile"  . 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  1.  Acute kidney injury on chronic kidney disease stage IV in the setting of tobstructive uropathy  Creatinine improving He will follow up with Dr Holley Raring after discahrge.  2.  Obstructive uropathy: Patient was evaluated by urology. He will continue Flomax Patient will need Foley catheter in for at least 1 week and then may have voiding trial MRI did not show spinal cord compression  3.  Metastatic melanoma involving lungs, pelvis, liver and spine: Patient will have outpatient follow-up with Dr. Grayland Ormond upon discharge. Consultation with neurosurgery and radiation oncologist appreciated. Patient to start XRT for  lumbar spine and SI joint Patient will need LSO brace when up greater then 45 degrees as per NEURO SURGERY  Given his BMI and other comorbidities he is not an ideal candidate for surgery with increased risk for complications and that if his cancer does progress, very difficult decisions about whether to intervene surgically will need to be made in the context of his overall prognosis.   4.  Essential hypertension: Continue Coreg   5  Diabetes: He will continue ADA diet with outpatient regimen   6.  Hyperlipidemia on statin and omega-3  7.  Constipation:Resolved      DISCHARGE CONDITIONS AND DIET:   Stable renal diabetic diet  CONSULTS OBTAINED:  Treatment Team:  Lavonia Dana, MD Irine Seal, MD Earlie Server, MD Kathlene November, MD  DRUG ALLERGIES:  No Known Allergies  DISCHARGE MEDICATIONS:   Allergies as of 10/26/2017   No Known Allergies     Medication List    STOP taking these medications   pioglitazone 45 MG tablet Commonly known as:  ACTOS     TAKE these medications   acetaminophen 650 MG CR tablet Commonly known as:  TYLENOL Take 650 mg by mouth every 12 (twelve) hours as needed for pain.   ALPRAZolam 0.5 MG tablet Commonly known as:  XANAX Take 1 tablet (0.5 mg total) by mouth at bedtime as needed for anxiety. What changed:  when to take this   aspirin EC 81 MG tablet Take 81 mg by  mouth every evening.   atorvastatin 40 MG tablet Commonly known as:  LIPITOR Take 40 mg by mouth in the evening   carvedilol 25 MG tablet Commonly known as:  COREG Take 25 mg by mouth twice daily   docusate sodium 100 MG capsule Commonly known as:  COLACE Take 1 capsule (100 mg total) by mouth 2 (two) times daily. What changed:    how much to take  when to take this  reasons to take this   feeding supplement (NEPRO CARB STEADY) Liqd Take 237 mLs by mouth daily.   FISH OIL PO Take 1 capsule by mouth 2 (two) times daily.   FLUoxetine 10 MG  capsule Commonly known as:  PROZAC Take 10 mg by mouth daily.   gabapentin 300 MG capsule Commonly known as:  NEURONTIN Take 300 mg by mouth every evening.   glipiZIDE 10 MG tablet Commonly known as:  GLUCOTROL Take 10 mg by mouth in the evening   lidocaine-prilocaine cream Commonly known as:  EMLA Apply to affected area once   lisinopril 40 MG tablet Commonly known as:  PRINIVIL,ZESTRIL Take 40 mg by mouth in the evening   megestrol 40 MG tablet Commonly known as:  MEGACE Take 1 tablet (40 mg total) by mouth daily.   MULTI-VITAMINS Tabs Take 1 tablet by mouth daily. Alive Mens Multivitamin   omeprazole 20 MG capsule Commonly known as:  PRILOSEC Take 20 mg by mouth in the evening   Oxycodone HCl 10 MG Tabs Take 1 tablet (10 mg total) by mouth 2 (two) times daily as needed. What changed:  reasons to take this   SLOW RELEASE IRON 160 (50 Fe) MG Tbcr SR tablet Generic drug:  ferrous sulfate Take 160 mg by mouth 2 (two) times daily.   tamsulosin 0.4 MG Caps capsule Commonly known as:  FLOMAX Take 1 capsule (0.4 mg total) by mouth daily after supper.   VELTASSA 8.4 g packet Generic drug:  patiromer Take 8.4 g by mouth 2 (two) times daily.            Durable Medical Equipment  (From admission, onward)        Start     Ordered   10/26/17 0959  For home use only DME Gilford Rile  [facility])  Once    Question:  Patient needs a walker to treat with the following condition  Answer:  Weakness   10/26/17 0959        Today   CHIEF COMPLAINT:  Patient feels weak    VITAL SIGNS:  Blood pressure (!) 104/39, pulse 100, temperature 98.2 F (36.8 C), temperature source Oral, resp. rate (!) 32, height 6\' 1"  (1.854 m), weight (!) 147.9 kg (326 lb), SpO2 97 %.   REVIEW OF SYSTEMS:  Review of Systems  Constitutional: Positive for malaise/fatigue. Negative for chills and fever.  HENT: Negative.  Negative for ear discharge, ear pain, hearing loss, nosebleeds and sore  throat.   Eyes: Negative.  Negative for blurred vision and pain.  Respiratory: Negative.  Negative for cough, hemoptysis, shortness of breath and wheezing.   Cardiovascular: Negative.  Negative for chest pain, palpitations and leg swelling.  Gastrointestinal: Negative.  Negative for abdominal pain, blood in stool, diarrhea, nausea and vomiting.  Genitourinary: Negative.  Negative for dysuria.  Musculoskeletal: Negative.  Negative for back pain.  Skin: Negative.   Neurological: Negative for dizziness, tremors, speech change, focal weakness, seizures and headaches.  Endo/Heme/Allergies: Negative.  Does not bruise/bleed easily.  Psychiatric/Behavioral:  Negative.  Negative for depression, hallucinations and suicidal ideas.     PHYSICAL EXAMINATION:  GENERAL:  72 y.o.-year-old patient lying in the bed with no acute distress.  obese NECK:  Supple, no jugular venous distention. No thyroid enlargement, no tenderness.  LUNGS: Normal breath sounds bilaterally, no wheezing, rales,rhonchi  No use of accessory muscles of respiration.  CARDIOVASCULAR: S1, S2 normal. No murmurs, rubs, or gallops.  ABDOMEN: Soft, non-tender, non-distended. Bowel sounds present. No organomegaly or mass.  EXTREMITIES: No pedal edema, cyanosis, or clubbing.  PSYCHIATRIC: The patient is alert and oriented x 3.  SKIN: No obvious rash, lesion, or ulcer.   DATA REVIEW:   CBC Recent Labs  Lab 10/25/17 0437  WBC 13.7*  HGB 7.8*  HCT 25.1*  PLT 315    Chemistries  Recent Labs  Lab 10/24/17 0448  10/26/17 0450  NA 134*   < > 135  K 5.1   < > 4.8  CL 105   < > 107  CO2 18*   < > 21*  GLUCOSE 239*   < > 149*  BUN 78*   < > 89*  CREATININE 4.11*   < > 3.40*  CALCIUM 7.8*   < > 8.3*  AST 28  --   --   ALT 14*  --   --   ALKPHOS 230*  --   --   BILITOT 0.6  --   --    < > = values in this interval not displayed.    Cardiac Enzymes No results for input(s): TROPONINI in the last 168 hours.  Microbiology  Results  @[id number] @  RADIOLOGY:  Dg Lumbar Spine Complete W/bend  Result Date: 10/24/2017 CLINICAL DATA:  72 y/o  M; metastatic melanoma to lumbar spine. EXAM: LUMBAR SPINE - COMPLETE WITH BENDING VIEWS COMPARISON:  10/24/2017 lumbar spine MRI. FINDINGS: Five lumbar type non-rib-bearing vertebral bodies. Straightening of lumbar lordosis without listhesis. No dynamic instability on flexion and extension views. Mild loss of L3-4 and L5-S1 intervertebral disc space height. Multilevel small anterior marginal osteophytes. Metastatic lesions within the L4 vertebral body and paraspinal muscles prior lumbar MRI poorly visualized. IMPRESSION: 1.  No acute fracture or dislocation identified. 2. Lumbar spondylosis with loss of disc space height at L3-4 and L5-S1. 3. No dynamic instability. Electronically Signed   By: Kristine Garbe M.D.   On: 10/24/2017 16:10      Allergies as of 10/26/2017   No Known Allergies     Medication List    STOP taking these medications   pioglitazone 45 MG tablet Commonly known as:  ACTOS     TAKE these medications   acetaminophen 650 MG CR tablet Commonly known as:  TYLENOL Take 650 mg by mouth every 12 (twelve) hours as needed for pain.   ALPRAZolam 0.5 MG tablet Commonly known as:  XANAX Take 1 tablet (0.5 mg total) by mouth at bedtime as needed for anxiety. What changed:  when to take this   aspirin EC 81 MG tablet Take 81 mg by mouth every evening.   atorvastatin 40 MG tablet Commonly known as:  LIPITOR Take 40 mg by mouth in the evening   carvedilol 25 MG tablet Commonly known as:  COREG Take 25 mg by mouth twice daily   docusate sodium 100 MG capsule Commonly known as:  COLACE Take 1 capsule (100 mg total) by mouth 2 (two) times daily. What changed:    how much to take  when to take this  reasons to take this   feeding supplement (NEPRO CARB STEADY) Liqd Take 237 mLs by mouth daily.   FISH OIL PO Take 1 capsule by mouth 2  (two) times daily.   FLUoxetine 10 MG capsule Commonly known as:  PROZAC Take 10 mg by mouth daily.   gabapentin 300 MG capsule Commonly known as:  NEURONTIN Take 300 mg by mouth every evening.   glipiZIDE 10 MG tablet Commonly known as:  GLUCOTROL Take 10 mg by mouth in the evening   lidocaine-prilocaine cream Commonly known as:  EMLA Apply to affected area once   lisinopril 40 MG tablet Commonly known as:  PRINIVIL,ZESTRIL Take 40 mg by mouth in the evening   megestrol 40 MG tablet Commonly known as:  MEGACE Take 1 tablet (40 mg total) by mouth daily.   MULTI-VITAMINS Tabs Take 1 tablet by mouth daily. Alive Mens Multivitamin   omeprazole 20 MG capsule Commonly known as:  PRILOSEC Take 20 mg by mouth in the evening   Oxycodone HCl 10 MG Tabs Take 1 tablet (10 mg total) by mouth 2 (two) times daily as needed. What changed:  reasons to take this   SLOW RELEASE IRON 160 (50 Fe) MG Tbcr SR tablet Generic drug:  ferrous sulfate Take 160 mg by mouth 2 (two) times daily.   tamsulosin 0.4 MG Caps capsule Commonly known as:  FLOMAX Take 1 capsule (0.4 mg total) by mouth daily after supper.   VELTASSA 8.4 g packet Generic drug:  patiromer Take 8.4 g by mouth 2 (two) times daily.            Durable Medical Equipment  (From admission, onward)        Start     Ordered   10/26/17 0959  For home use only DME Gilford Rile  [facility])  Once    Question:  Patient needs a walker to treat with the following condition  Answer:  Weakness   10/26/17 0959         Management plans discussed with the patient and he is in agreement. Stable for discharge   Patient should follow up with pcp   CODE STATUS:     Code Status Orders  (From admission, onward)        Start     Ordered   10/23/17 1546  Full code  Continuous     10/23/17 1545    Code Status History    This patient has a current code status but no historical code status.    Advance Directive Documentation      Most Recent Value  Type of Advance Directive  Healthcare Power of Attorney  Pre-existing out of facility DNR order (yellow form or pink MOST form)  -  "MOST" Form in Place?  -      TOTAL TIME TAKING CARE OF THIS PATIENT: 38 minutes.    Note: This dictation was prepared with Dragon dictation along with smaller phrase technology. Any transcriptional errors that result from this process are unintentional.  Areen Trautner M.D on 10/26/2017 at 10:01 AM  Between 7am to 6pm - Pager - [phone] After 6pm go to [email or web address] - password [email or web address] [facility]  Office  [phone]  CC: Primary care physician; Sofie Hartigan, MD

## 2017-10-26 NOTE — Progress Notes (Signed)
Discharge instructions given and went over with patients wife at bedside. Prescriptions given and reviewed. Follow-up appointments reviewed. Patient discharged home with home health and equipment. Wife verbalized understanding. Patient discharged home with wife via wheelchair by nursing staff. Madlyn Frankel, RN

## 2017-10-27 ENCOUNTER — Inpatient Hospital Stay: Payer: Medicare Other | Attending: Oncology | Admitting: Oncology

## 2017-10-27 ENCOUNTER — Inpatient Hospital Stay: Payer: Medicare Other

## 2017-10-27 ENCOUNTER — Encounter: Payer: Self-pay | Admitting: Oncology

## 2017-10-27 ENCOUNTER — Telehealth: Payer: Self-pay

## 2017-10-27 VITALS — BP 79/44 | HR 96 | Temp 97.8°F

## 2017-10-27 DIAGNOSIS — R739 Hyperglycemia, unspecified: Secondary | ICD-10-CM | POA: Diagnosis not present

## 2017-10-27 DIAGNOSIS — R52 Pain, unspecified: Secondary | ICD-10-CM | POA: Insufficient documentation

## 2017-10-27 DIAGNOSIS — Z5112 Encounter for antineoplastic immunotherapy: Secondary | ICD-10-CM | POA: Insufficient documentation

## 2017-10-27 DIAGNOSIS — I959 Hypotension, unspecified: Secondary | ICD-10-CM | POA: Diagnosis not present

## 2017-10-27 DIAGNOSIS — C7800 Secondary malignant neoplasm of unspecified lung: Secondary | ICD-10-CM | POA: Insufficient documentation

## 2017-10-27 DIAGNOSIS — C787 Secondary malignant neoplasm of liver and intrahepatic bile duct: Secondary | ICD-10-CM | POA: Insufficient documentation

## 2017-10-27 DIAGNOSIS — C439 Malignant melanoma of skin, unspecified: Secondary | ICD-10-CM | POA: Diagnosis not present

## 2017-10-27 DIAGNOSIS — C799 Secondary malignant neoplasm of unspecified site: Secondary | ICD-10-CM

## 2017-10-27 DIAGNOSIS — N179 Acute kidney failure, unspecified: Secondary | ICD-10-CM | POA: Diagnosis not present

## 2017-10-27 DIAGNOSIS — F419 Anxiety disorder, unspecified: Secondary | ICD-10-CM | POA: Insufficient documentation

## 2017-10-27 DIAGNOSIS — R339 Retention of urine, unspecified: Secondary | ICD-10-CM | POA: Insufficient documentation

## 2017-10-27 DIAGNOSIS — C7951 Secondary malignant neoplasm of bone: Secondary | ICD-10-CM | POA: Insufficient documentation

## 2017-10-27 LAB — COMPREHENSIVE METABOLIC PANEL
ALK PHOS: 200 U/L — AB (ref 38–126)
ALT: 20 U/L (ref 17–63)
AST: 26 U/L (ref 15–41)
Albumin: 2.1 g/dL — ABNORMAL LOW (ref 3.5–5.0)
Anion gap: 10 (ref 5–15)
BUN: 93 mg/dL — AB (ref 6–20)
CALCIUM: 8 mg/dL — AB (ref 8.9–10.3)
CO2: 18 mmol/L — AB (ref 22–32)
CREATININE: 3.1 mg/dL — AB (ref 0.61–1.24)
Chloride: 102 mmol/L (ref 101–111)
GFR calc Af Amer: 21 mL/min — ABNORMAL LOW (ref >60)
GFR, EST NON AFRICAN AMERICAN: 18 mL/min — AB (ref >60)
Glucose, Bld: 307 mg/dL — ABNORMAL HIGH (ref 65–99)
Potassium: 4.9 mmol/L (ref 3.5–5.1)
SODIUM: 130 mmol/L — AB (ref 135–145)
Total Bilirubin: 0.3 mg/dL (ref 0.3–1.2)
Total Protein: 5.9 g/dL — ABNORMAL LOW (ref 6.5–8.1)

## 2017-10-27 LAB — CBC WITH DIFFERENTIAL/PLATELET
BASOS PCT: 0 %
Basophils Absolute: 0 10*3/uL (ref 0–0.1)
EOS ABS: 0 10*3/uL (ref 0–0.7)
EOS PCT: 0 %
HCT: 24.2 % — ABNORMAL LOW (ref 40.0–52.0)
Hemoglobin: 7.8 g/dL — ABNORMAL LOW (ref 13.0–18.0)
Lymphocytes Relative: 8 %
Lymphs Abs: 0.6 10*3/uL — ABNORMAL LOW (ref 1.0–3.6)
MCH: 26.8 pg (ref 26.0–34.0)
MCHC: 32.1 g/dL (ref 32.0–36.0)
MCV: 83.7 fL (ref 80.0–100.0)
MONOS PCT: 15 %
Monocytes Absolute: 1.2 10*3/uL — ABNORMAL HIGH (ref 0.2–1.0)
Neutro Abs: 5.8 10*3/uL (ref 1.4–6.5)
Neutrophils Relative %: 77 %
PLATELETS: 247 10*3/uL (ref 150–440)
RBC: 2.89 MIL/uL — ABNORMAL LOW (ref 4.40–5.90)
RDW: 14 % (ref 11.5–14.5)
WBC: 7.6 10*3/uL (ref 3.8–10.6)

## 2017-10-27 MED ORDER — SODIUM CHLORIDE 0.9% FLUSH
10.0000 mL | INTRAVENOUS | Status: DC | PRN
  Administered 2017-10-27: 10 mL via INTRAVENOUS
  Filled 2017-10-27: qty 10

## 2017-10-27 MED ORDER — HEPARIN SOD (PORK) LOCK FLUSH 100 UNIT/ML IV SOLN
INTRAVENOUS | Status: AC
  Filled 2017-10-27: qty 5

## 2017-10-27 MED ORDER — SODIUM CHLORIDE 0.9 % IV SOLN
Freq: Once | INTRAVENOUS | Status: AC
  Administered 2017-10-27: 10:00:00 via INTRAVENOUS
  Filled 2017-10-27: qty 1000

## 2017-10-27 MED ORDER — HEPARIN SOD (PORK) LOCK FLUSH 100 UNIT/ML IV SOLN
500.0000 [IU] | Freq: Once | INTRAVENOUS | Status: AC
  Administered 2017-10-27: 500 [IU] via INTRAVENOUS

## 2017-10-27 NOTE — Progress Notes (Signed)
Instructed wife to hold lisinopril until patient sees Dr Holley Raring next Weds d/t  Hypotension 79/44 this am in clinic. To continue taking Coreg. Wife concerned about high blood sugars as his Actos is on hold. He is taking glipizide daily. Dr Grayland Ormond told patient to restart actos as BS this am 300.

## 2017-10-27 NOTE — Telephone Encounter (Signed)
Tyler Henderson with [facility] called stating pt has several appts on Wednesday when he is supposed to come to [facility] for V&[facility] and requested home health remove foley. Ensured that home health can replace foley if needed. Tyler Henderson stated that she could replace the foley. Tyler Henderson then stated that she will remove foley prior to nephrology appt and will replace if needed later on in the day.

## 2017-10-27 NOTE — Progress Notes (Signed)
Patient here today for follow up after receiving first chemotherapy treatment with Curt Bears and [facility] last week. Patient discharged from the hospital yesterday.

## 2017-10-29 ENCOUNTER — Other Ambulatory Visit: Payer: Self-pay

## 2017-10-29 ENCOUNTER — Emergency Department: Payer: Medicare Other

## 2017-10-29 ENCOUNTER — Inpatient Hospital Stay
Admission: EM | Admit: 2017-10-29 | Discharge: 2017-11-03 | DRG: 683 | Disposition: A | Discharge status: Skilled Nursing Facility | Payer: Medicare Other | Attending: Internal Medicine | Admitting: Internal Medicine

## 2017-10-29 DIAGNOSIS — N179 Acute kidney failure, unspecified: Principal | ICD-10-CM | POA: Diagnosis present

## 2017-10-29 DIAGNOSIS — R339 Retention of urine, unspecified: Secondary | ICD-10-CM | POA: Diagnosis present

## 2017-10-29 DIAGNOSIS — C799 Secondary malignant neoplasm of unspecified site: Secondary | ICD-10-CM

## 2017-10-29 DIAGNOSIS — C7800 Secondary malignant neoplasm of unspecified lung: Secondary | ICD-10-CM | POA: Diagnosis not present

## 2017-10-29 DIAGNOSIS — E860 Dehydration: Secondary | ICD-10-CM | POA: Diagnosis present

## 2017-10-29 DIAGNOSIS — Z7982 Long term (current) use of aspirin: Secondary | ICD-10-CM

## 2017-10-29 DIAGNOSIS — F419 Anxiety disorder, unspecified: Secondary | ICD-10-CM | POA: Diagnosis present

## 2017-10-29 DIAGNOSIS — E875 Hyperkalemia: Secondary | ICD-10-CM | POA: Diagnosis present

## 2017-10-29 DIAGNOSIS — K5900 Constipation, unspecified: Secondary | ICD-10-CM

## 2017-10-29 DIAGNOSIS — G4733 Obstructive sleep apnea (adult) (pediatric): Secondary | ICD-10-CM | POA: Diagnosis present

## 2017-10-29 DIAGNOSIS — C787 Secondary malignant neoplasm of liver and intrahepatic bile duct: Secondary | ICD-10-CM | POA: Diagnosis not present

## 2017-10-29 DIAGNOSIS — Z87891 Personal history of nicotine dependence: Secondary | ICD-10-CM

## 2017-10-29 DIAGNOSIS — Z96652 Presence of left artificial knee joint: Secondary | ICD-10-CM | POA: Diagnosis not present

## 2017-10-29 DIAGNOSIS — C439 Malignant melanoma of skin, unspecified: Secondary | ICD-10-CM | POA: Diagnosis present

## 2017-10-29 DIAGNOSIS — K219 Gastro-esophageal reflux disease without esophagitis: Secondary | ICD-10-CM | POA: Diagnosis present

## 2017-10-29 DIAGNOSIS — E1122 Type 2 diabetes mellitus with diabetic chronic kidney disease: Secondary | ICD-10-CM | POA: Diagnosis not present

## 2017-10-29 DIAGNOSIS — N183 Chronic kidney disease, stage 3 (moderate): Secondary | ICD-10-CM | POA: Diagnosis present

## 2017-10-29 DIAGNOSIS — Z923 Personal history of irradiation: Secondary | ICD-10-CM

## 2017-10-29 DIAGNOSIS — E1121 Type 2 diabetes mellitus with diabetic nephropathy: Secondary | ICD-10-CM | POA: Diagnosis present

## 2017-10-29 DIAGNOSIS — Z515 Encounter for palliative care: Secondary | ICD-10-CM | POA: Diagnosis not present

## 2017-10-29 DIAGNOSIS — F329 Major depressive disorder, single episode, unspecified: Secondary | ICD-10-CM | POA: Diagnosis present

## 2017-10-29 DIAGNOSIS — G893 Neoplasm related pain (acute) (chronic): Secondary | ICD-10-CM | POA: Diagnosis present

## 2017-10-29 DIAGNOSIS — R531 Weakness: Secondary | ICD-10-CM

## 2017-10-29 DIAGNOSIS — G629 Polyneuropathy, unspecified: Secondary | ICD-10-CM | POA: Diagnosis present

## 2017-10-29 DIAGNOSIS — Z9221 Personal history of antineoplastic chemotherapy: Secondary | ICD-10-CM

## 2017-10-29 DIAGNOSIS — Z79899 Other long term (current) drug therapy: Secondary | ICD-10-CM

## 2017-10-29 DIAGNOSIS — I129 Hypertensive chronic kidney disease with stage 1 through stage 4 chronic kidney disease, or unspecified chronic kidney disease: Secondary | ICD-10-CM | POA: Diagnosis present

## 2017-10-29 DIAGNOSIS — C7951 Secondary malignant neoplasm of bone: Secondary | ICD-10-CM | POA: Diagnosis present

## 2017-10-29 DIAGNOSIS — G2581 Restless legs syndrome: Secondary | ICD-10-CM | POA: Diagnosis present

## 2017-10-29 DIAGNOSIS — Z8551 Personal history of malignant neoplasm of bladder: Secondary | ICD-10-CM

## 2017-10-29 DIAGNOSIS — Z66 Do not resuscitate: Secondary | ICD-10-CM | POA: Diagnosis not present

## 2017-10-29 LAB — COMPREHENSIVE METABOLIC PANEL
ALT: 23 U/L (ref 17–63)
AST: 22 U/L (ref 15–41)
Albumin: 2.1 g/dL — ABNORMAL LOW (ref 3.5–5.0)
Alkaline Phosphatase: 188 U/L — ABNORMAL HIGH (ref 38–126)
Anion gap: 7 (ref 5–15)
BUN: 99 mg/dL — ABNORMAL HIGH (ref 6–20)
CALCIUM: 8.5 mg/dL — AB (ref 8.9–10.3)
CO2: 19 mmol/L — ABNORMAL LOW (ref 22–32)
CREATININE: 3.37 mg/dL — AB (ref 0.61–1.24)
Chloride: 105 mmol/L (ref 101–111)
GFR, EST AFRICAN AMERICAN: 19 mL/min — AB (ref >60)
GFR, EST NON AFRICAN AMERICAN: 17 mL/min — AB (ref >60)
Glucose, Bld: 241 mg/dL — ABNORMAL HIGH (ref 65–99)
Potassium: 4.9 mmol/L (ref 3.5–5.1)
Sodium: 131 mmol/L — ABNORMAL LOW (ref 135–145)
Total Bilirubin: 0.5 mg/dL (ref 0.3–1.2)
Total Protein: 5.8 g/dL — ABNORMAL LOW (ref 6.5–8.1)

## 2017-10-29 LAB — URINALYSIS, COMPLETE (UACMP) WITH MICROSCOPIC
BACTERIA UA: NONE SEEN
BILIRUBIN URINE: NEGATIVE
GLUCOSE, UA: NEGATIVE mg/dL
Ketones, ur: NEGATIVE mg/dL
Nitrite: NEGATIVE
PROTEIN: 30 mg/dL — AB
Specific Gravity, Urine: 1.009 (ref 1.005–1.030)
pH: 5 (ref 5.0–8.0)

## 2017-10-29 LAB — CBC
HEMATOCRIT: 25.5 % — AB (ref 40.0–52.0)
HEMOGLOBIN: 8.1 g/dL — AB (ref 13.0–18.0)
MCH: 26.3 pg (ref 26.0–34.0)
MCHC: 31.7 g/dL — AB (ref 32.0–36.0)
MCV: 83 fL (ref 80.0–100.0)
Platelets: 252 10*3/uL (ref 150–440)
RBC: 3.07 MIL/uL — AB (ref 4.40–5.90)
RDW: 14.2 % (ref 11.5–14.5)
WBC: 8.6 10*3/uL (ref 3.8–10.6)

## 2017-10-29 LAB — GLUCOSE, CAPILLARY
Glucose-Capillary: 149 mg/dL — ABNORMAL HIGH (ref 65–99)
Glucose-Capillary: 189 mg/dL — ABNORMAL HIGH (ref 65–99)

## 2017-10-29 MED ORDER — DOCUSATE SODIUM 100 MG PO CAPS
100.0000 mg | ORAL_CAPSULE | Freq: Two times a day (BID) | ORAL | Status: DC
  Administered 2017-10-29 – 2017-10-30 (×2): 100 mg via ORAL
  Filled 2017-10-29 (×2): qty 1

## 2017-10-29 MED ORDER — SODIUM CHLORIDE 0.9 % IV SOLN: 1000.0000 mL | Freq: Once | INTRAVENOUS | Status: DC

## 2017-10-29 MED ORDER — ACETAMINOPHEN 325 MG PO TABS
650.0000 mg | ORAL_TABLET | Freq: Four times a day (QID) | ORAL | Status: DC | PRN
  Administered 2017-10-29 – 2017-10-30 (×2): 650 mg via ORAL
  Filled 2017-10-29 (×2): qty 2

## 2017-10-29 MED ORDER — ONDANSETRON HCL 4 MG/2ML IJ SOLN
4.0000 mg | Freq: Four times a day (QID) | INTRAMUSCULAR | Status: DC | PRN
  Administered 2017-10-31: 4 mg via INTRAVENOUS
  Filled 2017-10-29 (×3): qty 2

## 2017-10-29 MED ORDER — ONDANSETRON HCL 4 MG PO TABS: 4.0000 mg | ORAL_TABLET | Freq: Four times a day (QID) | ORAL | Status: DC | PRN

## 2017-10-29 MED ORDER — IPRATROPIUM-ALBUTEROL 0.5-2.5 (3) MG/3ML IN SOLN
3.0000 mL | Freq: Once | RESPIRATORY_TRACT | Status: AC
  Administered 2017-10-29: 3 mL via RESPIRATORY_TRACT
  Filled 2017-10-29: qty 3

## 2017-10-29 MED ORDER — HEPARIN SODIUM (PORCINE) 5000 UNIT/ML IJ SOLN
5000.0000 [IU] | Freq: Three times a day (TID) | INTRAMUSCULAR | Status: DC
  Administered 2017-10-29 – 2017-11-03 (×15): 5000 [IU] via SUBCUTANEOUS
  Filled 2017-10-29 (×15): qty 1

## 2017-10-29 MED ORDER — SODIUM CHLORIDE 0.9 % IV SOLN
1000.0000 mL | Freq: Once | INTRAVENOUS | Status: AC
  Administered 2017-10-29: 1000 mL via INTRAVENOUS

## 2017-10-29 MED ORDER — ACETAMINOPHEN 650 MG RE SUPP: 650.0000 mg | Freq: Four times a day (QID) | RECTAL | Status: DC | PRN

## 2017-10-29 MED ORDER — POLYETHYLENE GLYCOL 3350 17 G PO PACK
17.0000 g | PACK | Freq: Every day | ORAL | Status: DC | PRN
  Administered 2017-10-30 (×2): 17 g via ORAL
  Filled 2017-10-29 (×2): qty 1

## 2017-10-29 MED ORDER — INSULIN ASPART 100 UNIT/ML ~~LOC~~ SOLN
0.0000 [IU] | Freq: Three times a day (TID) | SUBCUTANEOUS | Status: DC
  Administered 2017-10-29 – 2017-10-30 (×4): 2 [IU] via SUBCUTANEOUS
  Administered 2017-10-31: 3 [IU] via SUBCUTANEOUS
  Administered 2017-10-31 – 2017-11-01 (×5): 2 [IU] via SUBCUTANEOUS
  Administered 2017-11-02: 09:00:00 1 [IU] via SUBCUTANEOUS
  Administered 2017-11-02 (×2): 2 [IU] via SUBCUTANEOUS
  Administered 2017-11-03 (×2): 1 [IU] via SUBCUTANEOUS
  Filled 2017-10-29 (×15): qty 1

## 2017-10-29 MED ORDER — SODIUM CHLORIDE 0.9 % IV SOLN
Freq: Once | INTRAVENOUS | Status: AC
  Administered 2017-10-29: 17:00:00 via INTRAVENOUS

## 2017-10-29 NOTE — H&P (Signed)
[facility] [place] NAME: Tyler Henderson    MR#:  [id number]  DATE OF BIRTH:  Nov 25, 1945  DATE OF ADMISSION:  10/29/2017  PRIMARY CARE PHYSICIAN: Sofie Hartigan, MD   REQUESTING/REFERRING PHYSICIAN: Dr Corky Downs  CHIEF COMPLAINT:  increasing weakness at home  HISTORY OF PRESENT ILLNESS:  Denim Kalmbach  is a 72 y.o. male with a known history of static bladder cancer undergoing radiation therapy, diabetes, CKD stage IV, hypertension, sleep apnea comes to the emergency room with increasing weakness which have been progressively worse. Patient's wife gives most of the history-patient was just discharge 4/4/ 2019 with similar symptoms of acute on chronic renal failure and weakness with home health. Home health came down the next day and did their evaluation. Patient has not been seen thereafter since it's a weekend. Wife is frustrated since patient is not able to move, poor appetite and not able to eat and do his usual ADLs. 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 Urine in the Foley looks clear In the emergency room patient is alert oriented times three. He is hemodynamically stable. His labs are at baseline. Pt is being admitted for progressive weakness.   PAST MEDICAL HISTORY:   Past Medical History:  Diagnosis Date  . Anxiety   . Arthritis   . Bladder cancer ([place])   . Chronic kidney disease   . Depression   . Diabetes ([place])   . Dysrhythmia    atrial fib  . GERD (gastroesophageal reflux disease)   . Hypertension   . Restless leg syndrome   . Shortness of breath dyspnea    with exertion  . Skin cancer   . Sleep apnea    CPAP "once in awhile"  . 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 APPENDECTOMY    . JOINT REPLACEMENT Left    Total Knee Replacement  . KNEE ARTHROSCOPY Left   . PORTA CATH INSERTION N/A  10/19/2017   Procedure: PORTA CATH INSERTION;  Surgeon: Algernon Huxley, MD;  Location: [place] [facility];  Service: Cardiovascular;  Laterality: N/A;  . REPLACEMENT TOTAL KNEE Left 2012   Dr. [facility]ikawa, Jr. [facility]  . TRANSURETHRAL RESECTION OF BLADDER TUMOR N/A 03/14/2016   Procedure: TRANSURETHRAL RESECTION OF BLADDER TUMOR (TURBT);  Surgeon: Hollice Espy, MD;  Location: [facility];  Service: Urology;  Laterality: N/A;    SOCIAL HISTORY:   Social History   Tobacco Use  . Smoking status: Former Smoker    Packs/day: 1.00    Years: 20.00    Pack years: 20.00    Types: Cigarettes    Last attempt to quit: 1979    Years since quitting: 40.2  . Smokeless tobacco: Never Used  Substance Use Topics  . Alcohol use: Yes    Alcohol/week: 1.2 oz    Types: 2 Cans of beer per week    Comment: occassional    FAMILY HISTORY:   Family History  Problem Relation Age of Onset  . Cancer Mother        Breast  . Heart failure Father   . Prostate cancer Neg Hx   . Bladder Cancer Neg Hx     DRUG ALLERGIES:  No Known Allergies  REVIEW OF SYSTEMS:  Review of Systems  Constitutional: Positive for malaise/fatigue. Negative for chills, fever and weight loss.  HENT: Negative for  ear discharge, ear pain and nosebleeds.   Eyes: Negative for blurred vision, pain and discharge.  Respiratory: Negative for sputum production, shortness of breath, wheezing and stridor.   Cardiovascular: Negative for chest pain, palpitations, orthopnea and PND.  Gastrointestinal: Negative for abdominal pain, diarrhea, nausea and vomiting.  Genitourinary: Negative for frequency and urgency.  Musculoskeletal: Positive for back pain. Negative for joint pain.  Neurological: Positive for weakness. Negative for sensory change, speech change and focal weakness.  Psychiatric/Behavioral: Negative for depression and hallucinations. The patient is not nervous/anxious.      MEDICATIONS AT HOME:   Prior to Admission medications    Medication Sig Start Date End Date Taking? Authorizing Provider  acetaminophen (TYLENOL) 650 MG CR tablet Take 650 mg by mouth every 12 (twelve) hours as needed for pain.   Yes [provider]  ALPRAZolam Duanne Moron) 0.5 MG tablet Take 1 tablet (0.5 mg total) by mouth at bedtime as needed for anxiety. 10/02/17  Yes Lloyd Huger, MD  aspirin EC 81 MG tablet Take 81 mg by mouth every evening.    Yes [provider]  atorvastatin (LIPITOR) 40 MG tablet Take 40 mg by mouth in the evening 03/23/15  Yes [provider]  carvedilol (COREG) 25 MG tablet Take 25 mg by mouth twice daily 06/26/15  Yes [provider]  docusate sodium (COLACE) 100 MG capsule Take 1 capsule (100 mg total) by mouth 2 (two) times daily. Patient taking differently: Take 100-200 mg by mouth daily as needed for moderate constipation.  03/14/16  Yes Hollice Espy, MD  ferrous sulfate (SLOW RELEASE IRON) 160 (50 Fe) MG TBCR SR tablet Take 160 mg by mouth 2 (two) times daily.    Yes [provider]  FLUoxetine (PROZAC) 10 MG capsule Take 10 mg by mouth daily.  10/14/17  Yes [provider]  gabapentin (NEURONTIN) 300 MG capsule Take 300 mg by mouth every evening.    Yes [provider]  glipiZIDE (GLUCOTROL) 10 MG tablet Take 10 mg by mouth in the evening 03/23/15  Yes [provider]  lidocaine-prilocaine (EMLA) cream Apply to affected area once 10/15/17  Yes Finnegan, Kathlene November, MD  lisinopril (PRINIVIL,ZESTRIL) 40 MG tablet Take 40 mg by mouth in the evening 06/25/15  Yes [provider]  megestrol (MEGACE) 40 MG tablet Take 1 tablet (40 mg total) by mouth daily. 10/13/17  Yes Lloyd Huger, MD  Multiple Vitamin (MULTI-VITAMINS) TABS Take 1 tablet by mouth daily. Alive Mens Multivitamin   Yes [provider]  Omega-3 Fatty Acids (FISH OIL PO) Take 1 capsule by mouth 2 (two) times daily.    Yes [provider]  omeprazole (PRILOSEC)  20 MG capsule Take 20 mg by mouth in the evening 03/23/15  Yes [provider]  Oxycodone HCl 10 MG TABS Take 1 tablet (10 mg total) by mouth 2 (two) times daily as needed. Patient taking differently: Take 10 mg by mouth 2 (two) times daily as needed (for pain).  10/13/17  Yes Lloyd Huger, MD  patiromer (VELTASSA) 8.4 g packet Take 8.4 g by mouth 2 (two) times daily.   Yes [provider]  tamsulosin (FLOMAX) 0.4 MG CAPS capsule Take 1 capsule (0.4 mg total) by mouth daily after supper. 10/26/17  Yes Bettey Costa, MD  Nutritional Supplements (FEEDING SUPPLEMENT, NEPRO CARB STEADY,) LIQD Take 237 mLs by mouth daily. 10/26/17 11/25/17  Bettey Costa, MD      VITAL SIGNS:  Blood pressure Marland Kitchen)  129/57, pulse 100, temperature 98.1 F (36.7 C), temperature source Oral, resp. rate 20, height 6\' 1"  (1.854 m), weight (!) 148.3 kg (327 lb), SpO2 99 %.  PHYSICAL EXAMINATION:  GENERAL:  72 y.o.-year-old patient lying in the bed with no acute distress.  EYES: Pupils equal, round, reactive to light and accommodation. No scleral icterus. Extraocular muscles intact.  HEENT: Head atraumatic, normocephalic. Oropharynx and nasopharynx clear.  NECK:  Supple, no jugular venous distention. No thyroid enlargement, no tenderness.  LUNGS: Normal breath sounds bilaterally, no wheezing, rales,rhonchi or crepitation. No use of accessory muscles of respiration.  CARDIOVASCULAR: S1, S2 normal. No murmurs, rubs, or gallops.  ABDOMEN: Soft, nontender, nondistended. Bowel sounds present. No organomegaly or mass.  EXTREMITIES: No pedal edema, cyanosis, or clubbing.  NEUROLOGIC: Cranial nerves II through XII are intact. Muscle strength 5/5 in all extremities. Sensation intact. Gait not checked.  PSYCHIATRIC: The patient is alert and oriented x 3.  SKIN: No obvious rash, lesion, or ulcer.   LABORATORY PANEL:   CBC Recent Labs  Lab 10/29/17 1218  WBC 8.6  HGB 8.1*  HCT 25.5*  PLT 252    ------------------------------------------------------------------------------------------------------------------  Chemistries  Recent Labs  Lab 10/29/17 1218  NA 131*  K 4.9  CL 105  CO2 19*  GLUCOSE 241*  BUN 99*  CREATININE 3.37*  CALCIUM 8.5*  AST 22  ALT 23  ALKPHOS 188*  BILITOT 0.5   ------------------------------------------------------------------------------------------------------------------  Cardiac Enzymes No results for input(s): TROPONINI in the last 168 hours. ------------------------------------------------------------------------------------------------------------------  RADIOLOGY:  Dg Chest Port 1 View  Result Date: 10/29/2017 CLINICAL DATA:  72 year old male with increased weakness and disorientation EXAM: PORTABLE CHEST 1 VIEW COMPARISON:  Prior chest x-ray 08/26/2010; prior PET-CT 09/21/2017 FINDINGS: Right IJ approach single-lumen port catheter. The catheter tip overlies the right innominate vein. Cardiomegaly. Mild pulmonary vascular congestion without overt edema. Large left upper lobe pulmonary nodule as seen on prior PET-CT. No acute osseous abnormality. No pleural effusion or pneumothorax. IMPRESSION: 1. Cardiomegaly and pulmonary vascular congestion without overt edema. 2. Right IJ approach single-lumen port catheter with the tip overlying the innominate vein. 3. Left upper lobe pulmonary mass as seen on prior PET-CT. Electronically Signed   By: Jacqulynn Cadet M.D.   On: 10/29/2017 11:44    EKG:    IMPRESSION AND PLAN:   Dairon Procter  is a 72 y.o. 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  1. progressive weakness and decline in overall medical condition -has significant chronic comorbidities with chronic medical problems -labs are at baseline. While  stable. Blood pressure during my evaluation was 129/80. His Sats were 97% on room air. -Admit to medical floor -IV fluids -physical therapy consultation -care management for discharge planning -dietitian for nutrition evaluation and suggestion for supplements  2. 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 Erlene Quan as outpatient  3. Type II diabetes -sliding scale insulin -is on glipizide and Actos which I have held -pt benefit from being on long-acting insulin. Physician needs to discuss with patient's wife to see if she will be able to manage this at home. -Diabetes coordinator consult  4. Relative hypotension hold of blood pressure meds  5. 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 Grayland Ormond as outpatient -currently getting radiation to lumbar and  spine at the cancer center   All the records are reviewed and case discussed with ED provider. Management plans discussed with the patient, family and they are in agreement.  CODE STATUS: FULL  TOTAL TIME TAKING CARE OF THIS PATIENT: 50 minutes.    Fritzi Mandes M.D on 10/29/2017 at 3:25 PM  Between 7am to 6pm - Pager - [phone]  After 6pm go to [email or web address] - password [email or web address] [facility]  [facility]  Office  [phone]  CC: Primary care physician; Sofie Hartigan, MD

## 2017-10-29 NOTE — ED Notes (Signed)
X-ray  At bedside 

## 2017-10-29 NOTE — ED Triage Notes (Signed)
Pt presents from home via EMS with c/o increased weakness and disorientation per wife. Wife reports that symptoms are not new, however they are worse. Report pt slept poorly last pm. Also reports pt was d/c from hospital on Thursday.

## 2017-10-29 NOTE — ED Notes (Signed)
ED Provider at bedside. 

## 2017-10-29 NOTE — ED Notes (Signed)
Wife states she is unable to take care of pt at home due to increased weakness. Pts wife states that problems arent acute however they are worsening.

## 2017-10-29 NOTE — ED Notes (Signed)
Unable to give report at this time. RN to call to receive report.

## 2017-10-29 NOTE — ED Provider Notes (Signed)
[facility] Emergency Department Provider Note   ____________________________________________    I have reviewed the triage vital signs and the nursing notes.   HISTORY  Chief Complaint Weakness     HPI Tyler Henderson is a 72 y.o. male who presents with generalized weakness, confusion.  Patient with a history of metastatic prostate cancer, recently admitted to the hospital for urinary retention, acute kidney injury.  Presents today with reports of confusion per wife this morning she states he has never been so confused as he was this morning.  He does have a new indwelling Foley catheter secondary to the urinary retention.  No fevers reported.  No cough.  No nausea vomiting chest pain.  Records reviewed had an MRI within the last week which was negative for brain metastases  Past Medical History:  Diagnosis Date  . Anxiety   . Arthritis   . Bladder cancer ([place])   . Chronic kidney disease   . Depression   . Diabetes ([place])   . Dysrhythmia    atrial fib  . GERD (gastroesophageal reflux disease)   . Hypertension   . Restless leg syndrome   . Shortness of breath dyspnea    with exertion  . Skin cancer   . Sleep apnea    CPAP "once in awhile"  . 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 Metastasis from malignant melanoma of skin ([place])   . Acute kidney injury ([place]) 10/23/2017  . Urinary retention   . Malignant melanoma, metastatic ([place]) 10/15/2017  . Goals of care, counseling/discussion 10/15/2017  . Mass of upper lobe of left lung 10/08/2017  . Liver masses 09/17/2017  . Microscopic hematuria 03/07/2016  . Simple renal cyst 03/07/2016  . Nephrolithiasis 03/07/2016  . Chronic renal failure, stage 3 (moderate) ([place]) 03/07/2016    Past Surgical History:  Procedure Laterality Date  . APPENDECTOMY    . JOINT REPLACEMENT Left    Total Knee Replacement  .  KNEE ARTHROSCOPY Left   . PORTA CATH INSERTION N/A 10/19/2017   Procedure: PORTA CATH INSERTION;  Surgeon: Algernon Huxley, MD;  Location: [place] [facility];  Service: Cardiovascular;  Laterality: N/A;  . REPLACEMENT TOTAL KNEE Left 2012   Dr. [facility] Ishikawa, Jr. [facility]  . TRANSURETHRAL RESECTION OF BLADDER TUMOR N/A 03/14/2016   Procedure: TRANSURETHRAL RESECTION OF BLADDER TUMOR (TURBT);  Surgeon: Hollice Espy, MD;  Location: [facility] OR[facility];  Service: Urology;  Laterality: N/A;    Prior to Admission medications   Medication Sig Start Date End Date Taking? Authorizing Provider  acetaminophen (TYLENOL) 650 MG CR tablet Take 650 mg by mouth every 12 (twelve) hours as needed for pain.   Yes [provider]  ALPRAZolam Duanne Moron) 0.5 MG tablet Take 1 tablet (0.5 mg total) by mouth at bedtime as needed for anxiety. 10/02/17  Yes Lloyd Huger, MD  aspirin EC 81 MG tablet Take 81 mg by mouth every evening.    Yes [provider]  atorvastatin (LIPITOR) 40 MG tablet Take 40 mg by mouth in the evening 03/23/15  Yes [provider]  carvedilol (COREG) 25 MG tablet Take 25 mg by mouth twice daily 06/26/15  Yes [provider]  docusate sodium (COLACE) 100 MG capsule Take 1 capsule (100 mg total) by mouth 2 (two) times daily. Patient taking differently: Take 100-200 mg by mouth daily as needed for  moderate constipation.  03/14/16  Yes Hollice Espy, MD  ferrous sulfate (SLOW RELEASE IRON) 160 (50 Fe) MG TBCR SR tablet Take 160 mg by mouth 2 (two) times daily.    Yes [provider]  FLUoxetine (PROZAC) 10 MG capsule Take 10 mg by mouth daily.  10/14/17  Yes [provider]  gabapentin (NEURONTIN) 300 MG capsule Take 300 mg by mouth every evening.    Yes [provider]  glipiZIDE (GLUCOTROL) 10 MG tablet Take 10 mg by mouth in the evening 03/23/15  Yes [provider]  lidocaine-prilocaine (EMLA) cream Apply to affected area once 10/15/17  Yes  Finnegan, Kathlene November, MD  lisinopril (PRINIVIL,ZESTRIL) 40 MG tablet Take 40 mg by mouth in the evening 06/25/15  Yes [provider]  megestrol (MEGACE) 40 MG tablet Take 1 tablet (40 mg total) by mouth daily. 10/13/17  Yes Lloyd Huger, MD  Multiple Vitamin (MULTI-VITAMINS) TABS Take 1 tablet by mouth daily. Alive Mens Multivitamin   Yes [provider]  Omega-3 Fatty Acids (FISH OIL PO) Take 1 capsule by mouth 2 (two) times daily.    Yes [provider]  omeprazole (PRILOSEC) 20 MG capsule Take 20 mg by mouth in the evening 03/23/15  Yes [provider]  Oxycodone HCl 10 MG TABS Take 1 tablet (10 mg total) by mouth 2 (two) times daily as needed. Patient taking differently: Take 10 mg by mouth 2 (two) times daily as needed (for pain).  10/13/17  Yes Lloyd Huger, MD  patiromer (VELTASSA) 8.4 g packet Take 8.4 g by mouth 2 (two) times daily.   Yes [provider]  tamsulosin (FLOMAX) 0.4 MG CAPS capsule Take 1 capsule (0.4 mg total) by mouth daily after supper. 10/26/17  Yes Bettey Costa, MD  Nutritional Supplements (FEEDING SUPPLEMENT, NEPRO CARB STEADY,) LIQD Take 237 mLs by mouth daily. 10/26/17 11/25/17  Bettey Costa, MD     Allergies Patient has no known allergies.  Family History  Problem Relation Age of Onset  . Cancer Mother        Breast  . Heart failure Father   . Prostate cancer Neg Hx   . Bladder Cancer Neg Hx     Social History Social History   Tobacco Use  . Smoking status: Former Smoker    Packs/day: 1.00    Years: 20.00    Pack years: 20.00    Types: Cigarettes    Last attempt to quit: 1979    Years since quitting: 40.2  . Smokeless tobacco: Never Used  Substance Use Topics  . Alcohol use: Yes    Alcohol/week: 1.2 oz    Types: 2 Cans of beer per week    Comment: occassional  . Drug use: No    Review of Systems  Constitutional: No fever/chills Eyes: No visual changes.  ENT: No sore throat. Cardiovascular:  Denies chest pain. Respiratory: Denies shortness of breath. Gastrointestinal: No abdominal pain.    Genitourinary: Negative for dysuria. Musculoskeletal: Negative for back pain. Skin: Negative for rash. Neurological: Negative for headaches    ____________________________________________   PHYSICAL EXAM:  VITAL SIGNS: ED Triage Vitals  Enc Vitals Group     BP 10/29/17 1106 107/60     Pulse Rate 10/29/17 1106 100     Resp 10/29/17 1106 14     Temp 10/29/17 1106 98.1 F (36.7 C)     Temp Source 10/29/17 1106 Oral     SpO2 10/29/17 1106 95 %  Weight 10/29/17 1109 (!) 148.3 kg (327 lb)     Height 10/29/17 1109 1.854 m (6\' 1" )     Head Circumference --      Peak Flow --      Pain Score 10/29/17 1109 0     Pain Loc --      Pain Edu? --      Excl. in [place]? --     Constitutional: Alert and oriented. No acute distress. Pleasant and interactive Eyes: Conjunctivae are normal.   Nose: No congestion/rhinnorhea. Mouth/Throat: Mucous membranes are moist.    Cardiovascular: Normal rate, regular rhythm. Grossly normal heart sounds.  Good peripheral circulation. Respiratory: Normal respiratory effort.  No retractions. Lungs CTAB. Gastrointestinal: Soft and nontender. No distention.   Genitourinary: Foley catheter, yellow urine Musculoskeletal: No lower extremity tenderness nor edema.  Warm and well perfused Neurologic:  Normal speech and language. No gross focal neurologic deficits are appreciated.  Skin:  Skin is warm, dry and intact. No rash noted. Psychiatric: Mood and affect are normal. Speech and behavior are normal.  ____________________________________________   LABS (all labs ordered are listed, but only abnormal results are displayed)  Labs Reviewed  CBC - Abnormal; Notable for the following components:      Result Value   RBC 3.07 (*)    Hemoglobin 8.1 (*)    HCT 25.5 (*)    MCHC 31.7 (*)    All other components within normal limits  URINALYSIS, COMPLETE (UACMP)  WITH MICROSCOPIC - Abnormal; Notable for the following components:   Color, Urine YELLOW (*)    APPearance CLOUDY (*)    Hgb urine dipstick MODERATE (*)    Protein, ur 30 (*)    Leukocytes, UA TRACE (*)    Squamous Epithelial / LPF 0-5 (*)    All other components within normal limits  COMPREHENSIVE METABOLIC PANEL - Abnormal; Notable for the following components:   Sodium 131 (*)    CO2 19 (*)    Glucose, Bld 241 (*)    BUN 99 (*)    Creatinine, Ser 3.37 (*)    Calcium 8.5 (*)    Total Protein 5.8 (*)    Albumin 2.1 (*)    Alkaline Phosphatase 188 (*)    GFR calc non Af Amer 17 (*)    GFR calc Af Amer 19 (*)    All other components within normal limits   ____________________________________________  EKG  None ____________________________________________  RADIOLOGY  Chest x-ray negative for pneumonia ____________________________________________   PROCEDURES  Procedure(s) performed: No  Procedures   Critical Care performed: No ____________________________________________   INITIAL IMPRESSION / ASSESSMENT AND PLAN / ED COURSE  Pertinent labs & imaging results that were available during my care of the patient were reviewed by me and considered in my medical decision making (see chart for details).  Patient presents with reports of diffuse weakness, confusion this morning.  Given indwelling catheter urinary tract infection was considered but urine appears unremarkable, will send culture.  Lab work is overall not significantly changed from prior.  Chest x-ray negative for pneumonia.  Recent MRI negative for brain metastases.  However family is unable to care for the patient and his current state, will admit to the hospitalist for further management    ____________________________________________   FINAL CLINICAL IMPRESSION(S) / ED DIAGNOSES  Final diagnoses:  Dehydration  Generalized weakness        Note:  This document was prepared using Dragon voice  recognition software and may include unintentional  dictation errors.    Lavonia Drafts, MD 10/29/17 1450

## 2017-10-30 DIAGNOSIS — C799 Secondary malignant neoplasm of unspecified site: Secondary | ICD-10-CM

## 2017-10-30 DIAGNOSIS — G893 Neoplasm related pain (acute) (chronic): Secondary | ICD-10-CM

## 2017-10-30 DIAGNOSIS — Z515 Encounter for palliative care: Secondary | ICD-10-CM | POA: Diagnosis not present

## 2017-10-30 DIAGNOSIS — R531 Weakness: Secondary | ICD-10-CM

## 2017-10-30 DIAGNOSIS — Z66 Do not resuscitate: Secondary | ICD-10-CM

## 2017-10-30 LAB — URINE CULTURE: Culture: NO GROWTH

## 2017-10-30 LAB — GLUCOSE, CAPILLARY
GLUCOSE-CAPILLARY: 170 mg/dL — AB (ref 65–99)
GLUCOSE-CAPILLARY: 199 mg/dL — AB (ref 65–99)
GLUCOSE-CAPILLARY: 149 mg/dL — AB (ref 65–99)
Glucose-Capillary: 156 mg/dL — ABNORMAL HIGH (ref 65–99)

## 2017-10-30 MED ORDER — BISACODYL 5 MG PO TBEC: 5.0000 mg | DELAYED_RELEASE_TABLET | Freq: Every day | ORAL | Status: DC | PRN

## 2017-10-30 MED ORDER — FLUOXETINE HCL 10 MG PO CAPS
10.0000 mg | ORAL_CAPSULE | Freq: Every day | ORAL | Status: DC
  Administered 2017-10-31 – 2017-11-03 (×4): 10 mg via ORAL
  Filled 2017-10-30 (×4): qty 1

## 2017-10-30 MED ORDER — INSULIN GLARGINE 100 UNIT/ML ~~LOC~~ SOLN
5.0000 [IU] | Freq: Every day | SUBCUTANEOUS | Status: DC
  Administered 2017-10-30 – 2017-11-03 (×5): 5 [IU] via SUBCUTANEOUS
  Filled 2017-10-30 (×6): qty 0.05

## 2017-10-30 MED ORDER — ALPRAZOLAM 0.5 MG PO TABS
0.5000 mg | ORAL_TABLET | Freq: Every evening | ORAL | Status: DC | PRN
  Administered 2017-10-30 – 2017-11-02 (×3): 0.5 mg via ORAL
  Filled 2017-10-30 (×3): qty 1

## 2017-10-30 MED ORDER — OXYCODONE HCL 5 MG PO TABS
10.0000 mg | ORAL_TABLET | ORAL | Status: DC | PRN
  Administered 2017-10-30 – 2017-11-02 (×6): 10 mg via ORAL
  Filled 2017-10-30 (×6): qty 2

## 2017-10-30 MED ORDER — SIMETHICONE 80 MG PO CHEW
80.0000 mg | CHEWABLE_TABLET | Freq: Four times a day (QID) | ORAL | Status: DC | PRN
  Administered 2017-10-30 (×2): 80 mg via ORAL
  Filled 2017-10-30 (×3): qty 1

## 2017-10-30 MED ORDER — OXYCODONE HCL 5 MG PO TABS
5.0000 mg | ORAL_TABLET | ORAL | Status: DC | PRN
  Administered 2017-10-30: 5 mg via ORAL
  Filled 2017-10-30: qty 1

## 2017-10-30 MED ORDER — POLYETHYLENE GLYCOL 3350 17 G PO PACK
17.0000 g | PACK | Freq: Every day | ORAL | Status: DC
  Administered 2017-10-31 – 2017-11-02 (×3): 17 g via ORAL
  Filled 2017-10-30 (×3): qty 1

## 2017-10-30 MED ORDER — SENNOSIDES-DOCUSATE SODIUM 8.6-50 MG PO TABS
1.0000 | ORAL_TABLET | Freq: Two times a day (BID) | ORAL | Status: DC
  Administered 2017-10-30 – 2017-11-02 (×6): 1 via ORAL
  Filled 2017-10-30 (×6): qty 1

## 2017-10-30 MED ORDER — OXYCODONE HCL 5 MG PO TABS
5.0000 mg | ORAL_TABLET | ORAL | Status: DC | PRN
  Administered 2017-11-01 – 2017-11-02 (×2): 5 mg via ORAL
  Filled 2017-10-30 (×2): qty 1

## 2017-10-30 MED ORDER — INSULIN STARTER KIT- PEN NEEDLES (ENGLISH)
1.0000 | Freq: Once | Status: AC
  Administered 2017-10-31: 1
  Filled 2017-10-30: qty 1

## 2017-10-30 MED ORDER — ACETAMINOPHEN 325 MG PO TABS
650.0000 mg | ORAL_TABLET | Freq: Two times a day (BID) | ORAL | Status: DC
  Administered 2017-10-30 – 2017-11-03 (×8): 650 mg via ORAL
  Filled 2017-10-30 (×8): qty 2

## 2017-10-30 MED ORDER — GLUCERNA SHAKE PO LIQD
237.0000 mL | Freq: Two times a day (BID) | ORAL | Status: DC
  Administered 2017-10-31 – 2017-11-03 (×6): 237 mL via ORAL

## 2017-10-30 NOTE — Consult Note (Signed)
PHARMACIST PAIN CONSULT   Tyler Henderson is an 72 y.o. male who presented to [facility] on 10/29/2017 with a chief complaint of  Chief Complaint  Patient presents with  . Weakness   .   Past Medical History:  Diagnosis Date  . Anxiety   . Arthritis   . Bladder cancer ([place])   . Chronic kidney disease   . Depression   . Diabetes ([place])   . Dysrhythmia    atrial fib  . GERD (gastroesophageal reflux disease)   . Hypertension   . Restless leg syndrome   . Shortness of breath dyspnea    with exertion  . Skin cancer   . Sleep apnea    CPAP "once in awhile"  . 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 Patient usually takes the oxycodone in the at night before bed to help with pain. He reports that when he is at home and at rest his baseline pain is usually a 8. He reports pain in ankles, upper leg, and back. He does feel sedated after taking the oxycodone at home, but does feel like his pain is less when he takes the oxycodone and acetaminophen.    Date Pain Score 4/8      5  Pain Goal in Hospital: 4  Patient's pain appears to be uncontrolled.  Patient received acetaminophen 650mg  and oxycodone 5mg  x 1 dose at 1030.  Will adjust patient's PRN pain orders to help with better pain coverage.   Plan:  Will schedule acetaminophen 650mg  every 12 hours.   Mild: Acetaminophen 650mg   Moderate: Oxycodone 5mg  Severe: Oxycodone 10mg  Breakthrough: None  Constipation: Senna/Docusate BID, Miralax QD, Bisacodyl PRN  Pharmacist will continue to monitor patient's pain scores and bowel movements and adjust therapies  as needed.   Tyler Henderson, PharmD, BCPS Clinical Pharmacist 10/30/2017 1:31 PM

## 2017-10-30 NOTE — Consult Note (Signed)
Consultation Note Date: 10/30/2017   Patient Name: Tyler Henderson  DOB: 08/10/1945  MRN: [id number]  Age / Sex: 72 y.o., male  PCP: Sofie Hartigan, MD Referring Physician: Gladstone Lighter, MD  Reason for Consultation: Establishing goals of care and Psychosocial/spiritual support  HPI/Patient Profile: 72 y.o. male  admitted on 10/29/2017 with  known history of Stage IV metastatic melanoma with liver, lung, and bony metastasis: PET scan results from September 13, 2017 reviewed independently with widespread metastatic disease.  MRI of the brain negative for disease.  Liver biopsy consistent with metastatic melanoma. Patient is under treatment with Dr. Maryjane Hurter and has agreed to palliative immunotherapy using the nivolumab and and ipilimab.    Patient and family verbalized an understanding that his disease is not curable.  This is the second admission in less than 2 weeks.  Patient has continued physical, functional, and cognitive decline.  Family verbalize their great concern and frustration with the increased care needs at home.  They do not understand why he was not admitted for rehabilitation last admission.  Patient family face treatment option decisions, advanced directive decisions, and anticipatory care needs.     Clinical Assessment and Goals of Care:  This NP Wadie Lessen reviewed medical records, received report from team, assessed the patient and then meet at the patient's bedside along with his wife/[name] and daughter/ [name]  to discuss diagnosis, prognosis, GOC, EOL wishes disposition and options.  Concept of Hospice and Palliative Care were discussed  A detailed discussion was had today regarding advanced directives.  Concepts specific to code status, artifical feeding and hydration, continued IV antibiotics and rehospitalization was had.  The difference between a aggressive medical  intervention path  and a palliative comfort care path for this patient at this time was had.  Values and goals of care important to patient and family were attempted to be elicited.      MOST form introduced  Today we discussed the concept of failure to thrive within the context of serious life limiting illness.  We discussed care options available to patient's in both facilities and in the home.  Family verbalize frustration with the limited care options.  At this time patient and family are open to treatment to extend life and they remain hopeful for improvement in functional status.    They are hopeful after a physical therapy evaluation that the patient would be eligible for short-term rehab.  They hope that urology will evaluate patient for his current urinary retention with indwelling Foley while he is here in the hospital.  They hope that they can speak to Dr. Maryjane Hurter why the patient is here in the hospital.  And they hope that radiation will be initiated while here in the hospital.  Dr. Tressia Miners is coordinating care between multiple providers.  Questions and concerns addressed.   Family encouraged to call with questions or concerns.    PMT will continue to support holistically.  HCPOA/wife   SUMMARY OF RECOMMENDATIONS    Code Status/Advance Care Planning:  DNR-documented today   Symptom Management:   Family report poor pain control.  Currently patient has ordered oxycodone 5 mg p.o. every 4 hours as needed and Tylenol 650 mg p.o. every 6 hours as needed.  I discussed in detail the importance of 24-hour assessment of pain needs before making any changes.  Discussed with nursing.  I will reassess in am and make recommendations   Scheduled to start radiation tomorrow, hopefully this will help with pain control  Palliative Prophylaxis:   Bowel Regimen, Delirium Protocol and Frequent Pain Assessment  Additional Recommendations (Limitations, Scope, Preferences):  Full Scope  Treatment  Psycho-social/Spiritual:   Desire for further [place] support  Additional Recommendations: Education on Hospice  Prognosis:   Unable to determine  Discharge Planning:    Family is hopeful for a  skilled nursing facility for short-term rehab.  To Be Determined      Primary Diagnoses: Present on Admission: **None**   I have reviewed the medical record, interviewed the patient and family, and examined the patient. The following aspects are pertinent.  Past Medical History:  Diagnosis Date  . Anxiety   . Arthritis   . Bladder cancer ([place])   . Chronic kidney disease   . Depression   . Diabetes ([place])   . Dysrhythmia    atrial fib  . GERD (gastroesophageal reflux disease)   . Hypertension   . Restless leg syndrome   . Shortness of breath dyspnea    with exertion  . Skin cancer   . Sleep apnea    CPAP "once in awhile"  . 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 Marital status: Married    Spouse name: Not on file  . Number of children: Not on file  . Years of education: Not on file  . Highest education level: Not on file  Occupational History  . Not on file  Social Needs  . Financial resource strain: Not on file  . Food insecurity:    Worry: Not on file    Inability: Not on file  . Transportation needs:    Medical: Not on file    Non-medical: Not on file  Tobacco Use  . Smoking status: Former Smoker    Packs/day: 1.00    Years: 20.00    Pack years: 20.00    Types: Cigarettes    Last attempt to quit: 1979    Years since quitting: 40.2  . Smokeless tobacco: Never Used  Substance and Sexual Activity  . Alcohol use: Yes    Alcohol/week: 1.2 oz    Types: 2 Cans of beer per week    Comment: occassional  . Drug use: No  . Sexual activity: Not on file  Lifestyle  . Physical activity:    Days per week: Not on file    Minutes per session: Not on file  . Stress: Not on  file  Relationships  . Social connections:    Talks on phone: Not on file    Gets together: Not on file    Attends religious service: Not on file    Active member of club or organization: Not on file    Attends meetings of clubs or organizations: Not on file    Relationship status: Not on file  Other Topics Concern  . Not on file  Social History Narrative  . Not on file   Family History  Problem Relation Age of Onset  . Cancer Mother  Breast  . Heart failure Father   . Prostate cancer Neg Hx   . Bladder Cancer Neg Hx    Scheduled Meds: . docusate sodium  100 mg Oral BID  . heparin  5,000 Units Subcutaneous Q8H  . insulin aspart  0-9 Units Subcutaneous TID WC   Continuous Infusions: PRN Meds:.acetaminophen **OR** acetaminophen, ondansetron **OR** ondansetron (ZOFRAN) IV, oxyCODONE, polyethylene glycol, simethicone Medications Prior to Admission:  Prior to Admission medications   Medication Sig Start Date End Date Taking? Authorizing Provider  acetaminophen (TYLENOL) 650 MG CR tablet Take 650 mg by mouth every 12 (twelve) hours as needed for pain.   Yes [provider]  ALPRAZolam Duanne Moron) 0.5 MG tablet Take 1 tablet (0.5 mg total) by mouth at bedtime as needed for anxiety. 10/02/17  Yes Lloyd Huger, MD  aspirin EC 81 MG tablet Take 81 mg by mouth every evening.    Yes [provider]  atorvastatin (LIPITOR) 40 MG tablet Take 40 mg by mouth in the evening 03/23/15  Yes [provider]  carvedilol (COREG) 25 MG tablet Take 25 mg by mouth twice daily 06/26/15  Yes [provider]  docusate sodium (COLACE) 100 MG capsule Take 1 capsule (100 mg total) by mouth 2 (two) times daily. Patient taking differently: Take 100-200 mg by mouth daily as needed for moderate constipation.  03/14/16  Yes Hollice Espy, MD  ferrous sulfate (SLOW RELEASE IRON) 160 (50 Fe) MG TBCR SR tablet Take 160 mg by mouth 2 (two) times daily.    Yes [provider]  FLUoxetine (PROZAC) 10 MG capsule Take 10 mg by mouth daily.  10/14/17  Yes [provider]  gabapentin (NEURONTIN) 300 MG capsule Take 300 mg by mouth every evening.    Yes [provider]  glipiZIDE (GLUCOTROL) 10 MG tablet Take 10 mg by mouth in the evening 03/23/15  Yes [provider]  lidocaine-prilocaine (EMLA) cream Apply to affected area once 10/15/17  Yes Finnegan, Kathlene November, MD  lisinopril (PRINIVIL,ZESTRIL) 40 MG tablet Take 40 mg by mouth in the evening 06/25/15  Yes [provider]  megestrol (MEGACE) 40 MG tablet Take 1 tablet (40 mg total) by mouth daily. 10/13/17  Yes Lloyd Huger, MD  Multiple Vitamin (MULTI-VITAMINS) TABS Take 1 tablet by mouth daily. Alive Mens Multivitamin   Yes [provider]  Omega-3 Fatty Acids (FISH OIL PO) Take 1 capsule by mouth 2 (two) times daily.    Yes [provider]  omeprazole (PRILOSEC) 20 MG capsule Take 20 mg by mouth in the evening 03/23/15  Yes [provider]  Oxycodone HCl 10 MG TABS Take 1 tablet (10 mg total) by mouth 2 (two) times daily as needed. Patient taking differently: Take 10 mg by mouth 2 (two) times daily as needed (for pain).  10/13/17  Yes Lloyd Huger, MD  patiromer (VELTASSA) 8.4 g packet Take 8.4 g by mouth 2 (two) times daily.   Yes [provider]  pioglitazone (ACTOS) 45 MG tablet Take 45 mg by mouth daily.   Yes [provider]  tamsulosin (FLOMAX) 0.4 MG CAPS capsule Take 1 capsule (0.4 mg total) by mouth daily after supper. 10/26/17  Yes Bettey Costa, MD  Nutritional Supplements (FEEDING SUPPLEMENT, NEPRO CARB STEADY,) LIQD Take 237 mLs by mouth daily. 10/26/17 11/25/17  Bettey Costa, MD   No Known Allergies Review of Systems  Constitutional: Positive for appetite change and fatigue.  Musculoskeletal: Positive for back pain.  Neurological: Positive for weakness.    Physical Exam  Constitutional: He appears  well-developed.  Cardiovascular: Tachycardia present.  Pulmonary/Chest: Effort normal.  Abdominal: Soft. Bowel sounds are normal.  Musculoskeletal: He exhibits edema.  generalized weakness  Neurological: He is alert.  -family note that cognitively patient is not at baseline, intermittently confused  Skin: Skin is warm and dry.  - bilateral heel redness and tenderness    Vital Signs: BP (!) 124/53 (BP Location: Left Arm)   Pulse (!) 102   Temp (!) 97.5 F (36.4 C) (Oral)   Resp (!) 22   Ht 6\' 1"  (1.854 m)   Wt (!) 154.7 kg (341 lb)   SpO2 93%   BMI 44.99 kg/m  Pain Scale: Faces   Pain Score: Asleep   SpO2: SpO2: 93 % O2 Device:SpO2: 93 % O2 Flow Rate: .   IO: Intake/output summary:   Intake/Output Summary (Last 24 hours) at 10/30/2017 1027 Last data filed at 10/30/2017 0900 Gross per 24 hour  Intake 1042.5 ml  Output 1601 ml  Net -558.5 ml    LBM: Last BM Date: 10/29/17(pt stated) Baseline Weight: Weight: (!) 148.3 kg (327 lb) Most recent weight: Weight: (!) 154.7 kg (341 lb)      Palliative Assessment/Data: 30 % at best   Flowsheet Rows     Most Recent Value  Intake Tab  Referral Department  Hospitalist  Unit at Time of Referral  Med/Surg Unit  Palliative Care Primary Diagnosis  Cancer  Date Notified  10/30/17  Palliative Care Type  New Palliative care  Reason for referral  Clarify Goals of Care  Date of Admission  10/29/17  # of days IP prior to Palliative referral  1  Clinical Assessment  Psychosocial & Spiritual Assessment  Palliative Care Outcomes     Discussed with Dr. Tressia Miners   Time In: 0900 Time Out: 1015 Time Total: 75 minutes Greater than 50%  of this time was spent counseling and coordinating care related to the above assessment and plan.  Signed by: Wadie Lessen, NP   Please contact Palliative Medicine Team phone at [phone] for questions and concerns.  For individual provider: See Shea Evans

## 2017-10-30 NOTE — Clinical Social Work Placement (Signed)
   CLINICAL SOCIAL WORK PLACEMENT  NOTE  Date:  10/30/2017  Patient Details  Name: Tyler Henderson MRN: [id number] Date of Birth: Jun 20, 1946  Clinical Social Work is seeking post-discharge placement for this patient at the [place] level of care (*CSW will initial, date and re-position this form in  chart as items are completed):  Yes   Patient/family provided with [facility] Work Department's list of facilities offering this level of care within the geographic area requested by the patient (or if unable, by the patient's family).  Yes   Patient/family informed of their freedom to choose among providers that offer the needed level of care, that participate in Medicare, Medicaid or managed care program needed by the patient, have an available bed and are willing to accept the patient.  Yes   Patient/family informed of [facility]'s ownership interest in [facility] and [facility], as well as of the fact that they are under no obligation to receive care at these facilities.  PASRR submitted to EDS on 10/30/17     PASRR number received on 10/30/17     Existing PASRR number confirmed on       FL2 transmitted to all facilities in geographic area requested by pt/family on 10/30/17     FL2 transmitted to all facilities within larger geographic area on       Patient informed that his/her managed care company has contracts with or will negotiate with certain facilities, including the following:            Patient/family informed of bed offers received.  Patient chooses bed at       Physician recommends and patient chooses bed at      Patient to be transferred to   on  .  Patient to be transferred to facility by       Patient family notified on   of transfer.  Name of family member notified:        PHYSICIAN       Additional Comment:    _______________________________________________ Macrae Wiegman, Veronia Beets, LCSW 10/30/2017, 2:37 PM

## 2017-10-30 NOTE — NC FL2 (Signed)
[place] LEVEL OF CARE SCREENING TOOL     IDENTIFICATION  Patient Name: Tyler Henderson Birthdate: Feb 25, 1946 Sex: male Admission Date (Current Location): 10/29/2017  [place] and [place] Number:  Engineering geologist and Address:  [facility], [address] [place], [place] [address]      Provider Number: [id number]  Attending Physician Name and Address:  Gladstone Lighter, MD  Relative Name and Phone Number:       Current Level of Care: Hospital Recommended Level of Care: [place] Prior Approval Number:    Date Approved/Denied:   PASRR Number: [id number])  Discharge Plan: SNF    Current Diagnoses: Patient Active Problem List   Diagnosis Date Noted  . Weakness 10/29/2017  . 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 Metastasis from malignant melanoma of skin ([place])   . Acute kidney injury ([place]) 10/23/2017  . Urinary retention   . Malignant melanoma, metastatic ([place]) 10/15/2017  . Goals of care, counseling/discussion 10/15/2017  . Mass of upper lobe of left lung 10/08/2017  . Liver masses 09/17/2017  . Microscopic hematuria 03/07/2016  . Simple renal cyst 03/07/2016  . Nephrolithiasis 03/07/2016  . Chronic renal failure, stage 3 (moderate) (HCC) 03/07/2016    Orientation RESPIRATION BLADDER Height & Weight     Self, Time, Situation, Place  Normal Continent Weight: (!) 341 lb (154.7 kg) Height:  6' 1"  (185.4 cm)  BEHAVIORAL SYMPTOMS/MOOD NEUROLOGICAL BOWEL NUTRITION STATUS      Continent Diet(Diet: Renal/ Carb Modified. )  AMBULATORY STATUS COMMUNICATION OF NEEDS Skin   Extensive Assist Verbally Normal                       Personal Care Assistance Level of Assistance  Bathing, Feeding, Dressing Bathing Assistance: Limited assistance Feeding assistance: Independent Dressing Assistance: Limited assistance     Functional Limitations Info  Sight, Hearing, Speech Sight Info: Adequate Hearing Info:  Adequate Speech Info: Adequate    SPECIAL CARE FACTORS FREQUENCY  PT (By licensed PT), OT (By licensed OT)(Radiation at [facility] )     PT Frequency: (5) OT Frequency: (5)            Contractures      Additional Factors Info  Code Status, Allergies Code Status Info: (DNR ) Allergies Info: (No Known Allergies. )           Current Medications (10/30/2017):  This is the current hospital active medication list Current Facility-Administered Medications  Medication Dose Route Frequency Provider Last Rate Last Dose  . acetaminophen (TYLENOL) tablet 650 mg  650 mg Oral Q6H PRN Fritzi Mandes, MD   650 mg at 10/30/17 1029   Or  . acetaminophen (TYLENOL) suppository 650 mg  650 mg Rectal Q6H PRN Fritzi Mandes, MD      . acetaminophen (TYLENOL) tablet 650 mg  650 mg Oral Q12H Hallaji, Sheema M, RPH      . bisacodyl (DULCOLAX) EC tablet 5 mg  5 mg Oral Daily PRN Pernell Dupre, RPH      . heparin injection 5,000 Units  5,000 Units Subcutaneous Q8H Fritzi Mandes, MD   5,000 Units at 10/30/17 1235  . insulin aspart (novoLOG) injection 0-9 Units  0-9 Units Subcutaneous TID WC Fritzi Mandes, MD   2 Units at 10/30/17 1235  . insulin glargine (LANTUS) injection 5 Units  5 Units Subcutaneous Daily Gladstone Lighter, MD      . insulin starter kit- pen needles (  English) 1 kit  1 kit Other Once Gladstone Lighter, MD      . ondansetron [facility]) tablet 4 mg  4 mg Oral Q6H PRN Fritzi Mandes, MD       Or  . ondansetron [facility]) injection 4 mg  4 mg Intravenous Q6H PRN Fritzi Mandes, MD      . oxyCODONE (Oxy IR/ROXICODONE) immediate release tablet 10 mg  10 mg Oral Q4H PRN Hallaji, Sheema M, RPH      . oxyCODONE (Oxy IR/ROXICODONE) immediate release tablet 5 mg  5 mg Oral Q4H PRN Hallaji, Sheema M, RPH      . polyethylene glycol (MIRALAX / GLYCOLAX) packet 17 g  17 g Oral Daily Hallaji, Sheema M, RPH      . senna-docusate (Senokot-S) tablet 1 tablet  1 tablet Oral BID Pernell Dupre, RPH      .  simethicone (MYLICON) chewable tablet 80 mg  80 mg Oral QID PRN Harrie Foreman, MD   80 mg at 10/30/17 1235     Discharge Medications: Please see discharge summary for a list of discharge medications.  Relevant Imaging Results:  Relevant Lab Results:   Additional Information (SSN: [id number])  Karma Ansley, Veronia Beets, LCSW

## 2017-10-30 NOTE — Progress Notes (Signed)
PT Cancellation Note  Patient Details Name: Tyler Henderson MRN: [id number] DOB: 06/17/1946   Cancelled Treatment:      PT consult received, chart reviewed. Per neurosurgeon's note 10/24/17 (Dr. Tommi Emery), Pt is recommended to wear LSO when elevated >45 degrees; Pt's wife reports the LSO is too painful for the Pt, so he has not been wearing it and they did not bring the brace with them to hospital. Spoke w/ Dr. Tommi Emery who requested a neurosurgery consult to assess if it is ok for PT to mobilize and ambulate without LSO brace. Spoke to attending physician Dr. Tressia Miners and discussed situation; Dr. Tressia Miners reported she will put in request for neurosurgery consult; RN notified of discussion w/ MD.   Will reattempt eval/treatment at a later date/time after neurosurgery consult completed and recommendations are known.  Jaydeen Odor Mondrian-Pardue, SPT 10/30/2017, 3:50 PM

## 2017-10-30 NOTE — Progress Notes (Addendum)
Inpatient Diabetes Program Recommendations  AACE/ADA: New Consensus Statement on Inpatient Glycemic Control (2015)  Target Ranges:  Prepandial:   less than 140 mg/dL      Peak postprandial:   less than 180 mg/dL (1-2 hours)      Critically ill patients:  140 - 180 mg/dL   Results for DEAGAN, SEVIN (MRN [id number]) as of 10/30/2017 08:11  Ref. Range 10/29/2017 17:04 10/29/2017 21:10 10/30/2017 07:34  Glucose-Capillary Latest Ref Range: 65 - 99 mg/dL 149 (H) 189 (H) 170 (H)  Results for REYNOLD, MANTELL (MRN [id number]) as of 10/30/2017 13:16  Ref. Range 10/24/2017 04:48  Hemoglobin A1C Latest Ref Range: 4.8 - 5.6 % 8.3 (H)   Review of Glycemic Control  Diabetes history: DM2 Outpatient Diabetes medications: Actos 45 mg daily, Glipizide 10 mg QPM Current orders for Inpatient glycemic control: Novolog 0-9 units TID with meals  Inpatient Diabetes Program Recommendations: Insulin - Basal: Noted MD noted on H&P that patient may benefit from long acting insulin at home if wife is able to administer insulin to patient. Fasting glucose 170 mg/dl today. If basal insulin is ordered, recommend starting with Lantus 5 units Q24H. HgbA1C: A1C 8.3% on 10/24/2017 indicating an average glucose of 192 mg/dl over the past 2-3 months.   Note: Noted consult for Inpatient Diabetes Coordinator. Will plan to talk with patient and his wife today regarding possibility of using long acting insulin as an outpatient.  Addendum 10/30/17_0 :15-Spoke with patient and his wife about diabetes and home regimen for diabetes control. Patient reports that he is followed by PCP for diabetes management and he has been taking Glipizide 10 mg QPM and Actos 45 mg daily as an outpatient for diabetes control. Patient reports that he has been taking DM medications as prescribed.  Patient states that he checks his glucose once a day and it has been running in the mid to upper 100's mg/dl and sometimes higher if he eats something he should not be  eating.   Inquired about prior A1C and patient reports that his last A1C was around 7% range. Discussed A1C results (8.3% on 10/24/17) and explained that his current A1C indicates an average glucose of 123m/dl over the past 2-3 months. Discussed glucose and A1C goals. Discussed importance of checking CBGs and maintaining good CBG control to prevent long-term and short-term complications. Stressed to the patient the importance of improving glycemic control to prevent further complications from uncontrolled diabetes. Discussed impact of nutrition, exercise, stress, sickness, and medications on diabetes control. Patient's wife states that patient has a poor appetite and at times can not eat. Inquired about possibility of using insulin at home and patient and his wife are open to using insulin as an outpatient but patient states that his wife will have to give him the injections. Explained that MD notes perhaps basal insulin once a day. Discussed Lantus insulin and how it works. Discussed vial/syringe and insulin pens and patient's wife states that she feels insulin pens would be best for her to use to give her husband injections. Educated patient's wife on insulin pen use at home.  Reviewed all steps of insulin pen including attachment of needle, 2-unit air shot, dialing up dose, giving injection, removing needle, disposal of sharps, storage of unused insulin, disposal of insulin etc. Patient's wife able to provide successful return demonstration. Informed patient and his wife that an insulin starter kit would be ordered and encouraged to read through material once received.  Encouraged patient to check his glucose  at least 2 times per day and to keep a log book of glucose readings and DM medications taken which he will need to take to doctor appointments. Explained how the doctor he follows up with can use the log book to continue to make adjustments with DM medications if needed. Discussed risk of hypoglycemia with  insulin and patient has experienced hypoglycemia in the past. Reviewed proper treatment of hypoglycemia as well.  Explained that if patient's glucose is running consistently elevated or if he experienced hypoglycemia, would need to reach out to PCP for recommendations on adjustments with insulin. Patient and his wife verbalized understanding of information discussed and they stated that they have no further questions at this time related to diabetes.  Thanks, Barnie Alderman, RN, MSN, CDE Diabetes Coordinator Inpatient Diabetes Program [phone] (Team Pager from 8am to 5pm)

## 2017-10-30 NOTE — Clinical Social Work Note (Signed)
Clinical Social Work Assessment  Patient Details  Name: Tyler Henderson MRN: [id number] Date of Birth: 1946/03/15  Date of referral:  10/30/17               Reason for consult:  Facility Placement                Permission sought to share information with:  Chartered certified accountant granted to share information::  Yes, Verbal Permission Granted  Name::      [place]::   [place]   Relationship::     Contact Information:     Housing/Transportation Living arrangements for the past 2 months:  [place] of Information:  Patient, Adult Children, Spouse Patient Interpreter Needed:  None Criminal Activity/Legal Involvement Pertinent to Current Situation/Hospitalization:  No - Comment as needed Significant Relationships:  Adult Children, Spouse Lives with:  Spouse Do you feel safe going back to the place where you live?  Yes Need for family participation in patient care:  Yes (Comment)  Care giving concerns:  Patient lives in [place] with his wife Tyler Henderson [phone].   Social Worker assessment / plan:  Holiday representative (CSW) received consult for discharge planning. Per MD patient will likely need SNF. CSW made MD aware that SNF offers will be limited due to patient's radiation schedule. Oncology has been consulted. PT is pending. CSW met with patient and his wife Tyler Henderson and daughter Tyler Henderson were at bedside. Patient was laying in the bed and was alert and oriented X3. CSW introduced self and explained role of CSW department. Patient's wife reported that he lives with her in [place] and she is HPOA. Per wife patient has been having a hard time getting around and has been very weak. Wife reported that patient was about to start radiation tomorrow at [facility] for 10 days straight. Per wife the cancer center [facility] was going to transport him. CSW explained SNF process and that [facility] will have to approve it. Wife is interested in  SNF because patient is very weak. CSW made wife aware that bed offers will be limited because of his radiation schedule. Per wife she will not be able to transport him to radiation by herself. Daughter insisted that patient have radiation while at [facility]. CSW made MD aware of above. FL2 complete and faxed out.   Employment status:  Disabled (Comment on whether or not currently receiving Disability), Retired Nurse, adult PT Recommendations:  Not assessed at this time Information / Referral to community resources:  [place]  Patient/Family's Response to care:  Patient's daughter insisted that patient have radiation at [facility].   Patient/Family's Understanding of and Emotional Response to Diagnosis, Current Treatment, and Prognosis:  Palliative consult is pending. Patient's wife stated that patient is very weak and is interested in SNF placement.   Emotional Assessment Appearance:  Appears stated age Attitude/Demeanor/Rapport:    Affect (typically observed):  Pleasant Orientation:  Oriented to Self, Oriented to Place, Oriented to  Time, Fluctuating Orientation (Suspected and/or reported Sundowners) Alcohol / Substance use:  Not Applicable Psych involvement (Current and /or in the community):  No (Comment)  Discharge Needs  Concerns to be addressed:  Discharge Planning Concerns Readmission within the last 30 days:  No Current discharge risk:  Dependent with Mobility, Chronically ill Barriers to Discharge:  Continued Medical Work up   [employer], Veronia Beets, LCSW 10/30/2017, 2:39 PM

## 2017-10-30 NOTE — Care Management Note (Addendum)
Case Management Note  Patient Details  Name: Tyler Henderson MRN: [id number] Date of Birth: [birth year]  Subjective/Objective:    Readmit due to urinary retention. Lives with wife. Patient active with [facility] for SN,( PT and SW have no started yet) . Corene Cornea with [facility] notified of admission. RNCM will follow progression.                Action/Plan:   Expected Discharge Date:                  Expected Discharge Plan:  [place]  In-House Referral:     Discharge planning Services  CM Consult  Post Acute Care Choice:  Resumption of Svcs/PTA Provider Choice offered to:     DME Arranged:    DME Agency:     HH Arranged:    [place] Agency:  [place]  Status of Service:  In process, will continue to follow  If discussed at Long Length of Stay Meetings, dates discussed:    Additional Comments:  Jolly Mango, RN 10/30/2017, 2:19 PM

## 2017-10-30 NOTE — Progress Notes (Signed)
Initial Nutrition Assessment  DOCUMENTATION CODES:   Morbid obesity  INTERVENTION:  Provide Glucerna Shake po BID, each supplement provides 220 kcal and 10 grams of protein.  Encouraged adequate intake of protein at meals.  NUTRITION DIAGNOSIS:   Inadequate oral intake related to decreased appetite as evidenced by per patient/family report.  GOAL:   Patient will meet greater than or equal to 90% of their needs  MONITOR:   PO intake, Supplement acceptance, Labs, Weight trends, I & O's  REASON FOR ASSESSMENT:   Malnutrition Screening Tool, Consult Assessment of nutrition requirement/status(diet)  ASSESSMENT:   72 year old male with PMHx of depression, anxiety, DM type 2, HTN, GERD, CKD stage IV, metastatic melanoma (liver, lung, and bone involvement) undergoing XRT who is admitted with progressive weakness.   -Per chart family would like patient to go to [facility].  Met with patient at bedside. He is known to this RD from recent admission last week. Patient reports his appetite is still not good. He reports eating 50% or less of meals. He has been following a low potassium diet per request of one of his doctors. He only was amenable to drinking a low potassium ONS but he did not like the Nepro he was started on last week. He is amenable to trying Glucerna to see if he likes it better.  RD obtained bed scale weight of 151.2 kg. Still seems to be weight-stable.  Medications reviewed and include: Novolog 0-9 units TID, Lantus 5 units daily, Miralax, Senokot-S.  Labs reviewed: CBG 149-218.  Patient is at risk for malnutrition.  Discussed with RN.  NUTRITION - FOCUSED PHYSICAL EXAM:    Most Recent Value  Orbital Region  No depletion  Upper Arm Region  No depletion  Thoracic and Lumbar Region  No depletion  Buccal Region  No depletion  Temple Region  No depletion  Clavicle Bone Region  No depletion  Clavicle and Acromion Bone Region  No depletion  Scapular Bone Region  No  depletion  Dorsal Hand  No depletion  Patellar Region  No depletion  Anterior Thigh Region  No depletion  Posterior Calf Region  No depletion  Edema (RD Assessment)  None  Hair  Reviewed  Eyes  Reviewed  Mouth  Reviewed  Skin  Reviewed  Nails  Reviewed     Diet Order:  Diet renal/carb modified with fluid restriction Diet-HS Snack? Nothing; Fluid restriction: 1200 mL Fluid; Room service appropriate? Yes; Fluid consistency: Thin  EDUCATION NEEDS:   Education needs have been addressed  Skin:  Skin Assessment: Reviewed RN Assessment  Last BM:  10/30/2017 - medium type 4  Height:   Ht Readings from Last 1 Encounters:  10/29/17 _0  (1.854 m)    Weight:   Wt Readings from Last 1 Encounters:  10/30/17 (!) 333 lb 5.4 oz (151.2 kg)    Ideal Body Weight:  80.9 kg  BMI:  Body mass index is 43.98 kg/m.  Estimated Nutritional Needs:   Kcal:  2200-2400  Protein:  110-120 grams  Fluid:  2 L/day (25 mL/kg IBW)  Willey Blade, MS, RD, LDN Office: [phone] Pager: [phone] After Hours/Weekend Pager: [phone]

## 2017-10-30 NOTE — Progress Notes (Signed)
[name] NAME: Tyler Henderson    MR#:  [id number]  DATE OF BIRTH:  1946/02/23  SUBJECTIVE:  CHIEF COMPLAINT:   Chief Complaint  Patient presents with  . Weakness   - family at bedside, very upset about his recent discharge home. Patient has been extremely weak, myalgias and back pain and unable to even get out of bed. -Complains of constipation and gas pains  REVIEW OF SYSTEMS:  Review of Systems  Constitutional: Positive for malaise/fatigue. Negative for chills and fever.  HENT: Negative for congestion, ear discharge, hearing loss and nosebleeds.   Eyes: Negative for blurred vision and double vision.  Respiratory: Negative for cough, shortness of breath and wheezing.   Cardiovascular: Positive for leg swelling. Negative for chest pain and palpitations.  Gastrointestinal: Positive for abdominal pain and constipation. Negative for diarrhea, nausea and vomiting.  Genitourinary: Negative for dysuria.  Musculoskeletal: Positive for back pain and myalgias.  Neurological: Positive for weakness. Negative for dizziness, speech change, focal weakness, seizures and headaches.  Psychiatric/Behavioral: Negative for depression.    DRUG ALLERGIES:  No Known Allergies  VITALS:  Blood pressure (!) 124/53, pulse (!) 102, temperature (!) 97.5 F (36.4 C), temperature source Oral, resp. rate (!) 22, height 6\' 1"  (1.854 m), weight (!) 154.7 kg (341 lb), SpO2 93 %.  PHYSICAL EXAMINATION:  Physical Exam  GENERAL:  72 y.o.-year-old obese patient lying in the bed with no acute distress.  EYES: Pupils equal, round, reactive to light and accommodation. No scleral icterus. Extraocular muscles intact.  HEENT: Head atraumatic, normocephalic. Oropharynx and nasopharynx clear.  NECK:  Supple, no jugular venous distention. No thyroid enlargement, no tenderness.  LUNGS: Normal breath sounds bilaterally, no wheezing, rales,rhonchi or crepitation. No  use of accessory muscles of respiration.  CARDIOVASCULAR: S1, S2 normal. No murmurs, rubs, or gallops.  ABDOMEN: Soft, distended but non tender. Bowel sounds present. No organomegaly or mass.  EXTREMITIES: No  cyanosis, or clubbing. 1-2+ pedal edema noted. NEUROLOGIC: Cranial nerves II through XII are intact. Muscle strength 5/5 in both upper extremities and 3/5 in lower extremities. Sensation intact. Gait not checked.  PSYCHIATRIC: The patient is alert and oriented x 3.  SKIN: No obvious rash, lesion, or ulcer.    LABORATORY PANEL:   CBC Recent Labs  Lab 10/29/17 1218  WBC 8.6  HGB 8.1*  HCT 25.5*  PLT 252   ------------------------------------------------------------------------------------------------------------------  Chemistries  Recent Labs  Lab 10/29/17 1218  NA 131*  K 4.9  CL 105  CO2 19*  GLUCOSE 241*  BUN 99*  CREATININE 3.37*  CALCIUM 8.5*  AST 22  ALT 23  ALKPHOS 188*  BILITOT 0.5   ------------------------------------------------------------------------------------------------------------------  Cardiac Enzymes No results for input(s): TROPONINI in the last 168 hours. ------------------------------------------------------------------------------------------------------------------  RADIOLOGY:  Dg Chest Port 1 View  Result Date: 10/29/2017 CLINICAL DATA:  72 year old male with increased weakness and disorientation EXAM: PORTABLE CHEST 1 VIEW COMPARISON:  Prior chest x-ray 08/26/2010; prior PET-CT 09/21/2017 FINDINGS: Right IJ approach single-lumen port catheter. The catheter tip overlies the right innominate vein. Cardiomegaly. Mild pulmonary vascular congestion without overt edema. Large left upper lobe pulmonary nodule as seen on prior PET-CT. No acute osseous abnormality. No pleural effusion or pneumothorax. IMPRESSION: 1. Cardiomegaly and pulmonary vascular congestion without overt edema. 2. Right IJ approach single-lumen port catheter with the tip  overlying the innominate vein. 3. Left upper lobe pulmonary mass as seen on prior PET-CT. Electronically Signed  By: Jacqulynn Cadet M.D.   On: 10/29/2017 11:44    EKG:   Orders placed or performed during the hospital encounter of 10/29/17  . ED EKG  . ED EKG    ASSESSMENT AND PLAN:   72 year old male with recent diagnosis of stage IV metastatic melanoma with liver, lung and spine medicine stasis,CK D stage IV, recent urinary retention with a Foley catheter, back pain and diabetes comes to hospital secondary to weakness.  1. Generalized weakness-physical therapy reevaluation, last week they have recommended to go home but patient unable to go due this pain and weakness. -Family hoping to go rehabilitation  2. Stage IV metastatic melanoma- metastases to liver, lung and bone - oncology f/u - radiation to back lesions- for pain control and also palliative immunotherapy - radiation oncology consult for starting radiation  3. Acute urinary retention- s/p foley now - voiding trial in 2 days  4. CKD stage 4- monitor, nephrology consulted per family request Creatinine improved last week after foley placement  5. Back pain- from metastatic disease- pharmacy to help with pain regimen per new protocol - also add laxatives with the same  6. DM- lantus and SSI  7. DVT Prophylaxis- sq heparin  Palliative care consulted   All the records are reviewed and case discussed with Care Management/Social Workerr. Management plans discussed with the patient, family and they are in agreement.  CODE STATUS: Full code  TOTAL TIME TAKING CARE OF THIS PATIENT: 45 minutes.   POSSIBLE D/C IN 2-3 DAYS, DEPENDING ON CLINICAL CONDITION.   Gladstone Lighter M.D on 10/30/2017 at 12:14 PM  Between 7am to 6pm - Pager - [phone]  After 6pm go to [email or web address] - password [email or web address] [place] [facility]  Office  [phone]  CC: Primary care physician; Sofie Hartigan, MD

## 2017-10-31 ENCOUNTER — Ambulatory Visit
Admit: 2017-10-31 | Discharge: 2017-10-31 | Disposition: A | Discharge status: Home / Self Care | Payer: Medicare Other | Attending: Radiation Oncology | Admitting: Radiation Oncology

## 2017-10-31 DIAGNOSIS — C787 Secondary malignant neoplasm of liver and intrahepatic bile duct: Secondary | ICD-10-CM | POA: Insufficient documentation

## 2017-10-31 DIAGNOSIS — C7951 Secondary malignant neoplasm of bone: Secondary | ICD-10-CM

## 2017-10-31 DIAGNOSIS — C7800 Secondary malignant neoplasm of unspecified lung: Secondary | ICD-10-CM

## 2017-10-31 DIAGNOSIS — R112 Nausea with vomiting, unspecified: Secondary | ICD-10-CM | POA: Diagnosis not present

## 2017-10-31 DIAGNOSIS — N179 Acute kidney failure, unspecified: Secondary | ICD-10-CM | POA: Diagnosis not present

## 2017-10-31 DIAGNOSIS — Z515 Encounter for palliative care: Secondary | ICD-10-CM | POA: Diagnosis not present

## 2017-10-31 DIAGNOSIS — F419 Anxiety disorder, unspecified: Secondary | ICD-10-CM

## 2017-10-31 DIAGNOSIS — R739 Hyperglycemia, unspecified: Secondary | ICD-10-CM

## 2017-10-31 DIAGNOSIS — R52 Pain, unspecified: Secondary | ICD-10-CM

## 2017-10-31 DIAGNOSIS — C799 Secondary malignant neoplasm of unspecified site: Secondary | ICD-10-CM | POA: Diagnosis not present

## 2017-10-31 DIAGNOSIS — Z510 Encounter for antineoplastic radiation therapy: Secondary | ICD-10-CM | POA: Insufficient documentation

## 2017-10-31 DIAGNOSIS — C439 Malignant melanoma of skin, unspecified: Secondary | ICD-10-CM | POA: Diagnosis present

## 2017-10-31 DIAGNOSIS — G893 Neoplasm related pain (acute) (chronic): Secondary | ICD-10-CM | POA: Diagnosis not present

## 2017-10-31 DIAGNOSIS — R531 Weakness: Secondary | ICD-10-CM | POA: Diagnosis not present

## 2017-10-31 LAB — BASIC METABOLIC PANEL
ANION GAP: 7 (ref 5–15)
BUN: 83 mg/dL — ABNORMAL HIGH (ref 6–20)
CALCIUM: 8.7 mg/dL — AB (ref 8.9–10.3)
CO2: 19 mmol/L — ABNORMAL LOW (ref 22–32)
Chloride: 111 mmol/L (ref 101–111)
Creatinine, Ser: 2.42 mg/dL — ABNORMAL HIGH (ref 0.61–1.24)
GFR, EST AFRICAN AMERICAN: 29 mL/min — AB (ref >60)
GFR, EST NON AFRICAN AMERICAN: 25 mL/min — AB (ref >60)
Glucose, Bld: 145 mg/dL — ABNORMAL HIGH (ref 65–99)
Potassium: 4.9 mmol/L (ref 3.5–5.1)
SODIUM: 137 mmol/L (ref 135–145)

## 2017-10-31 LAB — GLUCOSE, CAPILLARY
Glucose-Capillary: 182 mg/dL — ABNORMAL HIGH (ref 65–99)
Glucose-Capillary: 212 mg/dL — ABNORMAL HIGH (ref 65–99)
Glucose-Capillary: 197 mg/dL — ABNORMAL HIGH (ref 65–99)
Glucose-Capillary: 171 mg/dL — ABNORMAL HIGH (ref 65–99)

## 2017-10-31 MED ORDER — BISACODYL 5 MG PO TBEC: 5.0000 mg | DELAYED_RELEASE_TABLET | Freq: Once | ORAL | Status: DC

## 2017-10-31 MED ORDER — ENSURE ENLIVE PO LIQD
237.0000 mL | Freq: Two times a day (BID) | ORAL | Status: DC
  Administered 2017-10-31 – 2017-11-03 (×7): 237 mL via ORAL

## 2017-10-31 MED ORDER — LACTULOSE 10 GM/15ML PO SOLN
20.0000 g | Freq: Two times a day (BID) | ORAL | Status: DC | PRN
  Administered 2017-10-31: 20 g via ORAL
  Filled 2017-10-31: qty 30

## 2017-10-31 NOTE — Consult Note (Signed)
Tyler Henderson  [phone] [phone]  ID: Rolene Course OB: Oct 20, 1945  MR#: [id number]  JQB#:[id number]  Patient Care Team: Sofie Hartigan, MD as PCP - General (Family Medicine)  CHIEF COMPLAINT: Stage IV melanoma, progressive weakness and fatigue, nausea and vomiting, declining performance status.  INTERVAL HISTORY: Patient is a 72 year old male who was last seen  in clinic on October 27, 2017.  He received his first infusion of combination immunotherapy 1 week prior.  He has had a declining performance status and weakness and fatigue to the point where he cannot get out of bed by himself.  He has a poor appetite and has increased nausea and vomiting.  Patient was evaluated in radiation oncology after his XRT treatment to his back.  He had an episode of emesis just prior to treatment.  He otherwise feels well.  He does not complain of pain.  He has no neurologic complaints.  He denies any recent fevers.  He has no chest pain or shortness of breath.  He denies any constipation or diarrhea.  He continues to have a Foley catheter for urinary retention.  Patient offers no further specific complaints.  REVIEW OF SYSTEMS:   Review of Systems  Constitutional: Positive for malaise/fatigue. Negative for fever and weight loss.  Respiratory: Negative.  Negative for cough, hemoptysis and shortness of breath.   Cardiovascular: Negative.  Negative for chest pain and leg swelling.  Gastrointestinal: Positive for nausea and vomiting. Negative for abdominal pain, blood in stool, constipation, diarrhea and melena.  Genitourinary: Negative.   Musculoskeletal: Positive for back pain.  Skin: Negative.  Negative for rash.  Neurological: Positive for weakness. Negative for sensory change and focal weakness.  Psychiatric/Behavioral: Negative.  The patient is not nervous/anxious.     As per HPI. Otherwise, a complete review of systems is negative.  PAST MEDICAL  HISTORY: Past Medical History:  Diagnosis Date  . Anxiety   . Arthritis   . Bladder cancer ([place])   . Chronic kidney disease   . Depression   . Diabetes ([place])   . Dysrhythmia    atrial fib  . GERD (gastroesophageal reflux disease)   . Hypertension   . Restless leg syndrome   . Shortness of breath dyspnea    with exertion  . Skin cancer   . Sleep apnea    CPAP "once in awhile"  . 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 APPENDECTOMY    . JOINT REPLACEMENT Left    Total Knee Replacement  . KNEE ARTHROSCOPY Left   . PORTA CATH INSERTION N/A 10/19/2017   Procedure: PORTA CATH INSERTION;  Surgeon: Algernon Huxley, MD;  Location: [facility];  Service: Cardiovascular;  Laterality: N/A;  . REPLACEMENT TOTAL KNEE Left 2012   Dr. Little Ishikawa, Jr. [facility]  . TRANSURETHRAL RESECTION OF BLADDER TUMOR N/A 03/14/2016   Procedure: TRANSURETHRAL RESECTION OF BLADDER TUMOR (TURBT);  Surgeon: Hollice Espy, MD;  Location: [facility];  Service: Urology;  Laterality: N/A;    FAMILY HISTORY: Family History  Problem Relation Age of Onset  . Cancer Mother        Breast  . Heart failure Father   . Prostate cancer Neg Hx   . Bladder Cancer Neg Hx     ADVANCED DIRECTIVES (Y/N):  @ADVDIR @  HEALTH MAINTENANCE: Social History   Tobacco Use  . Smoking status: Former Smoker    Packs/day:  1.00    Years: 20.00    Pack years: 20.00    Types: Cigarettes    Last attempt to quit: 1979    Years since quitting: 40.2  . Smokeless tobacco: Never Used  Substance Use Topics  . Alcohol use: Yes    Alcohol/week: 1.2 oz    Types: 2 Cans of beer per week    Comment: occassional  . Drug use: No     Colonoscopy:  PAP:  Bone density:  Lipid panel:  No Known Allergies  Current Facility-Administered Medications  Medication Dose Route Frequency Provider Last Rate Last Dose  . acetaminophen (TYLENOL) tablet 650 mg  650 mg  Oral Q6H PRN Fritzi Mandes, MD   650 mg at 10/30/17 1029   Or  . acetaminophen (TYLENOL) suppository 650 mg  650 mg Rectal Q6H PRN Fritzi Mandes, MD      . acetaminophen (TYLENOL) tablet 650 mg  650 mg Oral Q12H Pernell Dupre, RPH   650 mg at 10/31/17 2051  . ALPRAZolam Duanne Moron) tablet 0.5 mg  0.5 mg Oral QHS PRN Lance Coon, MD   0.5 mg at 10/31/17 2051  . bisacodyl (DULCOLAX) EC tablet 5 mg  5 mg Oral Daily PRN Pernell Dupre, RPH      . feeding supplement (ENSURE ENLIVE) (ENSURE ENLIVE) liquid 237 mL  237 mL Oral BID BM Candiss Norse, Harmeet, MD   237 mL at 10/31/17 1306  . feeding supplement (GLUCERNA SHAKE) (GLUCERNA SHAKE) liquid 237 mL  237 mL Oral BID BM Gladstone Lighter, MD   237 mL at 10/31/17 1307  . FLUoxetine (PROZAC) capsule 10 mg  10 mg Oral Daily Lance Coon, MD   10 mg at 10/31/17 0926  . heparin injection 5,000 Units  5,000 Units Subcutaneous Q8H Fritzi Mandes, MD   5,000 Units at 10/31/17 2051  . insulin aspart (novoLOG) injection 0-9 Units  0-9 Units Subcutaneous TID WC Fritzi Mandes, MD   2 Units at 10/31/17 1651  . insulin glargine (LANTUS) injection 5 Units  5 Units Subcutaneous Daily Gladstone Lighter, MD   5 Units at 10/31/17 0929  . lactulose (CHRONULAC) 10 GM/15ML solution 20 g  20 g Oral BID PRN Gladstone Lighter, MD   20 g at 10/31/17 1203  . ondansetron (ZOFRAN) tablet 4 mg  4 mg Oral Q6H PRN Fritzi Mandes, MD       Or  . ondansetron [facility]) injection 4 mg  4 mg Intravenous Q6H PRN Fritzi Mandes, MD   4 mg at 10/31/17 1526  . oxyCODONE (Oxy IR/ROXICODONE) immediate release tablet 10 mg  10 mg Oral Q4H PRN Pernell Dupre, RPH   10 mg at 10/31/17 2051  . oxyCODONE (Oxy IR/ROXICODONE) immediate release tablet 5 mg  5 mg Oral Q4H PRN Hallaji, Sheema M, RPH      . polyethylene glycol (MIRALAX / GLYCOLAX) packet 17 g  17 g Oral Daily Hallaji, Sheema M, RPH   17 g at 10/31/17 0928  . senna-docusate (Senokot-S) tablet 1 tablet  1 tablet Oral BID Pernell Dupre, RPH   1  tablet at 10/31/17 2051  . simethicone (MYLICON) chewable tablet 80 mg  80 mg Oral QID PRN Harrie Foreman, MD   80 mg at 10/30/17 1235    OBJECTIVE: Vitals:   10/31/17 1406 10/31/17 2008  BP: (!) 142/59 107/74  Pulse: (!) 109 93  Resp: 20 18  Temp: 98 F (36.7 C) 98 F (36.7 C)  SpO2: 93% 98%  Body mass index is 43.98 kg/m.    ECOG FS:4 - Bedbound  General: Ill-appearing, no acute distress. Eyes: Pink conjunctiva, anicteric sclera. HEENT: Normocephalic, moist mucous membranes, clear oropharnyx. Lungs: Clear to auscultation bilaterally. Heart: Regular rate and rhythm. No rubs, murmurs, or gallops. Abdomen: Soft, nontender, nondistended. No organomegaly noted, normoactive bowel sounds. Musculoskeletal: No edema, cyanosis, or clubbing. Neuro: Alert, answering all questions appropriately. Cranial nerves grossly intact. Skin: No rashes or petechiae noted. Psych: Normal affect.  LAB RESULTS:  Lab Results  Component Value Date   NA 137 10/31/2017   K 4.9 10/31/2017   CL 111 10/31/2017   CO2 19 (L) 10/31/2017   GLUCOSE 145 (H) 10/31/2017   BUN 83 (H) 10/31/2017   CREATININE 2.42 (H) 10/31/2017   CALCIUM 8.7 (L) 10/31/2017   PROT 5.8 (L) 10/29/2017   ALBUMIN 2.1 (L) 10/29/2017   AST 22 10/29/2017   ALT 23 10/29/2017   ALKPHOS 188 (H) 10/29/2017   BILITOT 0.5 10/29/2017   GFRNONAA 25 (L) 10/31/2017   GFRAA 29 (L) 10/31/2017    Lab Results  Component Value Date   WBC 8.6 10/29/2017   NEUTROABS 5.8 10/27/2017   HGB 8.1 (L) 10/29/2017   HCT 25.5 (L) 10/29/2017   MCV 83.0 10/29/2017   PLT 252 10/29/2017     STUDIES: Dg Lumbar Spine Complete W/bend  Result Date: 10/24/2017 CLINICAL DATA:  72 y/o  M; metastatic melanoma to lumbar spine. EXAM: LUMBAR SPINE - COMPLETE WITH BENDING VIEWS COMPARISON:  10/24/2017 lumbar spine MRI. FINDINGS: Five lumbar type non-rib-bearing vertebral bodies. Straightening of lumbar lordosis without listhesis. No dynamic instability on  flexion and extension views. Mild loss of L3-4 and L5-S1 intervertebral disc space height. Multilevel small anterior marginal osteophytes. Metastatic lesions within the L4 vertebral body and paraspinal muscles prior lumbar MRI poorly visualized. IMPRESSION: 1.  No acute fracture or dislocation identified. 2. Lumbar spondylosis with loss of disc space height at L3-4 and L5-S1. 3. No dynamic instability. Electronically Signed   By: Kristine Garbe M.D.   On: 10/24/2017 16:10   Dg Abdomen 1 View  Result Date: 10/23/2017 CLINICAL DATA:  Bladder carcinoma.  Pain and inability to urinate EXAM: ABDOMEN - 1 VIEW COMPARISON:  PET-CT September 21, 2017 FINDINGS: There is moderate stool throughout the colon. There is no bowel dilatation or air-fluid level to suggest bowel obstruction. No free air. There are probable phleboliths in the pelvis. Urinary bladder does not appear distended by radiography. IMPRESSION: No bowel obstruction or free air. Urinary bladder does not appear distended by radiography. Electronically Signed   By: Lowella Grip III M.D.   On: 10/23/2017 10:51   Mr Jeri Cos ER Contrast  Result Date: 10/11/2017 CLINICAL DATA:  Bladder cancer with metastatic disease to the liver, skeleton, and lungs. EXAM: MRI HEAD WITHOUT AND WITH CONTRAST TECHNIQUE: Multiplanar, multiecho pulse sequences of the brain and surrounding structures were obtained without and with intravenous contrast. CONTRAST:  2m MULTIHANCE GADOBENATE DIMEGLUMINE 529 MG/ML IV SOLN COMPARISON:  PET scan 09/21/2017. FINDINGS: Brain: No focal enhancing lesions are present to suggest metastatic disease the brain or meninges. Minimal white matter changes are within normal limits for age. No acute infarct, hemorrhage, or mass lesion is present. Ventricles are of normal size. The internal auditory canals are within normal limits bilaterally. The brainstem and cerebellum are normal. No significant extra-axial fluid collection is present.  Vascular: Flow is present in the major intracranial arteries. Globes and orbits are at that Skull and upper  cervical spine: The calvarium is intact. Focal lesions or enhancement are evident. The skull base is within normal limits. The craniocervical junction is normal. Sinuses/Orbits: Mild mucosal thickening is present along the floor of the maxillary sinuses bilaterally. No fluid levels are present. The paranasal sinuses and mastoid air cells are otherwise clear. Globes and orbits are within normal limits. IMPRESSION: 1. No evidence for metastatic disease to the brain or meninges. 2. Normal MRI appearance of the brain for age. 3. Minimal maxillary sinus disease. Electronically Signed   By: San Morelle M.D.   On: 10/11/2017 11:54   Mr Lumbar Spine Wo Contrast  Result Date: 10/24/2017 CLINICAL DATA:  Difficulty urinating for 3 days. History of metastatic melanoma, bladder cancer and chronic kidney disease. EXAM: MRI LUMBAR SPINE WITHOUT CONTRAST TECHNIQUE: Multiplanar, multisequence MR imaging of the lumbar spine was performed. No intravenous contrast was administered. COMPARISON:  PET CT September 21, 2017 FINDINGS: SEGMENTATION: For the purposes of this report, the last well-formed intervertebral disc is reported as L5-S1. ALIGNMENT: Maintained lumbar lordosis. No malalignment. VERTEBRAE:30 mm low T1, bright STIR signal mass RIGHT L4 vertebral body extending to the pedicle with mild acute fracture, less than 15% height loss superior endplate associated with Schmorl's node. Low T1, bright STIR signal RIGHT T12 facet consistent with metastasis. Ovoid subcentimeter low T1, bright STIR signal inferior to LEFT L2 pedicle, likely extra osseous cyst. Remaining vertebral bodies intact. Congenital canal narrowing on the basis of foreshortened pedicles. Moderate to severe L5-S1 disc height loss with mild chronic discogenic endplate changes. Mild disc desiccation L2-3 through L5-S1. CONUS MEDULLARIS AND CAUDA  EQUINA: Conus medullaris terminates at L1-2 and demonstrates normal morphology and signal characteristics. Cauda equina is normal. PARASPINAL AND OTHER SOFT TISSUES: T2 bright cyst LEFT kidney. Mild bright interstitial STIR signal bilateral paraspinal muscles seen with low-grade strain. 9 mm round cystic lesion RIGHT paraspinal soft tissues fluid fluid level. At least 4.8 cm mass contiguous with RIGHT sacroiliac joint undermining the ileo psoas muscles. DISC LEVELS: L1-2, L2-3: No disc bulge, canal stenosis nor neural foraminal narrowing. Mild facet arthropathy. L3-4: RIGHT L4 osseous metastasis with a 9 x 7 mm tumoral extension to RIGHT lateral recess displacing the traversing RIGHT L4 nerve. Small broad-based disc bulge and mild facet arthropathy. Mild canal stenosis. Mild bilateral neural foraminal narrowing. L4-5: Small broad-based disc bulge, annular fissure. Mild facet arthropathy and ligamentum flavum redundancy. Moderate canal stenosis. Moderate RIGHT, mild LEFT neural foraminal narrowing. L5-S1: Moderate broad-based disc bulge. Mild facet arthropathy and ligamentum flavum redundancy. No canal stenosis. Mild to moderate bilateral neural foraminal narrowing. IMPRESSION: 1. RIGHT L4 metastasis with mild pathologic fracture, tumor expansion into epidural space resulting in traversing RIGHT L4 nerve impingement. RIGHT T12 facet metastasis. 2. 2 subcentimeter cystic paraspinal muscle masses concerning for metastatic deposits. At least 4.8 cm RIGHT pelvic metastasis. 3. 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 Moderate canal stenosis L4-5, mild canal stenosis L3-4. 4. Neural foraminal narrowing L3-4 through L5-S1: Moderate on the RIGHT at L4-5. Electronically Signed   By: Elon Alas M.D.   On: 10/24/2017 01:31   US Renal  Result Date: 10/23/2017 CLINICAL DATA:  Acute renal injury, history of metastatic bladder carcinoma EXAM: RENAL / URINARY TRACT ULTRASOUND  COMPLETE COMPARISON:  07/14/2017. FINDINGS: Right Kidney: Length: 11.7 cm. Echogenicity within normal limits. No mass or hydronephrosis visualized. Left Kidney: Length: 12.1 cm. Cysts are noted within the left kidney. The largest of these measures 9.1 cm in greatest dimension. These  are stable when compared with prior CT examination. No hydronephrosis is noted. Bladder: Appears normal for degree of bladder distention. Note is made of known lesions within the right lobe of the liver. IMPRESSION: Left renal cystic change. Stable masses in the liver are noted. Electronically Signed   By: Inez Catalina M.D.   On: 10/23/2017 15:24   US Biopsy (liver)  Result Date: 10/06/2017 INDICATION: Concern for metastatic lung cancer, now with multiple hypermetabolic liver lesions. Please perform ultrasound-guided liver lesion biopsy for tissue diagnostic purposes. EXAM: ULTRASOUND GUIDED LIVER LESION BIOPSY COMPARISON:  PET-CT - 09/21/2017 MEDICATIONS: None ANESTHESIA/SEDATION: Fentanyl 50 mcg IV; Versed 2 mg IV Total Moderate Sedation time:  14 Minutes. The patient's level of consciousness and vital signs were monitored continuously by radiology nursing throughout the procedure under my direct supervision. COMPLICATIONS: None immediate. PROCEDURE: Informed written consent was obtained from the patient after a discussion of the risks, benefits and alternatives to treatment. The patient understands and consents the procedure. A timeout was performed prior to the initiation of the procedure. Ultrasound scanning was performed of the right upper abdominal quadrant demonstrates multiple mixed echogenic lesions and masses within both the right and left lobes of the liver. An approximately 3.2 x 2.5 cm hypoechoic lesion/mass within the medial segment of left lobe liver correlating with the hypoattenuating lesion seen on preceding PET-CT image 153, series 4, was targeted for biopsy given lesion location and sonographic window. The  procedure was planned. The right upper abdominal quadrant was prepped and draped in the usual sterile fashion. The overlying soft tissues were anesthetized with 1% lidocaine with epinephrine. A 17 gauge, 6.8 cm co-axial needle was advanced into a peripheral aspect of the lesion. This was followed by 5 core biopsies with an 18 gauge core device under direct ultrasound guidance. The coaxial needle tract was embolized with a small amount of Gel-Foam slurry and superficial hemostasis was obtained with manual compression. Post procedural scanning was negative for definitive area of hemorrhage or additional complication. A dressing was placed. The patient tolerated the procedure well without immediate post procedural complication. IMPRESSION: Technically successful ultrasound guided core needle biopsy of indeterminate lesion/mass within the medial segment of the left lobe of the liver. Electronically Signed   By: [name] Mariscal M.D.   On: 10/06/2017 12:02   Dg Chest Port 1 View  Result Date: 10/29/2017 CLINICAL DATA:  72 year old male with increased weakness and disorientation EXAM: PORTABLE CHEST 1 VIEW COMPARISON:  Prior chest x-ray 08/26/2010; prior PET-CT 09/21/2017 FINDINGS: Right IJ approach single-lumen port catheter. The catheter tip overlies the right innominate vein. Cardiomegaly. Mild pulmonary vascular congestion without overt edema. Large left upper lobe pulmonary nodule as seen on prior PET-CT. No acute osseous abnormality. No pleural effusion or pneumothorax. IMPRESSION: 1. Cardiomegaly and pulmonary vascular congestion without overt edema. 2. Right IJ approach single-lumen port catheter with the tip overlying the innominate vein. 3. Left upper lobe pulmonary mass as seen on prior PET-CT. Electronically Signed   By: Jacqulynn Cadet M.D.   On: 10/29/2017 11:44    ASSESSMENT: Stage IV melanoma, progressive weakness and fatigue, nausea and vomiting, declining performance status.  PLAN:   1. Stage IV  metastatic melanoma with liver, lung, and bony metastasis: PET scan results from September 13, 2017 reviewed independently with widespread metastatic disease.  MRI of the brain negative for disease. Liver biopsy consistent with metastatic melanoma. BRAF mutation has been ordered and is pending at time of dictation.  Patient received his first infusion  of combination immunotherapy using nivolumab and ipilimumab on October 20, 2017.  His next scheduled infusion is November 09, 2017.  Patient has been instructed to keep this appointment as scheduled.  2.  Acute renal failure: Patient's creatinine is slowly improving.  Likely multifactorial including urinary retention as well as dehydration.  He continues to have a Foley catheter in place.   2.  Anxiety: Continue Xanax of 0.5 mg twice per day as needed. 3.  Pain: Continue oxycodone 10 mg tabs as needed.  Continue daily XRT as scheduled. 4.  Hyperglycemia: Improved continue current medications as prescribed. 5.  Nausea and vomiting: Continue Zofran as ordered. 6.  Declining performance status: Appreciate palliative care input.  Previously hospice was discussed, the patient wished to pursue palliative combination immunotherapy as above.  He likely will require placement and temporary rehab upon discharge.  Continue to monitor and follow-up as above.  Appreciate consult, will follow.    Cancer Staging Metastatic melanoma [facility]) Staging form: Melanoma of the Skin, AJCC 8th Edition - Clinical stage from 10/15/2017: Stage IV (cTX, cNX, pM1c(1)) - Signed by Lloyd Huger, MD on 10/15/2017   Lloyd Huger, MD   10/31/2017 10:27 PM

## 2017-10-31 NOTE — Plan of Care (Signed)

## 2017-10-31 NOTE — Progress Notes (Signed)
PT Cancellation Note  Patient Details Name: Tyler Henderson MRN: [id number] DOB: [birth year]   Cancelled Treatment:    Reason Eval/Treat Not Completed: Patient at procedure or test/unavailable(Patient remains off unit for radiation treatment. Noted consult from neurosurgery clearing patient for activity with brace donned.  Will continue efforts as patient available.)   [name] H. Owens Shark, PT, DPT, NCS 10/31/17, 3:14 PM [phone]

## 2017-10-31 NOTE — Progress Notes (Signed)
Patient ID: Tyler Henderson, male   DOB: [birth year], 72 y.o.   MRN: [id number]  This NP visited patient at the bedside as a follow up to  yesterday's [place] for palliative medicine needs and emotional support.  Continued conversation regarding diagnosis and treatment plan.  The wife tells me that some things have fallen into place; radiation will begin today and  trial for urinary retention tomorrow.  Patient's wife verbalizes frustration with the limited care options not only for rehabilitation but need for transportation for his radiation treatments.  Social work is working on options.  With patient's wife and daughter verbalize frustration with overall health care system as a whole.  It is hard for them to understand why there are not more services for patients in their homes and better options for facilities for rehabilitation.  Emotional support offered, questions and concerns addressed.  Patient and family are open to all offered and available medical interventions to prolong life at this time .  I discussed with family the importance of continued conversation with  their  medical providers regarding overall plan of care and treatment options,  ensuring decisions are within the context of the patients values and GOCs specifically into the future and dependant on patient's response and outcomes to treatment.     Discussed with Dr Tressia Miners     Palliative medicine team will continue to support holistically  Time in  1045         Time out  1130  Total time spent on the unit was 45 minutes  Greater than 50% of the time was spent in counseling and coordination of care  Wadie Lessen NP  Palliative Medicine Team Team Phone # [phone] Pager [phone]

## 2017-10-31 NOTE — Progress Notes (Addendum)
[facility] [place] NAME: Tyler Henderson    MR#:  [id number]  DATE OF BIRTH:  01/24/1946  SUBJECTIVE:  CHIEF COMPLAINT:   Chief Complaint  Patient presents with  . Weakness   -Radiation to be started today for his metastatic spine disease.  Pain is better controlled -Still constipated.  Family concerned about him being able to go to rehab -Multiple questions answered at bedside  REVIEW OF SYSTEMS:  Review of Systems  Constitutional: Positive for malaise/fatigue. Negative for chills and fever.  HENT: Negative for congestion, ear discharge, hearing loss and nosebleeds.   Eyes: Negative for blurred vision and double vision.  Respiratory: Negative for cough, shortness of breath and wheezing.   Cardiovascular: Positive for leg swelling. Negative for chest pain and palpitations.  Gastrointestinal: Positive for abdominal pain and constipation. Negative for diarrhea, nausea and vomiting.  Genitourinary: Negative for dysuria.  Musculoskeletal: Positive for back pain and myalgias.  Neurological: Positive for weakness. Negative for dizziness, speech change, focal weakness, seizures and headaches.  Psychiatric/Behavioral: Negative for depression.    DRUG ALLERGIES:  No Known Allergies  VITALS:  Blood pressure (!) 150/92, pulse (!) 105, temperature 97.8 F (36.6 C), temperature source Oral, resp. rate (!) 22, height 6\' 1"  (1.854 m), weight (!) 151.2 kg (333 lb 5.4 oz), SpO2 93 %.  PHYSICAL EXAMINATION:  Physical Exam  GENERAL:  72 y.o.-year-old obese patient lying in the bed with no acute distress.  EYES: Pupils equal, round, reactive to light and accommodation. No scleral icterus. Extraocular muscles intact.  HEENT: Head atraumatic, normocephalic. Oropharynx and nasopharynx clear.  NECK:  Supple, no jugular venous distention. No thyroid enlargement, no tenderness.  LUNGS: Normal breath sounds bilaterally, no wheezing, rales,rhonchi or  crepitation. No use of accessory muscles of respiration.  CARDIOVASCULAR: S1, S2 normal. No murmurs, rubs, or gallops.  ABDOMEN: Soft, distended but non tender. Bowel sounds present. No organomegaly or mass.  EXTREMITIES: No  cyanosis, or clubbing. 1-2+ pedal edema noted. NEUROLOGIC: Cranial nerves II through XII are intact. Muscle strength 5/5 in both upper extremities and 3/5 in lower extremities. Sensation intact. Gait not checked.  PSYCHIATRIC: The patient is alert and oriented x 3.  SKIN: No obvious rash, lesion, or ulcer.    LABORATORY PANEL:   CBC Recent Labs  Lab 10/29/17 1218  WBC 8.6  HGB 8.1*  HCT 25.5*  PLT 252   ------------------------------------------------------------------------------------------------------------------  Chemistries  Recent Labs  Lab 10/29/17 1218 10/31/17 0500  NA 131* 137  K 4.9 4.9  CL 105 111  CO2 19* 19*  GLUCOSE 241* 145*  BUN 99* 83*  CREATININE 3.37* 2.42*  CALCIUM 8.5* 8.7*  AST 22  --   ALT 23  --   ALKPHOS 188*  --   BILITOT 0.5  --    ------------------------------------------------------------------------------------------------------------------  Cardiac Enzymes No results for input(s): TROPONINI in the last 168 hours. ------------------------------------------------------------------------------------------------------------------  RADIOLOGY:  No results found.  EKG:   Orders placed or performed during the hospital encounter of 10/29/17  . ED EKG  . ED EKG    ASSESSMENT AND PLAN:   72 year old male with recent diagnosis of stage IV metastatic melanoma with liver, lung and spine medicine stasis,CK D stage IV, recent urinary retention with a Foley catheter, back pain and diabetes comes to hospital secondary to weakness.  1. Generalized weakness-physical therapy reevaluation, last week they have recommended to go home but patient unable to remain at home due this pain  and weakness. -Patient is extremely weak,  unable to get out of bed without any support.  Will not be a safe discharge to home at this point  2. Stage IV metastatic melanoma- metastases to liver, lung and bone - oncology f/u - radiation to back lesions- for pain control and also palliative immunotherapy started last week. - radiation oncology consult for starting radiation-and to be started today -Discussed with his oncologist as well  3. Acute urinary retention- s/p foley now - voiding tomorrow  4. CKD stage 4- monitor, nephrology consulted per family request Creatinine improved last week after foley placement  -Urine and creatinine still improving  5. Back pain- from metastatic spine disease-  -Appreciate neurosurgery consult.  Patient has to put his back brace on before he can work with physical therapy -Pain is better controlled - also add laxatives prevent constipation while on pain medication  6. DM- lantus and SSI  7. DVT Prophylaxis- sq heparin  Palliative care consulted Continue current treatment.  Physical therapy consult Wife updated at bedside   All the records are reviewed and case discussed with Care Management/Social Workerr. Management plans discussed with the patient, family and they are in agreement.  CODE STATUS: DNR per palliative care discussion  TOTAL TIME TAKING CARE OF THIS PATIENT: 45 minutes.   POSSIBLE D/C IN 1-2 DAYS, DEPENDING ON CLINICAL CONDITION.   Gladstone Lighter M.D on 10/31/2017 at 1:17 PM  Between 7am to 6pm - Pager - [phone]  After 6pm go to [email or web address] - password [email or web address] [facility]  Office  [phone]  CC: Primary care physician; Sofie Hartigan, MD

## 2017-10-31 NOTE — Progress Notes (Signed)
Per palliative team [facility] cancer center wheel chair Lucianne Lei will transport patient every day from the SNF facility to the [facility] cancer center for radiation. Clinical Education officer, museum (CSW) contacted [employer] at [facility] and made her aware of above and asked her to re-consider the referral.  Per [employer] can make a bed offer as long as the cancer center Lucianne Lei transports. Per Claiborne Billings she will start [facility] SNF authorization. CSW left a voicemail for the [facility] to verify transportation.   CSW met with patient and his wife Blanch Media was at bedside and made them aware of above. They accepted bed offer from [facility]. CSW will continue to follow and assist as needed.   [employer], LCSW [phone]

## 2017-10-31 NOTE — Progress Notes (Signed)
PT Cancellation Note  Patient Details Name: Tyler Henderson MRN: [id number] DOB: 03-Apr-1946   Cancelled Treatment:    Reason Eval/Treat Not Completed: Medical issues which prohibited therapy(Neuro consult remains pending.  Will continue to follow and initiate once bracing, mobility restrictions clarified in light of progressive weakness and known metastatic disease/pathologic compression fracture.)   Carston Riedl H. Owens Shark, PT, DPT, NCS 10/31/17, 11:41 AM [phone]

## 2017-10-31 NOTE — Progress Notes (Signed)
I was asked to see Tyler Henderson today. He continues to have back pain when moved from bed. He has the back brace in the room and I have encouraged this whenever up. His leg strength appears full when testing in bed and I do think he is deconditioned. PT has been ordered and he is clear to work with them in the brace. Bending and lifting should be avoided but gait training and ambulation should be worked on. He is clear for this from our perspective   Deetta Perla, MD [phone] (p)

## 2017-10-31 NOTE — Progress Notes (Signed)
Per Dianna at [facility] their wheel chair Lucianne Lei will pick patient up at [facility] each day for radiation starting 11/02/17 pending discharge from [facility] at 1:45 pm. Claiborne Billings admissions coordinator at [facility] is aware of above and they will have patient in a wheel chair for transport.   [employer], LCSW [phone]

## 2017-10-31 NOTE — Progress Notes (Signed)
[facility], [place] 10/31/17  Subjective:   Patient known to our practice from outpatient follow-up.  He is seen for chronic kidney disease stage 3, diabetic nephropathy, hypertension.  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  He is currently undergoing palliative chemotherapy and is followed by Dr Grayland Ormond. He is also noted to have acute urinary retention.  A Foley catheter was placed, which has resulted in improvement in serum creatinine to 2.4 to, BUN is improved to 83 Baseline creatinine is 2.10/GFR 30   Objective:  Vital signs in last 24 hours:  Temp:  [97.7 F (36.5 C)-98.3 F (36.8 C)] 97.8 F (36.6 C) (04/09 0424) Pulse Rate:  [95-108] 105 (04/09 0424) Resp:  [20-22] 22 (04/09 0424) BP: (118-150)/(60-92) 150/92 (04/09 0424) SpO2:  [92 %-94 %] 93 % (04/09 0424) Weight:  [333 lb 5.4 oz (151.2 kg)] 333 lb 5.4 oz (151.2 kg) (04/08 1543)  Weight change: 6 lb 5.4 oz (2.874 kg) Filed Weights   10/29/17 1109 10/29/17 1704 10/30/17 1543  Weight: (!) 327 lb (148.3 kg) (!) 341 lb (154.7 kg) (!) 333 lb 5.4 oz (151.2 kg)    Intake/Output:    Intake/Output Summary (Last 24 hours) at 10/31/2017 0903 Last data filed at 10/30/2017 2120 Gross per 24 hour  Intake 360 ml  Output 500 ml  Net -140 ml     Physical Exam: General:  Laying in bed resting quietly  HEENT  anicteric, moist oral mucous membranes  Neck  supple  Pulm/lungs  normal breathing effort, clear to auscultation  CVS/Heart  soft systolic murmur, regular  Abdomen:   Obese, nontender  Extremities:  Feet in bilateral soft supports, no edema  Neurologic:  Resting quietly  Skin:  No acute rashes    Foley in place       Basic Metabolic Panel:  Recent Labs  Lab 10/25/17 0437 10/26/17 0450 10/27/17 0918 10/29/17 1218 10/31/17 0500  NA 133* 135 130* 131* 137  K 4.9 4.8 4.9 4.9 4.9  CL 105 107 102 105 111  CO2 20* 21* 18* 19* 19*  GLUCOSE 315* 149* 307* 241*  145*  BUN 84* 89* 93* 99* 83*  CREATININE 3.55* 3.40* 3.10* 3.37* 2.42*  CALCIUM 8.1* 8.3* 8.0* 8.5* 8.7*     CBC: Recent Labs  Lab 10/25/17 0437 10/26/17 1056 10/27/17 0918 10/29/17 1218  WBC 13.7*  --  7.6 8.6  NEUTROABS  --   --  5.8  --   HGB 7.8* 8.3* 7.8* 8.1*  HCT 25.1*  --  24.2* 25.5*  MCV 83.4  --  83.7 83.0  PLT 315  --  247 252     No results found for: HEPBSAG, HEPBSAB, HEPBIGM    Microbiology:  Recent Results (from the past 240 hour(s))  Urine Culture     Status: None   Collection Time: 10/29/17 12:18 PM  Result Value Ref Range Status   Specimen Description   Final    URINE, RANDOM Performed at [facility], [address], [place], [place] [address]    Special Requests   Final    NONE Performed at [facility], [address], [place], [place] [address]    Culture   Final    NO GROWTH Performed at [facility], [address], [place], [place] [address]    Report Status 10/30/2017 FINAL  Final    Coagulation Studies: No results for input(s): LABPROT, INR in the  last 72 hours.  Urinalysis: Recent Labs    10/29/17 1218  COLORURINE YELLOW*  LABSPEC 1.009  PHURINE 5.0  GLUCOSEU NEGATIVE  HGBUR MODERATE*  BILIRUBINUR NEGATIVE  KETONESUR NEGATIVE  PROTEINUR 30*  NITRITE NEGATIVE  LEUKOCYTESUR TRACE*      Imaging: Dg Chest Port 1 View  Result Date: 10/29/2017 CLINICAL DATA:  72 year old male with increased weakness and disorientation EXAM: PORTABLE CHEST 1 VIEW COMPARISON:  Prior chest x-ray 08/26/2010; prior PET-CT 09/21/2017 FINDINGS: Right IJ approach single-lumen port catheter. The catheter tip overlies the right innominate vein. Cardiomegaly. Mild pulmonary vascular congestion without overt edema. Large left upper lobe pulmonary nodule as seen on prior PET-CT. No acute osseous abnormality. No pleural effusion or pneumothorax. IMPRESSION: 1. Cardiomegaly and pulmonary vascular congestion without overt edema. 2.  Right IJ approach single-lumen port catheter with the tip overlying the innominate vein. 3. Left upper lobe pulmonary mass as seen on prior PET-CT. Electronically Signed   By: Jacqulynn Cadet M.D.   On: 10/29/2017 11:44     Medications:    . acetaminophen  650 mg Oral Q12H  . feeding supplement (GLUCERNA SHAKE)  237 mL Oral BID BM  . FLUoxetine  10 mg Oral Daily  . heparin  5,000 Units Subcutaneous Q8H  . insulin aspart  0-9 Units Subcutaneous TID WC  . insulin glargine  5 Units Subcutaneous Daily  . insulin starter kit- pen needles  1 kit Other Once  . polyethylene glycol  17 g Oral Daily  . senna-docusate  1 tablet Oral BID   acetaminophen **OR** acetaminophen, ALPRAZolam, bisacodyl, ondansetron **OR** ondansetron (ZOFRAN) IV, oxyCODONE, oxyCODONE, simethicone  Assessment/ Plan:  72 y.o. male With diabetes more than 15 years, GERD, osteoarthritis, hypertension, hyperlipidemia, GERD, obstructive sleep apnea, proteinuria, stage IV melanoma metastatic to lungs, liver, bones undergoing palliative chemotherapy by Dr. fail again.  1.  Acute renal failure on chronic kidney disease stage III Baseline creatinine is 2.10/GFR 30 from October 16, 2017 2.  Acute urinary retention Foley catheter placed this admission  Plan: Acute renal failure likely secondary to acute urinary retention. ? Related to pain meds Serum creatinine is now close to baseline Continue to monitor Voiding trial tomorrow morning    LOS: 0 Tyler Henderson 4/9/20199:03 AM  [place], [place]  Note: This note was prepared with Dragon dictation. Any transcription errors are unintentional

## 2017-10-31 NOTE — Evaluation (Addendum)
Physical Therapy Evaluation Patient Details Name: Tyler Henderson MRN: [id number] DOB: Dec 24, 1945 Today's Date: 10/31/2017   History of Present Illness  presented to ER and admitted under observation secondary to progressive weakness and difficulty managing care at home.  PMH significant for  metastatic melanoma to lungs/liver/bones, bladder CA, CKD, depression, htn, RLS, L TKR; recent L-spine MRI significant for R L4 metastasis with mild pathologic fx, tumor expansion into epidural space resulting in traversing R L4 nerve impingement.  Orders noted for TSLO brace with all mobility.  Clinical Impression  Upon evaluation, patient alert and oriented to basic information; follows simple commands, but demonstrates mild confusion to more complex instructions/information.  Bilat LEs generally weak and deconditioned (R > L), but able to move at least partial range against gravity.  Denies radicular symptoms at this time. Demonstrates ability to complete supine to sit via log-rolling technique with cga; sit/stand, basic transfers and gait (30') with RW, min assist +2 for safety.  Functional weakness noted R LE with mobility tasks, but no overt buckling or LOB.  Distance limited by fatigue (HR to 130s with exertion, recovers to 110s with seated rest). Would benefit from skilled PT to address above deficits and promote optimal return to PLOF; recommend transition to STR upon discharge from acute hospitalization.  Wife, daughter in room beginning of session; left immediately upon therapist entry to room.  Not present to observe patient performance or participate in session.     Follow Up Recommendations SNF(per family/medical team request)    Equipment Recommendations       Recommendations for Other Services       Precautions / Restrictions Precautions Precautions: Fall;Back Required Braces or Orthoses: Spinal Brace Spinal Brace Comments: with mobility Restrictions Weight Bearing Restrictions: No       Mobility  Bed Mobility Overal bed mobility: Needs Assistance Bed Mobility: Supine to Sit Rolling: Supervision   Supine to sit: Min guard     General bed mobility comments: cuing for log-rolling technique; increased time, effort required, but able to complete with very limited physical assist  Transfers Overall transfer level: Needs assistance Equipment used: Rolling walker (2 wheeled) Transfers: Sit to/from Stand Sit to Stand: Min assist;+2 physical assistance         General transfer comment: heavy use of momentum and bilat UE support to complete lift off and movement transition; requires partially elevated surface for optimal safety/indep  Ambulation/Gait Ambulation/Gait assistance: Min assist;+2 safety/equipment Ambulation Distance (Feet): 30 Feet Assistive device: Rolling walker (2 wheeled)     Gait velocity interpretation: Below normal speed for age/gender General Gait Details: broad BOS, forward trunk flexion; mild R LE circumduction with decreased weight acceptnace, knee control (but no overt buckling or LOB).  Additional distance limited by fatigue.  Stairs            Wheelchair Mobility    Modified Rankin (Stroke Patients Only)       Balance Overall balance assessment: Needs assistance Sitting-balance support: No upper extremity supported;Feet supported Sitting balance-Leahy Scale: Good     Standing balance support: Bilateral upper extremity supported Standing balance-Leahy Scale: Fair                               Pertinent Vitals/Pain Pain Assessment: 0-10 Pain Score: 6  Pain Location: back Pain Descriptors / Indicators: Aching;Grimacing;Guarding Pain Intervention(s): Limited activity within patient's tolerance;Monitored during session;Repositioned    Home Living Family/patient expects to be discharged to::  Private residence Living Arrangements: Spouse/significant other;Children Available Help at Discharge: Family Type  of Home: House Home Access: Stairs to enter Entrance Stairs-Rails: Left Entrance Stairs-Number of Steps: 4 Home Layout: Two level;Able to live on main level with bedroom/bathroom        Prior Function Level of Independence: Needs assistance   Gait / Transfers Assistance Needed: Ambulates with RW; no h/o falls although h/o R knee buckling   ADL's / Homemaking Assistance Needed: Independent with basic ADLs. Pt's wife assists with meal preparation and medication management.  Comments: Chart indicating limited mobility since recent discharge; wife feels unable to care for in home environment     Hand Dominance   Dominant Hand: Right    Extremity/Trunk Assessment   Upper Extremity Assessment Upper Extremity Assessment: Overall WFL for tasks assessed    Lower Extremity Assessment Lower Extremity Assessment: Generalized weakness(R LE grossly 3-/5, L LE grossly 4-/5)       Communication   Communication: No difficulties  Cognition Arousal/Alertness: Awake/alert Behavior During Therapy: WFL for tasks assessed/performed Overall Cognitive Status: Within Functional Limits for tasks assessed                                        General Comments      Exercises Other Exercises Other Exercises: Reviewed lumbar/back precautions, wearing schedule/recommendations, technique for donning brace; patient voiced understanding.  Question full comprehension and carry-over. Other Exercises: Sit/stand from elevated, but progressively lowered, seating surface, cga/min assist +2 for safety. Heavy use of momentum to complete; consistent cuing for hand placement.   Assessment/Plan    PT Assessment Patient needs continued PT services  PT Problem List Decreased strength;Decreased activity tolerance;Decreased balance;Decreased mobility;Decreased knowledge of precautions;Decreased cognition;Decreased knowledge of use of DME;Decreased safety awareness;Pain       PT Treatment  Interventions DME instruction;Gait training;Stair training;Functional mobility training;Therapeutic activities;Therapeutic exercise;Balance training;Patient/family education;Cognitive remediation    PT Goals (Current goals can be found in the Care Plan section)  Acute Rehab PT Goals Patient Stated Goal: to consider STR PT Goal Formulation: With patient Time For Goal Achievement: 11/14/17 Potential to Achieve Goals: Good    Frequency Min 2X/week   Barriers to discharge Decreased caregiver support      Co-evaluation               AM-PAC PT "6 Clicks" Daily Activity  Outcome Measure Difficulty turning over in bed (including adjusting bedclothes, sheets and blankets)?: A Little Difficulty moving from lying on back to sitting on the side of the bed? : A Little Difficulty sitting down on and standing up from a chair with arms (e.g., wheelchair, bedside commode, etc,.)?: Unable Help needed moving to and from a bed to chair (including a wheelchair)?: A Little Help needed walking in hospital room?: A Little Help needed climbing 3-5 steps with a railing? : A Lot 6 Click Score: 15    End of Session Equipment Utilized During Treatment: Gait belt;Back brace Activity Tolerance: Patient tolerated treatment well Patient left: in chair;with call bell/phone within reach;with chair alarm set Nurse Communication: Mobility status PT Visit Diagnosis: Other abnormalities of gait and mobility (R26.89);Muscle weakness (generalized) (M62.81);Difficulty in walking, not elsewhere classified (R26.2)    Time: 2025-4270 PT Time Calculation (min) (ACUTE ONLY): 23 min   Charges:   PT Evaluation $PT Eval Moderate Complexity: 1 Mod PT Treatments $Therapeutic Activity: 8-22 mins   PT G Codes:  Brenn Deziel H. Owens Shark, PT, DPT, NCS 10/31/17, 5:20 PM [phone]

## 2017-11-01 ENCOUNTER — Ambulatory Visit
Admit: 2017-11-01 | Discharge: 2017-11-01 | Disposition: A | Discharge status: Home / Self Care | Payer: Medicare Other | Attending: Radiation Oncology | Admitting: Radiation Oncology

## 2017-11-01 ENCOUNTER — Ambulatory Visit: Payer: Medicare Other

## 2017-11-01 ENCOUNTER — Observation Stay: Payer: Medicare Other

## 2017-11-01 DIAGNOSIS — C799 Secondary malignant neoplasm of unspecified site: Secondary | ICD-10-CM | POA: Diagnosis not present

## 2017-11-01 DIAGNOSIS — C439 Malignant melanoma of skin, unspecified: Secondary | ICD-10-CM | POA: Diagnosis not present

## 2017-11-01 DIAGNOSIS — Z515 Encounter for palliative care: Secondary | ICD-10-CM | POA: Diagnosis not present

## 2017-11-01 DIAGNOSIS — R531 Weakness: Secondary | ICD-10-CM | POA: Diagnosis not present

## 2017-11-01 DIAGNOSIS — G893 Neoplasm related pain (acute) (chronic): Secondary | ICD-10-CM | POA: Diagnosis not present

## 2017-11-01 LAB — BASIC METABOLIC PANEL
Anion gap: 5 (ref 5–15)
BUN: 83 mg/dL — ABNORMAL HIGH (ref 6–20)
CHLORIDE: 114 mmol/L — AB (ref 101–111)
CO2: 21 mmol/L — AB (ref 22–32)
CREATININE: 2.81 mg/dL — AB (ref 0.61–1.24)
Calcium: 8.9 mg/dL (ref 8.9–10.3)
GFR calc non Af Amer: 21 mL/min — ABNORMAL LOW (ref >60)
GFR, EST AFRICAN AMERICAN: 24 mL/min — AB (ref >60)
Glucose, Bld: 173 mg/dL — ABNORMAL HIGH (ref 65–99)
POTASSIUM: 5.7 mmol/L — AB (ref 3.5–5.1)
Sodium: 140 mmol/L (ref 135–145)

## 2017-11-01 LAB — GLUCOSE, CAPILLARY
GLUCOSE-CAPILLARY: 158 mg/dL — AB (ref 65–99)
GLUCOSE-CAPILLARY: 180 mg/dL — AB (ref 65–99)
GLUCOSE-CAPILLARY: 174 mg/dL — AB (ref 65–99)
Glucose-Capillary: 158 mg/dL — ABNORMAL HIGH (ref 65–99)
Glucose-Capillary: 148 mg/dL — ABNORMAL HIGH (ref 65–99)

## 2017-11-01 LAB — CBC
HEMATOCRIT: 27 % — AB (ref 40.0–52.0)
HEMOGLOBIN: 8.4 g/dL — AB (ref 13.0–18.0)
MCH: 26.5 pg (ref 26.0–34.0)
MCHC: 31.1 g/dL — AB (ref 32.0–36.0)
MCV: 85.2 fL (ref 80.0–100.0)
Platelets: 320 10*3/uL (ref 150–440)
RBC: 3.17 MIL/uL — AB (ref 4.40–5.90)
RDW: 14.5 % (ref 11.5–14.5)
WBC: 8.6 10*3/uL (ref 3.8–10.6)

## 2017-11-01 MED ORDER — PATIROMER SORBITEX CALCIUM 8.4 G PO PACK
8.4000 g | PACK | Freq: Every day | ORAL | Status: DC
  Administered 2017-11-01 – 2017-11-02 (×2): 8.4 g via ORAL
  Filled 2017-11-01 (×3): qty 4

## 2017-11-01 NOTE — Progress Notes (Addendum)
Per [facility] admissions coordinator at [facility] SNF authorization has been received. Patient is aware of above.  Per nurse patient will not D/C today. Beverlee Nims at [facility] is aware of above and start date for radiation and wheel chair Lucianne Lei transport is now Friday 11/03/17.     [employer], LCSW [phone]

## 2017-11-01 NOTE — Progress Notes (Signed)
Patient LOC has decreased, A&Ox2 with delayed responses. MD paged, CT ordered. Will continue to monitor. Kyla Balzarine, LPN

## 2017-11-01 NOTE — Progress Notes (Signed)
Inpatient Diabetes Program Recommendations  AACE/ADA: New Consensus Statement on Inpatient Glycemic Control (2015)  Target Ranges:  Prepandial:   less than 140 mg/dL      Peak postprandial:   less than 180 mg/dL (1-2 hours)      Critically ill patients:  140 - 180 mg/dL   Results for AEDEN, MATRANGA (MRN [id number]) as of 11/01/2017 07:38  Ref. Range 10/31/2017 08:32 10/31/2017 12:09 10/31/2017 16:37 10/31/2017 21:22 11/01/2017 07:30  Glucose-Capillary Latest Ref Range: 65 - 99 mg/dL 182 (H) 212 (H) 197 (H) 171 (H) 158 (H)   Review of Glycemic Control  Diabetes history: DM2 Outpatient Diabetes medications: Actos 45 mg daily, Glipizide 10 mg QPM Current orders for Inpatient glycemic control: Lantus 5 units daily, Novolog 0-9 units TID with meals  Inpatient Diabetes Program Recommendations: Insulin - Basal: Please consider increasing Lantus to 7 units daily. HgbA1C: A1C 8.3% on 10/24/2017 indicating an average glucose of 192 mg/dl over the past 2-3 months.  Note: Patient and his wife are agreeable to using basal insulin at home and would prefer to use insulin pens at home. If insulin prescribed at discharge, will need Rx for insulin pen(s) and insulin pen needles.  Thanks, Barnie Alderman, RN, MSN, CDE Diabetes Coordinator Inpatient Diabetes Program [phone] (Team Pager from 8am to 5pm)

## 2017-11-01 NOTE — Progress Notes (Signed)
Patient ID: Tyler Henderson, male   DOB: 01-10-1946, 73 y.o.   MRN: [id number]  This NP visited patient at the bedside as a follow up to  yesterday's [place] for palliative medicine needs and emotional support.  Wife and daughter at bedside.  Continued conversation regarding diagnosis and treatment plan.  Family again verbalizes concerns over viable transitions of care.  They have concerns regarding [facility] [facility] and reviews.    It is hard for them to understand why there are not more services for patients in their homes and better options for facilities for rehabilitation.  We discussed the importance of advocacy for their family member and presence as much as possible.   Emotional support offered, questions and concerns addressed.  Patient and family are open to all offered and available medical interventions to prolong life at this time .    I discussed with family the importance of continued conversation with  their  medical providers regarding overall plan of care and treatment options,  ensuring decisions are within the context of the patients values and GOCs specifically into the future and dependant on patient's response and outcomes to treatment.   I let family know I will be out of the hospital until Tuesday but they can contact the   Palliative medicine team via team phone number and we will continue to support holistically  Time in  0900        Time out  0935  Total time spent on the unit was 35 minutes  Greater than 50% of the time was spent in counseling and coordination of care  Wadie Lessen NP  Palliative Medicine Team Team Phone # [phone] Pager [phone]

## 2017-11-01 NOTE — Progress Notes (Signed)
[facility], [place] 11/01/17  Subjective:   Patient known to our practice from outpatient follow-up.  He is seen for chronic kidney disease stage 3, diabetic nephropathy, hypertension.  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  He is currently undergoing palliative chemotherapy and is followed by Dr Grayland Ormond. He is also noted to have acute urinary retention.  A Foley catheter was placed, which has resulted in improvement in serum creatinine to 2.4 , BUN improved to 83 Baseline creatinine is 2.10/GFR 30 This morning patient's creatinine slightly higher at 2.81.  Potassium is slightly high at 5.7. He was treated with a dose of Veltassa He is very lethargic today.  His wife reports it might be due to pain medications He did rest well at night    Objective:  Vital signs in last 24 hours:  Temp:  [98 F (36.7 C)-98.4 F (36.9 C)] 98.4 F (36.9 C) (04/10 1410) Pulse Rate:  [93-109] 100 (04/10 1410) Resp:  [16-20] 20 (04/10 1410) BP: (92-123)/(66-74) 123/67 (04/10 1410) SpO2:  [90 %-98 %] 90 % (04/10 1410)  Weight change:  Filed Weights   10/29/17 1109 10/29/17 1704 10/30/17 1543  Weight: (!) 327 lb (148.3 kg) (!) 341 lb (154.7 kg) (!) 333 lb 5.4 oz (151.2 kg)    Intake/Output:    Intake/Output Summary (Last 24 hours) at 11/01/2017 1637 Last data filed at 11/01/2017 1000 Gross per 24 hour  Intake 240 ml  Output 875 ml  Net -635 ml     Physical Exam: General:  Laying in bed resting quietly  HEENT  anicteric, moist oral mucous membranes  Neck  supple  Pulm/lungs  normal breathing effort, clear to auscultation  CVS/Heart  soft systolic murmur, regular  Abdomen:   Obese, nontender  Extremities:  Feet in bilateral soft supports, + edema  Neurologic:  lethargic but able to answer simple questions  Skin:  No acute rashes            Basic Metabolic Panel:  Recent Labs  Lab 10/26/17 0450 10/27/17 0918 10/29/17 1218  10/31/17 0500 11/01/17 0614  NA 135 130* 131* 137 140  K 4.8 4.9 4.9 4.9 5.7*  CL 107 102 105 111 114*  CO2 21* 18* 19* 19* 21*  GLUCOSE 149* 307* 241* 145* 173*  BUN 89* 93* 99* 83* 83*  CREATININE 3.40* 3.10* 3.37* 2.42* 2.81*  CALCIUM 8.3* 8.0* 8.5* 8.7* 8.9     CBC: Recent Labs  Lab 10/26/17 1056 10/27/17 0918 10/29/17 1218 11/01/17 0614  WBC  --  7.6 8.6 8.6  NEUTROABS  --  5.8  --   --   HGB 8.3* 7.8* 8.1* 8.4*  HCT  --  24.2* 25.5* 27.0*  MCV  --  83.7 83.0 85.2  PLT  --  247 252 320     No results found for: HEPBSAG, HEPBSAB, HEPBIGM    Microbiology:  Recent Results (from the past 240 hour(s))  Urine Culture     Status: None   Collection Time: 10/29/17 12:18 PM  Result Value Ref Range Status   Specimen Description   Final    URINE, RANDOM Performed at [facility], [address] [place], [place] [address]    Special Requests   Final    NONE Performed at [facility], [address] [place], [place] [address]    Culture   Final    NO GROWTH Performed at [facility], [address] [place], [place] [address]    Report Status 10/30/2017 FINAL  Final    Coagulation Studies: No results for input(s): LABPROT, INR in the last 72 hours.  Urinalysis: No results for input(s): COLORURINE, LABSPEC, PHURINE, GLUCOSEU, HGBUR, BILIRUBINUR, KETONESUR, PROTEINUR, UROBILINOGEN, NITRITE, LEUKOCYTESUR in the last 72 hours.  Invalid input(s): APPERANCEUR    Imaging: Ct Head Wo Contrast  Result Date: 11/01/2017 CLINICAL DATA:  Confusion/altered mental status. History of melanoma EXAM: CT HEAD WITHOUT CONTRAST TECHNIQUE: Contiguous axial images were obtained from the base of the skull through the vertex without intravenous contrast. COMPARISON:  Brain MRI October 11, 2017 FINDINGS: Brain: There is age related volume loss. There is no appreciable mass, hemorrhage, extra-axial fluid collection, or midline shift. The gray-white compartments  appear normal. No evident acute infarct. There are foci of calcification to the right of the fourth ventricle in the right anterior cerebellum. Vascular: No hyperdense vessels are evident. There is calcification in each carotid siphon. Skull: The bony calvarium appears intact. Sinuses/Orbits: There is mild mucosal thickening in several ethmoid air cells. Other visualized paranasal sinuses are clear. There is a concha bullosa on each side, an anatomic variant. Visualized orbits appear symmetric bilaterally. Other: Visualized mastoid air cells are clear. IMPRESSION: 1. Several benign-appearing calcifications are noted lateral to the fourth ventricle in the anterior cerebellum. No associated mass on noncontrast enhanced CT. MRI performed with contrast 3 weeks prior did not show a lesion in this area. These calcifications are felt to be benign although of uncertain etiology. 2.  No demonstrable mass or hemorrhage.  No acute infarct evident. 3.  Foci of arterial vascular calcification noted. 4.  Mucosal thickening in several ethmoid air cells. Electronically Signed   By: Lowella Grip III M.D.   On: 11/01/2017 15:19     Medications:    . acetaminophen  650 mg Oral Q12H  . feeding supplement (ENSURE ENLIVE)  237 mL Oral BID BM  . feeding supplement (GLUCERNA SHAKE)  237 mL Oral BID BM  . FLUoxetine  10 mg Oral Daily  . heparin  5,000 Units Subcutaneous Q8H  . insulin aspart  0-9 Units Subcutaneous TID WC  . insulin glargine  5 Units Subcutaneous Daily  . patiromer  8.4 g Oral Daily  . polyethylene glycol  17 g Oral Daily  . senna-docusate  1 tablet Oral BID   acetaminophen **OR** acetaminophen, ALPRAZolam, bisacodyl, lactulose, ondansetron **OR** ondansetron (ZOFRAN) IV, oxyCODONE, oxyCODONE, simethicone  Assessment/ Plan:  72 y.o. male With diabetes more than 15 years, GERD, osteoarthritis, hypertension, hyperlipidemia, GERD, obstructive sleep apnea, proteinuria, stage IV melanoma metastatic to  lungs, liver, bones undergoing palliative chemotherapy  1.  Acute renal failure on chronic kidney disease stage III Baseline creatinine is 2.10/GFR 30 from October 16, 2017 2.  Acute urinary retention Foley catheter placed this admission 3. Hyperkalemia Treated with Veltassa  Plan: Acute renal failure likely secondary to acute urinary retention. ? Related to pain meds Serum creatinine fluctuation noted Continue to monitor Voiding trial  Later today    LOS: 0 Tahra Hitzeman Candiss Norse 4/10/20194:37 PM  [place], [place]  Note: This note was prepared with Dragon dictation. Any transcription errors are unintentional

## 2017-11-01 NOTE — Progress Notes (Signed)
[place] [facility] [place] NAME: Tyler Henderson    MR#:  [id number]  DATE OF BIRTH:  09/01/1945  SUBJECTIVE: Patient denies any complaints.  Potassium is high today.  Needing maximum assistance with physical therapy.  Vitals stable without any fever.  Constipation improved.  Back pain controlled with pain medicine.  Spoke to patient's wife.  CHIEF COMPLAINT:   Chief Complaint  Patient presents with  . Weakness   -Radiation to be started yesterday for his metastatic spine disease.  Pain is better controlled Possible discharge to [facility] later today pending voiding trials,  REVIEW OF SYSTEMS:  Review of Systems  Constitutional: Positive for malaise/fatigue. Negative for chills and fever.  HENT: Negative for congestion, ear discharge, hearing loss and nosebleeds.   Eyes: Negative for blurred vision and double vision.  Respiratory: Negative for cough, shortness of breath and wheezing.   Cardiovascular: Positive for leg swelling. Negative for chest pain and palpitations.  Gastrointestinal: Negative for abdominal pain, constipation, diarrhea, nausea and vomiting.  Genitourinary: Negative for dysuria.  Musculoskeletal: Positive for back pain and myalgias.  Neurological: Positive for weakness. Negative for dizziness, speech change, focal weakness, seizures and headaches.  Psychiatric/Behavioral: Negative for depression.    DRUG ALLERGIES:  No Known Allergies  VITALS:  Blood pressure 92/66, pulse (!) 109, temperature 98.1 F (36.7 C), temperature source Oral, resp. rate 16, height 6\' 1"  (1.854 m), weight (!) 151.2 kg (333 lb 5.4 oz), SpO2 90 %.  PHYSICAL EXAMINATION:  Physical Exam  GENERAL:  72 y.o.-year-old obese patient lying in the bed with no acute distress.  EYES: Pupils equal, round, reactive to light and accommodation. No scleral icterus. Extraocular muscles intact.  HEENT: Head atraumatic, normocephalic. Oropharynx and  nasopharynx clear.  NECK:  Supple, no jugular venous distention. No thyroid enlargement, no tenderness.  LUNGS: Normal breath sounds bilaterally, no wheezing, rales,rhonchi or crepitation. No use of accessory muscles of respiration.  CARDIOVASCULAR: S1, S2 normal. No murmurs, rubs, or gallops.  ABDOMEN: Soft, distended but non tender. Bowel sounds present. No organomegaly or mass.  EXTREMITIES: No  cyanosis, or clubbing. 1-2+ pedal edema noted. NEUROLOGIC: Cranial nerves II through XII are intact. Muscle strength 5/5 in both upper extremities and 3/5 in lower extremities. Sensation intact. Gait not checked.  PSYCHIATRIC: The patient is alert and oriented x 3.  SKIN: No obvious rash, lesion, or ulcer.  Patient has TLSO brace on.  LABORATORY PANEL:   CBC Recent Labs  Lab 11/01/17 0614  WBC 8.6  HGB 8.4*  HCT 27.0*  PLT 320   ------------------------------------------------------------------------------------------------------------------  Chemistries  Recent Labs  Lab 10/29/17 1218  11/01/17 0614  NA 131*   < > 140  K 4.9   < > 5.7*  CL 105   < > 114*  CO2 19*   < > 21*  GLUCOSE 241*   < > 173*  BUN 99*   < > 83*  CREATININE 3.37*   < > 2.81*  CALCIUM 8.5*   < > 8.9  AST 22  --   --   ALT 23  --   --   ALKPHOS 188*  --   --   BILITOT 0.5  --   --    < > = values in this interval not displayed.   ------------------------------------------------------------------------------------------------------------------  Cardiac Enzymes No results for input(s): TROPONINI in the last 168 hours. ------------------------------------------------------------------------------------------------------------------  RADIOLOGY:  No results found.  EKG:   Orders placed  or performed during the hospital encounter of 10/29/17  . ED EKG  . ED EKG    ASSESSMENT AND PLAN:   72 year old male with recent diagnosis of stage IV metastatic melanoma with liver, lung and spine medicine  stasis,CK D stage IV, recent urinary retention with a Foley catheter, back pain and diabetes comes to hospital secondary to weakness.  1. Generalized weakness-medical therapy recommended SNF placement, likely discharge to [facility] when ready. Ellyn Hack to Education officer, museum note patient will be transferred to radiation therapy from [facility] every day.  2. Stage IV metastatic melanoma- metastases to liver, lung and bone - oncology f/u, appreciate Dr. Grayland Ormond seeing the patient and discussing with family. - radiation to back lesions- for pain control and also palliative immunotherapy started last week. -Patient is getting radiation therapy, due for another radiation therapy today. -Discussed with his oncologist as well  3. Acute urinary retention- s/p foley now, voiding trials today.  Patient supposed to see Dr. Erlene Quan as a part of follow-up from last discharge.  If patient fails voiding trials I will ask her to see the patient here. -   4. CKD stage 4-patient is seen by Dr. Murlean Iba yesterday, now has hyperkalemia, patient does have chronic hyperkalemia, started on Veltassa.  Renal function stable around 2.81.  Patient's creatinine as high as 4.79 Creatinine improved last week after foley placement  -Urine and creatinine still improving  5. 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  Patient has to put his back brace on before he can work with physical therapy -Pain is better controlled - also add laxatives prevent constipation while on pain medication  6. DM- lantus and SSI, seen by diabetic nurse, recommended to increase Lantus to 7 units daily, we will do that change.  7. DVT Prophylaxis- sq heparin  Palliative care consulted Continue current treatment.,  Voiding trials today, radiation therapy today monitor on telemetry because of hyperkalemia, Wife updated at bedside   All the records are reviewed and case  discussed with Care Management/Social Workerr. Management plans discussed with the patient, family and they are in agreement.  CODE STATUS: DNR per palliative care discussion  TOTAL TIME TAKING CARE OF THIS PATIENT: 45 minutes.   POSSIBLE D/C IN 1-2 DAYS, DEPENDING ON CLINICAL CONDITION.   Epifanio Lesches M.D on 11/01/2017 at 7:52 AM  Between 7am to 6pm - Pager - [phone]  After 6pm go to [email or web address] - password [email or web address] [facility]  Office  [phone]  CC: Primary care physician; Sofie Hartigan, MD

## 2017-11-02 ENCOUNTER — Encounter: Payer: Self-pay | Admitting: Oncology

## 2017-11-02 ENCOUNTER — Ambulatory Visit
Admit: 2017-11-02 | Discharge: 2017-11-02 | Disposition: A | Discharge status: Home / Self Care | Payer: Medicare Other | Attending: Radiation Oncology | Admitting: Radiation Oncology

## 2017-11-02 DIAGNOSIS — G2581 Restless legs syndrome: Secondary | ICD-10-CM | POA: Diagnosis not present

## 2017-11-02 DIAGNOSIS — Z8551 Personal history of malignant neoplasm of bladder: Secondary | ICD-10-CM | POA: Diagnosis not present

## 2017-11-02 DIAGNOSIS — E860 Dehydration: Secondary | ICD-10-CM | POA: Diagnosis present

## 2017-11-02 DIAGNOSIS — N179 Acute kidney failure, unspecified: Secondary | ICD-10-CM | POA: Diagnosis not present

## 2017-11-02 DIAGNOSIS — E875 Hyperkalemia: Secondary | ICD-10-CM | POA: Diagnosis not present

## 2017-11-02 DIAGNOSIS — I129 Hypertensive chronic kidney disease with stage 1 through stage 4 chronic kidney disease, or unspecified chronic kidney disease: Secondary | ICD-10-CM | POA: Diagnosis not present

## 2017-11-02 DIAGNOSIS — C787 Secondary malignant neoplasm of liver and intrahepatic bile duct: Secondary | ICD-10-CM | POA: Diagnosis not present

## 2017-11-02 DIAGNOSIS — R339 Retention of urine, unspecified: Secondary | ICD-10-CM | POA: Diagnosis not present

## 2017-11-02 DIAGNOSIS — Z96652 Presence of left artificial knee joint: Secondary | ICD-10-CM | POA: Diagnosis not present

## 2017-11-02 DIAGNOSIS — G893 Neoplasm related pain (acute) (chronic): Secondary | ICD-10-CM | POA: Diagnosis not present

## 2017-11-02 DIAGNOSIS — K219 Gastro-esophageal reflux disease without esophagitis: Secondary | ICD-10-CM | POA: Diagnosis not present

## 2017-11-02 DIAGNOSIS — C439 Malignant melanoma of skin, unspecified: Secondary | ICD-10-CM | POA: Diagnosis not present

## 2017-11-02 DIAGNOSIS — K5900 Constipation, unspecified: Secondary | ICD-10-CM | POA: Diagnosis not present

## 2017-11-02 DIAGNOSIS — E1122 Type 2 diabetes mellitus with diabetic chronic kidney disease: Secondary | ICD-10-CM | POA: Diagnosis not present

## 2017-11-02 DIAGNOSIS — E1121 Type 2 diabetes mellitus with diabetic nephropathy: Secondary | ICD-10-CM | POA: Diagnosis not present

## 2017-11-02 DIAGNOSIS — C7951 Secondary malignant neoplasm of bone: Secondary | ICD-10-CM | POA: Diagnosis not present

## 2017-11-02 DIAGNOSIS — G629 Polyneuropathy, unspecified: Secondary | ICD-10-CM | POA: Diagnosis not present

## 2017-11-02 DIAGNOSIS — F329 Major depressive disorder, single episode, unspecified: Secondary | ICD-10-CM | POA: Diagnosis not present

## 2017-11-02 DIAGNOSIS — C7800 Secondary malignant neoplasm of unspecified lung: Secondary | ICD-10-CM | POA: Diagnosis not present

## 2017-11-02 DIAGNOSIS — C799 Secondary malignant neoplasm of unspecified site: Secondary | ICD-10-CM | POA: Diagnosis not present

## 2017-11-02 DIAGNOSIS — F419 Anxiety disorder, unspecified: Secondary | ICD-10-CM | POA: Diagnosis not present

## 2017-11-02 DIAGNOSIS — G4733 Obstructive sleep apnea (adult) (pediatric): Secondary | ICD-10-CM | POA: Diagnosis not present

## 2017-11-02 DIAGNOSIS — Z66 Do not resuscitate: Secondary | ICD-10-CM | POA: Diagnosis not present

## 2017-11-02 DIAGNOSIS — N183 Chronic kidney disease, stage 3 (moderate): Secondary | ICD-10-CM | POA: Diagnosis not present

## 2017-11-02 DIAGNOSIS — Z515 Encounter for palliative care: Secondary | ICD-10-CM | POA: Diagnosis not present

## 2017-11-02 LAB — BASIC METABOLIC PANEL
Anion gap: 5 (ref 5–15)
BUN: 77 mg/dL — AB (ref 6–20)
CHLORIDE: 112 mmol/L — AB (ref 101–111)
CO2: 21 mmol/L — AB (ref 22–32)
CREATININE: 2.52 mg/dL — AB (ref 0.61–1.24)
Calcium: 8.8 mg/dL — ABNORMAL LOW (ref 8.9–10.3)
GFR calc Af Amer: 28 mL/min — ABNORMAL LOW (ref >60)
GFR calc non Af Amer: 24 mL/min — ABNORMAL LOW (ref >60)
GLUCOSE: 146 mg/dL — AB (ref 65–99)
Potassium: 5.4 mmol/L — ABNORMAL HIGH (ref 3.5–5.1)
Sodium: 138 mmol/L (ref 135–145)

## 2017-11-02 LAB — GLUCOSE, CAPILLARY
GLUCOSE-CAPILLARY: 175 mg/dL — AB (ref 65–99)
Glucose-Capillary: 136 mg/dL — ABNORMAL HIGH (ref 65–99)
Glucose-Capillary: 132 mg/dL — ABNORMAL HIGH (ref 65–99)

## 2017-11-02 MED ORDER — BISACODYL 5 MG PO TBEC
10.0000 mg | DELAYED_RELEASE_TABLET | Freq: Every day | ORAL | Status: DC
  Administered 2017-11-03: 10 mg via ORAL
  Filled 2017-11-02: qty 2

## 2017-11-02 MED ORDER — ALPRAZOLAM 0.25 MG PO TABS: 0.2500 mg | ORAL_TABLET | Freq: Every evening | ORAL | Status: DC | PRN

## 2017-11-02 MED ORDER — QUETIAPINE FUMARATE 25 MG PO TABS
25.0000 mg | ORAL_TABLET | Freq: Every day | ORAL | Status: DC
  Administered 2017-11-02: 25 mg via ORAL
  Filled 2017-11-02: qty 1

## 2017-11-02 MED ORDER — METOPROLOL TARTRATE 5 MG/5ML IV SOLN
5.0000 mg | INTRAVENOUS | Status: DC | PRN
  Administered 2017-11-02: 5 mg via INTRAVENOUS
  Filled 2017-11-02: qty 5

## 2017-11-02 MED ORDER — MEGESTROL ACETATE 400 MG/10ML PO SUSP
400.0000 mg | Freq: Every day | ORAL | Status: DC
  Administered 2017-11-02 – 2017-11-03 (×2): 400 mg via ORAL
  Filled 2017-11-02 (×2): qty 10

## 2017-11-02 MED ORDER — HALOPERIDOL 1 MG PO TABS
2.0000 mg | ORAL_TABLET | Freq: Every evening | ORAL | Status: DC | PRN
  Filled 2017-11-02: qty 1

## 2017-11-02 NOTE — Progress Notes (Signed)
PT Cancellation Note  Patient Details Name: Tyler Henderson CURRENT MRN: [id number] DOB: October 25, 1945   Cancelled Treatment:    Reason Eval/Treat Not Completed: Medical issues which prohibited therapy   Chart reviewed.  Potassium 5.7.  Due to therapy guidelines, will hold session at this time.  Will continue as appropriate.   Chesley Noon 11/02/2017, 9:50 AM

## 2017-11-02 NOTE — Progress Notes (Signed)
[place] at [place] NAME: Tyler Henderson    MR#:  [id number]  DATE OF BIRTH:  April 05, 1946  SUBJECTIVE: Patient was agitated last night so he received Xanax this morning he is confused but confusion cleared as the day progressed.  But still has very poor p.o. intake.  Sitting on recliner with physical therapy.  Daughter, wife at bedside.  Started on Megace to improve the appetite after discussing with family.  CHIEF COMPLAINT:   Chief Complaint  Patient presents with  . Weakness   -Patient is getting daily radiation therapy.  Failed voiding trial yesterday, Foley reinserted.  Spoke with Dr. Erlene Quan over the phone yesterday recommended to continue Foley due to urinary retention. REVIEW OF SYSTEMS:  Review of Systems  Constitutional: Negative for chills, fever and malaise/fatigue.  HENT: Negative for congestion, ear discharge, hearing loss and nosebleeds.   Eyes: Negative for blurred vision and double vision.  Respiratory: Negative for cough, shortness of breath and wheezing.   Cardiovascular: Negative for chest pain, palpitations and leg swelling.  Gastrointestinal: Negative for abdominal pain, constipation, diarrhea, nausea and vomiting.  Genitourinary: Negative for dysuria.  Musculoskeletal: Positive for back pain and myalgias.  Neurological: Positive for weakness. Negative for dizziness, speech change, focal weakness, seizures and headaches.  Psychiatric/Behavioral: Negative for depression.    DRUG ALLERGIES:  No Known Allergies  VITALS:  Blood pressure (!) 127/48, pulse (!) 115, temperature 97.6 F (36.4 C), temperature source Oral, resp. rate 17, height 6\' 1"  (1.854 m), weight (!) 151.2 kg (333 lb 5.4 oz), SpO2 96 %.  PHYSICAL EXAMINATION:  Physical Exam  GENERAL:  72 y.o.-year-old obese patient lying in the bed with no acute distress.  EYES: Pupils equal, round, reactive to light and accommodation. No scleral icterus. Extraocular  muscles intact.  HEENT: Head atraumatic, normocephalic. Oropharynx and nasopharynx clear.  NECK:  Supple, no jugular venous distention. No thyroid enlargement, no tenderness.  LUNGS: Normal breath sounds bilaterally, no wheezing, rales,rhonchi or crepitation. No use of accessory muscles of respiration.  CARDIOVASCULAR: S1, S2 normal. No murmurs, rubs, or gallops.  ABDOMEN: Soft, distended but non tender. Bowel sounds present. No organomegaly or mass.  EXTREMITIES: No  cyanosis, or clubbing. 1-2+ pedal edema noted. NEUROLOGIC: Cranial nerves II through XII are intact. Muscle strength 5/5 in both upper extremities and 3/5 in lower extremities. Sensation intact. Gait not checked.  PSYCHIATRIC: The patient is alert and oriented x 3.  SKIN: No obvious rash, lesion, or ulcer.  Patient has TLSO brace on.  LABORATORY PANEL:   CBC Recent Labs  Lab 11/01/17 0614  WBC 8.6  HGB 8.4*  HCT 27.0*  PLT 320   ------------------------------------------------------------------------------------------------------------------  Chemistries  Recent Labs  Lab 10/29/17 1218  11/02/17 0710  NA 131*   < > 138  K 4.9   < > 5.4*  CL 105   < > 112*  CO2 19*   < > 21*  GLUCOSE 241*   < > 146*  BUN 99*   < > 77*  CREATININE 3.37*   < > 2.52*  CALCIUM 8.5*   < > 8.8*  AST 22  --   --   ALT 23  --   --   ALKPHOS 188*  --   --   BILITOT 0.5  --   --    < > = values in this interval not displayed.   ------------------------------------------------------------------------------------------------------------------  Cardiac Enzymes No results for input(s): TROPONINI in the  last 168 hours. ------------------------------------------------------------------------------------------------------------------  RADIOLOGY:  Ct Head Wo Contrast  Result Date: 11/01/2017 CLINICAL DATA:  Confusion/altered mental status. History of melanoma EXAM: CT HEAD WITHOUT CONTRAST TECHNIQUE: Contiguous axial images were  obtained from the base of the skull through the vertex without intravenous contrast. COMPARISON:  Brain MRI October 11, 2017 FINDINGS: Brain: There is age related volume loss. There is no appreciable mass, hemorrhage, extra-axial fluid collection, or midline shift. The gray-white compartments appear normal. No evident acute infarct. There are foci of calcification to the right of the fourth ventricle in the right anterior cerebellum. Vascular: No hyperdense vessels are evident. There is calcification in each carotid siphon. Skull: The bony calvarium appears intact. Sinuses/Orbits: There is mild mucosal thickening in several ethmoid air cells. Other visualized paranasal sinuses are clear. There is a concha bullosa on each side, an anatomic variant. Visualized orbits appear symmetric bilaterally. Other: Visualized mastoid air cells are clear. IMPRESSION: 1. Several benign-appearing calcifications are noted lateral to the fourth ventricle in the anterior cerebellum. No associated mass on noncontrast enhanced CT. MRI performed with contrast 3 weeks prior did not show a lesion in this area. These calcifications are felt to be benign although of uncertain etiology. 2.  No demonstrable mass or hemorrhage.  No acute infarct evident. 3.  Foci of arterial vascular calcification noted. 4.  Mucosal thickening in several ethmoid air cells. Electronically Signed   By: Lowella Grip III M.D.   On: 11/01/2017 15:19    EKG:   Orders placed or performed during the hospital encounter of 10/29/17  . ED EKG  . ED EKG    ASSESSMENT AND PLAN:   72 year old male with recent diagnosis of stage IV metastatic melanoma with liver, lung and spine medicine stasis,CK D stage IV, recent urinary retention with a Foley catheter, back pain and diabetes comes to hospital secondary to weakness.  1. Generalized weakness-medical therapy recommended SNF placement, likely discharge to [facility] tomorrow.   patient will be  transferred to radiation therapy from [facility] every day.  2. Stage IV metastatic melanoma- metastases to liver, lung and bone - oncology f/u, appreciate Dr. Grayland Ormond seeing the patient and discussing with family. - radiation to back lesions- for pain control and also palliative immunotherapy started last week. -Patient is getting radiation therapy, due for another radiation therapy today. -Discussed with his oncologist as well  3. Acute urinary retention-  failed voiding trials.  Voiding trials today.  continue Foley at discharge, to try voiding trials again follow-up with Dr. Erlene Quan as an outpatient in 1 week. 4.  Acute on CKD stage 4-patient is seen by Dr. Murlean Iba, started on [facility] for hyperkalemia.  Started on Veltassa for hyperkalemia;on valtessa.potassium better.  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  Patient's creatinine as high as 4.79 Creatinine improved last week after foley placement  -Urine and creatinine still improving  5. 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  Patient has to put his back brace on before he can work with physical therapy -Pain is better controlled - also add laxatives prevent constipation while on pain medication  6. DM- lantus and SSI, seen by diabetic nurse,on lantus,SSI  7. DVT Prophylaxis- sq heparin  Palliative care consulted [place];megace started Confusion likely due to xanax and  Narcotic;xanax dose decreased after discussing with family Likely d./ctomorrow to [place] after Radiation therapy. All the records are reviewed and case discussed with Care Management/Social Workerr. Management plans discussed  with the patient, family and they are in agreement.  CODE STATUS: DNR per palliative care discussion  TOTAL TIME TAKING CARE OF THIS PATIENT:35  POSSIBLE D/C IN 1-2 DAYS, DEPENDING ON CLINICAL CONDITION.  D/w wife and  daughter Epifanio Lesches M.D on 11/02/2017 at 7:15 PM  Between 7am to 6pm - Pager - [phone]  After 6pm go to [email or web address] - password [email or web address] [facility]  Office  [phone]  CC: Primary care physician; Sofie Hartigan, MD

## 2017-11-02 NOTE — Progress Notes (Signed)
Per MD patient will likely D/C tomorrow pending medical clearance. Clinical Social Worker (CSW) spoke to Beverlee Nims at [facility] who confirmed that patient had radiation Tuesday, Wednesday and today. Patient will have radiation tomorrow prior to D/C and start outpatient radiation on Monday. Per Beverlee Nims she has the cancer center wheel chair van scheduled to pick up patient Monday 11/06/17 at 1:45 pm at [facility]. [facility] admissions coordinator at [facility] is aware of above.   [employer], LCSW [phone]

## 2017-11-02 NOTE — Progress Notes (Signed)
Checked on patient and wife.    Patient reports a difficult night.  Wife states he was agitated.  She believes he had too many medications.  In reviewing his medications I do not see anything that may have caused agitation.  Patient mentions constipation.  He can not remember his last bowel movement.  He takes ducolax at home when he has constipation.  He is on senna and miralax.  Finally he mentions back pain.  The brace he is wearing is helpful to him.  He would like to try a heating pad as well.  I asked the patient and his wife if they had any questions I could answer or if they wanted to discuss next steps.  Wife stated the plan was to discharge to rehab tomorrow.   Will order ducolax and heating pad as well as a PRN for low dose haldol QHS.    Palliative will follow at a distance, but be available if needed.  Florentina Jenny, PA-C Palliative Medicine Pager: [phone]   Total time 25 min.

## 2017-11-03 ENCOUNTER — Ambulatory Visit
Admit: 2017-11-03 | Discharge: 2017-11-03 | Disposition: A | Discharge status: Home / Self Care | Payer: Medicare Other | Attending: Radiation Oncology | Admitting: Radiation Oncology

## 2017-11-03 DIAGNOSIS — N179 Acute kidney failure, unspecified: Secondary | ICD-10-CM | POA: Diagnosis not present

## 2017-11-03 DIAGNOSIS — E860 Dehydration: Secondary | ICD-10-CM | POA: Diagnosis not present

## 2017-11-03 LAB — GLUCOSE, CAPILLARY
GLUCOSE-CAPILLARY: 167 mg/dL — AB (ref 65–99)
GLUCOSE-CAPILLARY: 131 mg/dL — AB (ref 65–99)
Glucose-Capillary: 127 mg/dL — ABNORMAL HIGH (ref 65–99)

## 2017-11-03 MED ORDER — GLUCERNA SHAKE PO LIQD: 237.0000 mL | Freq: Two times a day (BID) | ORAL | 0 refills | Status: AC

## 2017-11-03 MED ORDER — SIMETHICONE 80 MG PO CHEW: 80.0000 mg | CHEWABLE_TABLET | Freq: Four times a day (QID) | ORAL | 0 refills | Status: AC | PRN

## 2017-11-03 MED ORDER — INSULIN LISPRO 100 UNIT/ML ~~LOC~~ SOLN: 5.0000 [IU] | Freq: Three times a day (TID) | SUBCUTANEOUS | 1 refills | Status: AC

## 2017-11-03 MED ORDER — LACTULOSE 10 GM/15ML PO SOLN: 20.0000 g | Freq: Two times a day (BID) | ORAL | 0 refills | Status: AC | PRN

## 2017-11-03 MED ORDER — PATIROMER SORBITEX CALCIUM 8.4 G PO PACK: 8.4000 g | PACK | Freq: Every day | ORAL | 0 refills | Status: AC

## 2017-11-03 MED ORDER — ALPRAZOLAM 0.25 MG PO TABS: 0.2500 mg | ORAL_TABLET | Freq: Every evening | ORAL | 0 refills | Status: DC | PRN

## 2017-11-03 MED ORDER — INSULIN GLARGINE 100 UNIT/ML ~~LOC~~ SOLN: 5.0000 [IU] | Freq: Every day | SUBCUTANEOUS | 11 refills | Status: AC

## 2017-11-03 MED ORDER — OXYCODONE HCL 5 MG PO TABS: 5.0000 mg | ORAL_TABLET | ORAL | 0 refills | Status: AC | PRN

## 2017-11-03 NOTE — Progress Notes (Signed)
[place], [place] 11/03/17  Subjective:  Foley catheter remains in place. Output was 800 cc over the preceding 24 hours. No new creatinine today.  Objective:  Vital signs in last 24 hours:  Temp:  [97.6 F (36.4 C)-99.2 F (37.3 C)] 98.8 F (37.1 C) (04/12 1200) Pulse Rate:  [113-124] 117 (04/12 1200) Resp:  [20-22] 20 (04/12 1200) BP: (104-137)/(61-73) 137/72 (04/12 1200) SpO2:  [94 %-95 %] 95 % (04/12 1200)  Weight change:  Filed Weights   10/29/17 1109 10/29/17 1704 10/30/17 1543  Weight: (!) 148.3 kg (327 lb) (!) 154.7 kg (341 lb) (!) 151.2 kg (333 lb 5.4 oz)    Intake/Output:    Intake/Output Summary (Last 24 hours) at 11/03/2017 1306 Last data filed at 11/03/2017 1035 Gross per 24 hour  Intake 0 ml  Output 1450 ml  Net -1450 ml     Physical Exam: General:  Laying in bed resting quietly  HEENT  anicteric, moist oral mucous membranes  Neck  supple  Pulm/lungs  normal breathing effort, clear to auscultation  CVS/Heart  soft systolic murmur, regular  Abdomen:   Obese, nontender  Extremities:  Feet in bilateral soft supports, + edema  Neurologic:  awake, alert, following commands  Skin:  No acute rashes            Basic Metabolic Panel:  Recent Labs  Lab 10/29/17 1218 10/31/17 0500 11/01/17 0614 11/02/17 0710  NA 131* 137 140 138  K 4.9 4.9 5.7* 5.4*  CL 105 111 114* 112*  CO2 19* 19* 21* 21*  GLUCOSE 241* 145* 173* 146*  BUN 99* 83* 83* 77*  CREATININE 3.37* 2.42* 2.81* 2.52*  CALCIUM 8.5* 8.7* 8.9 8.8*     CBC: Recent Labs  Lab 10/29/17 1218 11/01/17 0614  WBC 8.6 8.6  HGB 8.1* 8.4*  HCT 25.5* 27.0*  MCV 83.0 85.2  PLT 252 320     No results found for: HEPBSAG, HEPBSAB, HEPBIGM    Microbiology:  Recent Results (from the past 240 hour(s))  Urine Culture     Status: None   Collection Time: 10/29/17 12:18 PM  Result Value Ref Range Status   Specimen Description   Final    URINE, RANDOM Performed  at [facility], [address] [place], [place] [address]    Special Requests   Final    NONE Performed at [facility], [address] [place], [place] [address]    Culture   Final    NO GROWTH Performed at [facility], [place] [address] [place], [place] [address]    Report Status 10/30/2017 FINAL  Final    Coagulation Studies: No results for input(s): LABPROT, INR in the last 72 hours.  Urinalysis: No results for input(s): COLORURINE, LABSPEC, PHURINE, GLUCOSEU, HGBUR, BILIRUBINUR, KETONESUR, PROTEINUR, UROBILINOGEN, NITRITE, LEUKOCYTESUR in the last 72 hours.  Invalid input(s): APPERANCEUR    Imaging: Ct Head Wo Contrast  Result Date: 11/01/2017 CLINICAL DATA:  Confusion/altered mental status. History of melanoma EXAM: CT HEAD WITHOUT CONTRAST TECHNIQUE: Contiguous axial images were obtained from the base of the skull through the vertex without intravenous contrast. COMPARISON:  Brain MRI October 11, 2017 FINDINGS: Brain: There is age related volume loss. There is no appreciable mass, hemorrhage, extra-axial fluid collection, or midline shift. The gray-white compartments appear normal. No evident acute infarct. There are foci of calcification to the right of the fourth ventricle in the right anterior cerebellum. Vascular: No hyperdense vessels are  evident. There is calcification in each carotid siphon. Skull: The bony calvarium appears intact. Sinuses/Orbits: There is mild mucosal thickening in several ethmoid air cells. Other visualized paranasal sinuses are clear. There is a concha bullosa on each side, an anatomic variant. Visualized orbits appear symmetric bilaterally. Other: Visualized mastoid air cells are clear. IMPRESSION: 1. Several benign-appearing calcifications are noted lateral to the fourth ventricle in the anterior cerebellum. No associated mass on noncontrast enhanced CT. MRI performed with contrast 3 weeks prior did not show a lesion in this  area. These calcifications are felt to be benign although of uncertain etiology. 2.  No demonstrable mass or hemorrhage.  No acute infarct evident. 3.  Foci of arterial vascular calcification noted. 4.  Mucosal thickening in several ethmoid air cells. Electronically Signed   By: Lowella Grip III M.D.   On: 11/01/2017 15:19     Medications:    . acetaminophen  650 mg Oral Q12H  . bisacodyl  10 mg Oral Daily  . feeding supplement (ENSURE ENLIVE)  237 mL Oral BID BM  . feeding supplement (GLUCERNA SHAKE)  237 mL Oral BID BM  . FLUoxetine  10 mg Oral Daily  . heparin  5,000 Units Subcutaneous Q8H  . insulin aspart  0-9 Units Subcutaneous TID WC  . insulin glargine  5 Units Subcutaneous Daily  . megestrol  400 mg Oral Daily  . patiromer  8.4 g Oral Daily  . polyethylene glycol  17 g Oral Daily  . senna-docusate  1 tablet Oral BID   acetaminophen **OR** acetaminophen, ALPRAZolam, haloperidol, lactulose, metoprolol tartrate, ondansetron **OR** ondansetron (ZOFRAN) IV, oxyCODONE, oxyCODONE, simethicone  Assessment/ Plan:  72 y.o. 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  Acute renal failure on chronic kidney disease stage III Baseline creatinine is 2.10/GFR 30 from October 16, 2017 2.  Acute urinary retention Foley catheter placed this admission 3. Hyperkalemia Treated with Veltassa  Plan: Creatinine still means above baseline at 2.5.  Foley catheter in place and appears to be draining well.  Recommend continued monitoring of renal function periodically.  Serum potassium slightly down to 5.4.  Maintain the patient on veltassa 8.4 g p.o. daily.  We may need to increase this a bit if potassium remains high.    LOS: 1 Tytionna Cloyd 4/12/20191:06 PM  [place],  [place]  Note: This note was prepared with Dragon dictation. Any transcription errors are unintentional

## 2017-11-03 NOTE — Progress Notes (Signed)
Patient is medically stable for D/C to [facility] today and will start outpatient radiation on Monday 11/06/17. [place] wheel chair Lucianne Lei will pick patient up from [facility] for radiation. Per [facility] admissions coordinator at [facility] patient can come today. RN will call report and arrange EMS for transport. Clinical Education officer, museum (CSW) sent D/C orders to [facility] via [place]. [facility] authorization has been received. Patient is aware of above. Patient's wife Blanch Media was at bedside and aware of above. Please reconsult if future social work needs arise. CSW signing off.   [employer], LCSW [phone]

## 2017-11-03 NOTE — Clinical Social Work Placement (Signed)
   CLINICAL SOCIAL WORK PLACEMENT  NOTE  Date:  11/03/2017  Patient Details  Name: Tyler Henderson MRN: [id number] Date of Birth: 1946-03-14  Clinical Social Work is seeking post-discharge placement for this patient at the [place] level of care (*CSW will initial, date and re-position this form in  chart as items are completed):  Yes   Patient/family provided with [place] Work Department's list of facilities offering this level of care within the geographic area requested by the patient (or if unable, by the patient's family).  Yes   Patient/family informed of their freedom to choose among providers that offer the needed level of care, that participate in Medicare, Medicaid or managed care program needed by the patient, have an available bed and are willing to accept the patient.  Yes   Patient/family informed of [facility]'s ownership interest in [facility] and [facility] as well as of the fact that they are under no obligation to receive care at these facilities.  PASRR submitted to EDS on 10/30/17     PASRR number received on 10/30/17     Existing PASRR number confirmed on       FL2 transmitted to all facilities in geographic area requested by pt/family on 10/30/17     FL2 transmitted to all facilities within larger geographic area on       Patient informed that his/her managed care company has contracts with or will negotiate with certain facilities, including the following:        Yes   Patient/family informed of bed offers received.  Patient chooses bed at [facility] )     Physician recommends and patient chooses bed at      Patient to be transferred to [facility] ) on 11/03/17.  Patient to be transferred to facility by [facility] )     Patient family notified on 11/03/17 of transfer.  Name of family member notified:  (Patient's wife Blanch Media is at bedside and aware of D/C today. )      PHYSICIAN       Additional Comment:    _______________________________________________ Nimra Puccinelli, Veronia Beets, LCSW 11/03/2017, 2:58 PM

## 2017-11-03 NOTE — Progress Notes (Signed)
Report called to [phone]. Assigned RN unavailable. Report provided to [employer]. No questions at end of call. Unit phone number [phone] given with report for RN to contact with any follow up questions. Non emergent transport requested following close of report.

## 2017-11-03 NOTE — Discharge Summary (Signed)
Tyler Henderson, is a 72 y.o. male  DOB 1946-04-16  MRN [id number].  Admission date:  10/29/2017  Admitting Physician  Fritzi Mandes, MD  Discharge Date:  11/03/2017   Primary MD  Sofie Hartigan, MD  Recommendations for primary care physician for things to follow:   Patient is to go to radiation therapy every day cancer center.  Follow with  PCP in 1 week Patient being discharged to [facility]. Admission Diagnosis  Dehydration [E86.0] Generalized weakness [R53.1]   Discharge Diagnosis  Dehydration [E86.0] Generalized weakness [R53.1]    Active Problems:   Metastatic melanoma ([place])   Generalized weakness   Palliative care by specialist   DNR (do not resuscitate)   Cancer associated pain   Constipation      Past Medical History:  Diagnosis Date  . Anxiety   . Arthritis   . Bladder cancer ([place])   . Chronic kidney disease   . Depression   . Diabetes ([place])   . Dysrhythmia    atrial fib  . GERD (gastroesophageal reflux disease)   . Hypertension   . Restless leg syndrome   . Shortness of breath dyspnea    with exertion  . Skin cancer   . Sleep apnea    CPAP "once in awhile"  . 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 APPENDECTOMY    . JOINT REPLACEMENT Left    Total Knee Replacement  . KNEE ARTHROSCOPY Left   . PORTA CATH INSERTION N/A 10/19/2017   Procedure: PORTA CATH INSERTION;  Surgeon: Algernon Huxley, MD;  Location: [facility];  Service: Cardiovascular;  Laterality: N/A;  . REPLACEMENT TOTAL KNEE Left 2012   Dr. Little Ishikawa, Jr. [facility]  . TRANSURETHRAL RESECTION OF BLADDER TUMOR N/A 03/14/2016   Procedure: TRANSURETHRAL RESECTION OF BLADDER TUMOR (TURBT);  Surgeon: Hollice Espy, MD;  Location: [facility];  Service: Urology;  Laterality:  N/A;       History of present illness and  Hospital Course:     Kindly see H&P for history of present illness and admission details, please review complete Labs, Consult reports and Test reports for all details in brief  HPI  from the history and physical done on the day of admission 72 year old male patient with diabetes mellitus type 2, GERD, osteoarthritis, hypertension, hyperlipidemia, GERD, obstructive sleep apnea, proteinuria, stage III chronic  kidney disease, 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 Patient was just discharged on April 4 with similar symptoms and discharged home with home health.  Family brought patient back because patient has poor appetite, not able to eat or drink unable to do ADLs.  Patient admitted for progressive weakness, not able to ambulate   Hospital Course   Generalized weakness, patient extremely weak unable to get out of the bed without any support, physical therapy saw the patient, patient is going to rehab [facility]. 2.  Stage IV metastatic melanoma with metastases to liver, lung, bone, patient is seen by Dr. Grayland Ormond.  Patient is getting radiation to the back for pain control, patient is also on palliative immunotherapy started recently as per Dr. Grayland Ormond. 3.  Urine retention, patient failed voiding trials, has a Foley, I spoke with Dr. Erlene Quan over the phone.  Patient supposed to have follow-up with Dr. Erlene Quan on April 9 for voiding trials from previous discharge but  patient failed voiding trials again with urine retention up to 400 mL of urine.  And patient will be going to [facility] with Foley with voiding trials in a week and see Dr. Erlene Quan in 1 week. 5.  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  Patient is getting  narcotic pain medicine for pain control. 6.  Constipation improved with the stool softeners, patient now has diarrhea secondary to laxative so patient is to give stool softeners as needed and start scheduled. 7.  Poor p.o. intake secondary to metastatic melanoma, Megace started to improve the appetite.  Patient continues to have poor p.o. intake. 8.  Acute renal failure on chronic kidney disease stage III, seen by nephrology.  Patient known to them from outpatient practice, potassium as high as 5.7 so he received Veltassa.  Acute renal failure on chronic kidney disease stage III with urine retention secondary to above medical problems and also due to narcotic pain medicines.  Patient gotten as high as 4.79 but improved to 2.51. T continue Veltassa for hyperkalemia that is recurrent problem even before this admission.   #9 diabetes mellitus type 2: Patient is on very low-dose Lantus, SSI with coverage. 10.  Anxiety, chronic pain due to back pain from metastases.  Patient is at very low dose of Xanax as per discussion with family. 11.  Poor p.o. intake is main problem.    Discharge Condition: stable   Follow UP  Contact information for after-discharge care    [facility] .   Service:  Skilled Nursing Contact information: [place] [phone]                Discharge Instructions  and  Discharge Medications    Discharge Instructions    Discharge instructions   Complete by:  As directed    Insulin SQ Correction coverage: Sensitive (thin, NPO, renal)  CBG < 70: implement hypoglycemia protocol  CBG 70 - 120: 0 units  CBG 121 - 150: 1 unit  CBG 151 - 200: 2 units  CBG 201 - 250: 3 units  CBG 251 - 300: 5 units  CBG 301 - 350: 7 units  CBG 351 - 400 9 units  CBG > 400 call MD and obtain STAT lab verification     Allergies as of 11/03/2017   No Known Allergies     Medication List    STOP taking  these medications   acetaminophen 650 MG CR tablet Commonly known as:  TYLENOL   docusate sodium 100 MG capsule Commonly known as:  COLACE   gabapentin 300 MG capsule Commonly known as:  NEURONTIN   glipiZIDE 10 MG tablet Commonly known as:  GLUCOTROL   lisinopril 40 MG tablet Commonly known as:  PRINIVIL,ZESTRIL   megestrol 40 MG tablet Commonly known as:  MEGACE   pioglitazone 45 MG tablet Commonly known as:  ACTOS     TAKE these medications   ALPRAZolam 0.25 MG tablet Commonly known as:  XANAX Take 1 tablet (0.25 mg total) by mouth at bedtime as needed for anxiety or sleep. What changed:    medication strength  how much to take  reasons to take this   aspirin EC 81 MG tablet Take 81 mg by mouth every evening.   atorvastatin 40 MG tablet Commonly known as:  LIPITOR Take 40  mg by mouth in the evening   carvedilol 25 MG tablet Commonly known as:  COREG Take 25 mg by mouth twice daily   feeding supplement (GLUCERNA SHAKE) Liqd Take 237 mLs by mouth 2 (two) times daily between meals. What changed:  when to take this   FISH OIL PO Take 1 capsule by mouth 2 (two) times daily.   FLUoxetine 10 MG capsule Commonly known as:  PROZAC Take 10 mg by mouth daily.   insulin glargine 100 UNIT/ML injection Commonly known as:  LANTUS Inject 0.05 mLs (5 Units total) into the skin daily.   insulin lispro 100 UNIT/ML injection Commonly known as:  HUMALOG Inject 0.05 mLs (5 Units total) into the skin 3 (three) times daily with meals.   lactulose 10 GM/15ML solution Commonly known as:  CHRONULAC Take 30 mLs (20 g total) by mouth 2 (two) times daily as needed for moderate constipation.   lidocaine-prilocaine cream Commonly known as:  EMLA Apply to affected area once   MULTI-VITAMINS Tabs Take 1 tablet by mouth daily. Alive Mens Multivitamin   omeprazole 20 MG capsule Commonly known as:  PRILOSEC Take 20 mg by mouth in the evening   oxyCODONE 5 MG immediate  release tablet Commonly known as:  Oxy IR/ROXICODONE Take 1 tablet (5 mg total) by mouth every 4 (four) hours as needed for moderate pain. What changed:    medication strength  how much to take  when to take this  reasons to take this   patiromer 8.4 g packet Commonly known as:  VELTASSA Take 1 packet (8.4 g total) by mouth daily. What changed:  when to take this   simethicone 80 MG chewable tablet Commonly known as:  MYLICON Chew 1 tablet (80 mg total) by mouth 4 (four) times daily as needed for flatulence.   SLOW RELEASE IRON 160 (50 Fe) MG Tbcr SR tablet Generic drug:  ferrous sulfate Take 160 mg by mouth 2 (two) times daily.   tamsulosin 0.4 MG Caps capsule Commonly known as:  FLOMAX Take 1 capsule (0.4 mg total) by mouth daily after supper.         Diet and Activity recommendation: See Discharge Instructions above   Consults obtained -oncology, nephrology, physical therapy, palliative care   Major procedures and Radiology Reports - PLEASE review detailed and final reports for all details, in brief -     Dg Lumbar Spine Complete W/bend  Result Date: 10/24/2017 CLINICAL DATA:  72 y/o  M; metastatic melanoma to lumbar spine. EXAM: LUMBAR SPINE - COMPLETE WITH BENDING VIEWS COMPARISON:  10/24/2017 lumbar spine MRI. FINDINGS: Five lumbar type non-rib-bearing vertebral bodies. Straightening of lumbar lordosis without listhesis. No dynamic instability on flexion and extension views. Mild loss of L3-4 and L5-S1 intervertebral disc space height. Multilevel small anterior marginal osteophytes. Metastatic lesions within the L4 vertebral body and paraspinal muscles prior lumbar MRI poorly visualized. IMPRESSION: 1.  No acute fracture or dislocation identified. 2. Lumbar spondylosis with loss of disc space height at L3-4 and L5-S1. 3. No dynamic instability. Electronically Signed   By: Kristine Garbe M.D.   On: 10/24/2017 16:10   Dg Abdomen 1 View  Result Date:  10/23/2017 CLINICAL DATA:  Bladder carcinoma.  Pain and inability to urinate EXAM: ABDOMEN - 1 VIEW COMPARISON:  PET-CT September 21, 2017 FINDINGS: There is moderate stool throughout the colon. There is no bowel dilatation or air-fluid level to suggest bowel obstruction. No free air. There are probable phleboliths in the pelvis.  Urinary bladder does not appear distended by radiography. IMPRESSION: No bowel obstruction or free air. Urinary bladder does not appear distended by radiography. Electronically Signed   By: Lowella Grip III M.D.   On: 10/23/2017 10:51   Ct Head Wo Contrast  Result Date: 11/01/2017 CLINICAL DATA:  Confusion/altered mental status. History of melanoma EXAM: CT HEAD WITHOUT CONTRAST TECHNIQUE: Contiguous axial images were obtained from the base of the skull through the vertex without intravenous contrast. COMPARISON:  Brain MRI October 11, 2017 FINDINGS: Brain: There is age related volume loss. There is no appreciable mass, hemorrhage, extra-axial fluid collection, or midline shift. The gray-white compartments appear normal. No evident acute infarct. There are foci of calcification to the right of the fourth ventricle in the right anterior cerebellum. Vascular: No hyperdense vessels are evident. There is calcification in each carotid siphon. Skull: The bony calvarium appears intact. Sinuses/Orbits: There is mild mucosal thickening in several ethmoid air cells. Other visualized paranasal sinuses are clear. There is a concha bullosa on each side, an anatomic variant. Visualized orbits appear symmetric bilaterally. Other: Visualized mastoid air cells are clear. IMPRESSION: 1. Several benign-appearing calcifications are noted lateral to the fourth ventricle in the anterior cerebellum. No associated mass on noncontrast enhanced CT. MRI performed with contrast 3 weeks prior did not show a lesion in this area. These calcifications are felt to be benign although of uncertain etiology. 2.  No  demonstrable mass or hemorrhage.  No acute infarct evident. 3.  Foci of arterial vascular calcification noted. 4.  Mucosal thickening in several ethmoid air cells. Electronically Signed   By: Lowella Grip III M.D.   On: 11/01/2017 15:19   Mr Jeri Cos LF Contrast  Result Date: 10/11/2017 CLINICAL DATA:  Bladder cancer with metastatic disease to the liver, skeleton, and lungs. EXAM: MRI HEAD WITHOUT AND WITH CONTRAST TECHNIQUE: Multiplanar, multiecho pulse sequences of the brain and surrounding structures were obtained without and with intravenous contrast. CONTRAST:  27mL MULTIHANCE GADOBENATE DIMEGLUMINE 529 MG/ML IV SOLN COMPARISON:  PET scan 09/21/2017. FINDINGS: Brain: No focal enhancing lesions are present to suggest metastatic disease the brain or meninges. Minimal white matter changes are within normal limits for age. No acute infarct, hemorrhage, or mass lesion is present. Ventricles are of normal size. The internal auditory canals are within normal limits bilaterally. The brainstem and cerebellum are normal. No significant extra-axial fluid collection is present. Vascular: Flow is present in the major intracranial arteries. Globes and orbits are at that Skull and upper cervical spine: The calvarium is intact. Focal lesions or enhancement are evident. The skull base is within normal limits. The craniocervical junction is normal. Sinuses/Orbits: Mild mucosal thickening is present along the floor of the maxillary sinuses bilaterally. No fluid levels are present. The paranasal sinuses and mastoid air cells are otherwise clear. Globes and orbits are within normal limits. IMPRESSION: 1. No evidence for metastatic disease to the brain or meninges. 2. Normal MRI appearance of the brain for age. 3. Minimal maxillary sinus disease. Electronically Signed   By: San Morelle M.D.   On: 10/11/2017 11:54   Mr Lumbar Spine Wo Contrast  Result Date: 10/24/2017 CLINICAL DATA:  Difficulty urinating for 3  days. History of metastatic melanoma, bladder cancer and chronic kidney disease. EXAM: MRI LUMBAR SPINE WITHOUT CONTRAST TECHNIQUE: Multiplanar, multisequence MR imaging of the lumbar spine was performed. No intravenous contrast was administered. COMPARISON:  PET CT September 21, 2017 FINDINGS: SEGMENTATION: For the purposes of this report, the last  well-formed intervertebral disc is reported as L5-S1. ALIGNMENT: Maintained lumbar lordosis. No malalignment. VERTEBRAE:30 mm low T1, bright STIR signal mass RIGHT L4 vertebral body extending to the pedicle with mild acute fracture, less than 15% height loss superior endplate associated with Schmorl's node. Low T1, bright STIR signal RIGHT T12 facet consistent with metastasis. Ovoid subcentimeter low T1, bright STIR signal inferior to LEFT L2 pedicle, likely extra osseous cyst. Remaining vertebral bodies intact. Congenital canal narrowing on the basis of foreshortened pedicles. Moderate to severe L5-S1 disc height loss with mild chronic discogenic endplate changes. Mild disc desiccation L2-3 through L5-S1. CONUS MEDULLARIS AND CAUDA EQUINA: Conus medullaris terminates at L1-2 and demonstrates normal morphology and signal characteristics. Cauda equina is normal. PARASPINAL AND OTHER SOFT TISSUES: T2 bright cyst LEFT kidney. Mild bright interstitial STIR signal bilateral paraspinal muscles seen with low-grade strain. 9 mm round cystic lesion RIGHT paraspinal soft tissues fluid fluid level. At least 4.8 cm mass contiguous with RIGHT sacroiliac joint undermining the ileo psoas muscles. DISC LEVELS: L1-2, L2-3: No disc bulge, canal stenosis nor neural foraminal narrowing. Mild facet arthropathy. L3-4: RIGHT L4 osseous metastasis with a 9 x 7 mm tumoral extension to RIGHT lateral recess displacing the traversing RIGHT L4 nerve. Small broad-based disc bulge and mild facet arthropathy. Mild canal stenosis. Mild bilateral neural foraminal narrowing. L4-5: Small broad-based disc  bulge, annular fissure. Mild facet arthropathy and ligamentum flavum redundancy. Moderate canal stenosis. Moderate RIGHT, mild LEFT neural foraminal narrowing. L5-S1: Moderate broad-based disc bulge. Mild facet arthropathy and ligamentum flavum redundancy. No canal stenosis. Mild to moderate bilateral neural foraminal narrowing. IMPRESSION: 1. RIGHT L4 metastasis with mild pathologic fracture, tumor expansion into epidural space resulting in traversing RIGHT L4 nerve impingement. RIGHT T12 facet metastasis. 2. 2 subcentimeter cystic paraspinal muscle masses concerning for metastatic deposits. At least 4.8 cm RIGHT pelvic metastasis. 3. 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 Moderate canal stenosis L4-5, mild canal stenosis L3-4. 4. Neural foraminal narrowing L3-4 through L5-S1: Moderate on the RIGHT at L4-5. Electronically Signed   By: Elon Alas M.D.   On: 10/24/2017 01:31   US Renal  Result Date: 10/23/2017 CLINICAL DATA:  Acute renal injury, history of metastatic bladder carcinoma EXAM: RENAL / URINARY TRACT ULTRASOUND COMPLETE COMPARISON:  07/14/2017. FINDINGS: Right Kidney: Length: 11.7 cm. Echogenicity within normal limits. No mass or hydronephrosis visualized. Left Kidney: Length: 12.1 cm. Cysts are noted within the left kidney. The largest of these measures 9.1 cm in greatest dimension. These are stable when compared with prior CT examination. No hydronephrosis is noted. Bladder: Appears normal for degree of bladder distention. Note is made of known lesions within the right lobe of the liver. IMPRESSION: Left renal cystic change. Stable masses in the liver are noted. Electronically Signed   By: Inez Catalina M.D.   On: 10/23/2017 15:24   US Biopsy (liver)  Result Date: 10/06/2017 INDICATION: Concern for metastatic lung cancer, now with multiple hypermetabolic liver lesions. Please perform ultrasound-guided liver lesion biopsy for tissue diagnostic  purposes. EXAM: ULTRASOUND GUIDED LIVER LESION BIOPSY COMPARISON:  PET-CT - 09/21/2017 MEDICATIONS: None ANESTHESIA/SEDATION: Fentanyl 50 mcg IV; Versed 2 mg IV Total Moderate Sedation time:  14 Minutes. The patient's level of consciousness and vital signs were monitored continuously by radiology nursing throughout the procedure under my direct supervision. COMPLICATIONS: None immediate. PROCEDURE: Informed written consent was obtained from the patient after a discussion of the risks, benefits and alternatives to treatment. The patient understands and consents  the procedure. A timeout was performed prior to the initiation of the procedure. Ultrasound scanning was performed of the right upper abdominal quadrant demonstrates multiple mixed echogenic lesions and masses within both the right and left lobes of the liver. An approximately 3.2 x 2.5 cm hypoechoic lesion/mass within the medial segment of left lobe liver correlating with the hypoattenuating lesion seen on preceding PET-CT image 153, series 4, was targeted for biopsy given lesion location and sonographic window. The procedure was planned. The right upper abdominal quadrant was prepped and draped in the usual sterile fashion. The overlying soft tissues were anesthetized with 1% lidocaine with epinephrine. A 17 gauge, 6.8 cm co-axial needle was advanced into a peripheral aspect of the lesion. This was followed by 5 core biopsies with an 18 gauge core device under direct ultrasound guidance. The coaxial needle tract was embolized with a small amount of Gel-Foam slurry and superficial hemostasis was obtained with manual compression. Post procedural scanning was negative for definitive area of hemorrhage or additional complication. A dressing was placed. The patient tolerated the procedure well without immediate post procedural complication. IMPRESSION: Technically successful ultrasound guided core needle biopsy of indeterminate lesion/mass within the medial  segment of the left lobe of the liver. Electronically Signed   By: [name] Mariscal M.D.   On: 10/06/2017 12:02   Dg Chest Port 1 View  Result Date: 10/29/2017 CLINICAL DATA:  72 year old male with increased weakness and disorientation EXAM: PORTABLE CHEST 1 VIEW COMPARISON:  Prior chest x-ray 08/26/2010; prior PET-CT 09/21/2017 FINDINGS: Right IJ approach single-lumen port catheter. The catheter tip overlies the right innominate vein. Cardiomegaly. Mild pulmonary vascular congestion without overt edema. Large left upper lobe pulmonary nodule as seen on prior PET-CT. No acute osseous abnormality. No pleural effusion or pneumothorax. IMPRESSION: 1. Cardiomegaly and pulmonary vascular congestion without overt edema. 2. Right IJ approach single-lumen port catheter with the tip overlying the innominate vein. 3. Left upper lobe pulmonary mass as seen on prior PET-CT. Electronically Signed   By: Jacqulynn Cadet M.D.   On: 10/29/2017 11:44    Micro Results     Recent Results (from the past 240 hour(s))  Urine Culture     Status: None   Collection Time: 10/29/17 12:18 PM  Result Value Ref Range Status   Specimen Description   Final    URINE, RANDOM Performed at [facility], [address] [place], [place] [address]    Special Requests   Final    NONE Performed at [facility], [address], [place], [place] [address]    Culture   Final    NO GROWTH Performed at [facility], [place] [address], [place], [place] [address]    Report Status 10/30/2017 FINAL  Final       Today   Subjective:   Luretha Rued stable for discharge to [facility] care.  Objective:   Blood pressure 137/72, pulse (!) 117, temperature 98.8 F (37.1 C), temperature source Oral, resp. rate 20, height 6\' 1"  (1.854 m), weight (!) 151.2 kg (333 lb 5.4 oz), SpO2 95 %.   Intake/Output Summary (Last 24 hours) at 11/03/2017 1254 Last data filed at 11/03/2017 1035 Gross per 24 hour  Intake 0  ml  Output 1450 ml  Net -1450 ml    Exam Awake Alert, Oriented x 3, No new F.N deficits, Normal affect [place].AT,PERRAL Supple Neck,No JVD, No cervical lymphadenopathy appriciated.  Symmetrical Chest wall movement, Good air movement bilaterally, CTAB RRR,No Gallops,Rubs  or new Murmurs, No Parasternal Heave +ve B.Sounds, Abd Soft, Non tender, No organomegaly appriciated, No rebound -guarding or rigidity. No Cyanosis, Clubbing or edema, No new Rash or bruise  Data Review   CBC w Diff:  Lab Results  Component Value Date   WBC 8.6 11/01/2017   HGB 8.4 (L) 11/01/2017   HCT 27.0 (L) 11/01/2017   PLT 320 11/01/2017   LYMPHOPCT 8 10/27/2017   MONOPCT 15 10/27/2017   EOSPCT 0 10/27/2017   BASOPCT 0 10/27/2017    CMP:  Lab Results  Component Value Date   NA 138 11/02/2017   K 5.4 (H) 11/02/2017   CL 112 (H) 11/02/2017   CO2 21 (L) 11/02/2017   BUN 77 (H) 11/02/2017   CREATININE 2.52 (H) 11/02/2017   PROT 5.8 (L) 10/29/2017   ALBUMIN 2.1 (L) 10/29/2017   BILITOT 0.5 10/29/2017   ALKPHOS 188 (H) 10/29/2017   AST 22 10/29/2017   ALT 23 10/29/2017  .   Total Time in preparing paper work, data evaluation and todays exam - 35 minutes  Epifanio Lesches M.D on 11/03/2017 at 12:54 PM    Note: This dictation was prepared with Dragon dictation along with smaller phrase technology. Any transcriptional errors that result from this process are unintentional.

## 2017-11-05 NOTE — Progress Notes (Signed)
Tyler Henderson  [phone] [phone]  ID: Rolene Course OB: 07/18/1946  MR#: [id number]  HBZ#:[id number]  Patient Care Team: Sofie Hartigan, MD as PCP - General (Family Medicine)  CHIEF COMPLAINT: Stage IV metastatic melanoma with liver, lung, and bony metastasis.  INTERVAL HISTORY: Patient returns to clinic today for further evaluation and consideration of cycle 2 of combination immunotherapy.  He was recently discharged from the hospital and now is in a temporary rehab facility.  He continues to have a decreased performance status and chronic weakness and fatigue.  He has a poor appetite. He has no neurologic complaints.  He denies any recent fevers. He has no chest pain or shortness of breath.  He denies any nausea, vomiting, constipation, or diarrhea. He does not complain of melena or hematochezia today.  He now has a Foley catheter for urinary retention.  Patient offers no further specific complaints today.  REVIEW OF SYSTEMS:   Review of Systems  Constitutional: Positive for malaise/fatigue. Negative for fever and weight loss.  Respiratory: Negative.  Negative for cough, hemoptysis and shortness of breath.   Cardiovascular: Negative.  Negative for chest pain and leg swelling.  Gastrointestinal: Negative.  Negative for abdominal pain, blood in stool and melena.  Genitourinary: Negative.  Negative for dysuria.  Musculoskeletal: Positive for back pain and joint pain.  Skin: Negative.  Negative for rash.  Neurological: Positive for weakness. Negative for dizziness, sensory change and focal weakness.  Psychiatric/Behavioral: The patient has insomnia. The patient is not nervous/anxious.     As per HPI. Otherwise, a complete review of systems is negative.  PAST MEDICAL HISTORY: Past Medical History:  Diagnosis Date  . Anxiety   . Arthritis   . Bladder cancer ([place])   . Chronic kidney disease   . Depression   . Diabetes ([place])   .  Dysrhythmia    atrial fib  . GERD (gastroesophageal reflux disease)   . Hypertension   . Restless leg syndrome   . Shortness of breath dyspnea    with exertion  . Skin cancer   . Sleep apnea    CPAP "once in awhile"  . 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 APPENDECTOMY    . JOINT REPLACEMENT Left    Total Knee Replacement  . KNEE ARTHROSCOPY Left   . PORTA CATH INSERTION N/A 10/19/2017   Procedure: PORTA CATH INSERTION;  Surgeon: Algernon Huxley, MD;  Location: [facility];  Service: Cardiovascular;  Laterality: N/A;  . REPLACEMENT TOTAL KNEE Left 2012   Dr. Little Ishikawa, Jr. [facility]  . TRANSURETHRAL RESECTION OF BLADDER TUMOR N/A 03/14/2016   Procedure: TRANSURETHRAL RESECTION OF BLADDER TUMOR (TURBT);  Surgeon: Hollice Espy, MD;  Location: [facility];  Service: Urology;  Laterality: N/A;    FAMILY HISTORY: Family History  Problem Relation Age of Onset  . Cancer Mother        Breast  . Heart failure Father   . Prostate cancer Neg Hx   . Bladder Cancer Neg Hx     ADVANCED DIRECTIVES (Y/N):  N  HEALTH MAINTENANCE: Social History   Tobacco Use  . Smoking status: Former Smoker    Packs/day: 1.00    Years: 20.00    Pack years: 20.00    Types: Cigarettes    Last attempt to quit: 1979    Years since quitting: 40.3  . Smokeless tobacco: Never Used  Substance Use Topics  . Alcohol use: Yes    Alcohol/week: 1.2 oz    Types: 2 Cans of beer per week    Comment: occassional  . Drug use: No     Colonoscopy:  PAP:  Bone density:  Lipid panel:  No Known Allergies  Current Outpatient Medications  Medication Sig Dispense Refill  . ALPRAZolam (XANAX) 0.25 MG tablet Take 1 tablet (0.25 mg total) by mouth 3 (three) times daily as needed for anxiety or sleep. 60 tablet 0  . aspirin EC 81 MG tablet Take 81 mg by mouth every evening.     Marland Kitchen atorvastatin (LIPITOR) 40 MG tablet Take 40 mg by mouth in  the evening    . carvedilol (COREG) 25 MG tablet Take 25 mg by mouth twice daily    . feeding supplement, GLUCERNA SHAKE, (GLUCERNA SHAKE) LIQD Take 237 mLs by mouth 2 (two) times daily between meals. 30 Can 0  . ferrous sulfate (SLOW RELEASE IRON) 160 (50 Fe) MG TBCR SR tablet Take 160 mg by mouth 2 (two) times daily.     . insulin glargine (LANTUS) 100 UNIT/ML injection Inject 0.05 mLs (5 Units total) into the skin daily. 10 mL 11  . insulin lispro (HUMALOG) 100 UNIT/ML injection Inject 0.05 mLs (5 Units total) into the skin 3 (three) times daily with meals. 10 mL 1  . Multiple Vitamin (MULTI-VITAMINS) TABS Take 1 tablet by mouth daily. Alive Mens Multivitamin    . Omega-3 Fatty Acids (FISH OIL PO) Take 1 capsule by mouth 2 (two) times daily.     Marland Kitchen omeprazole (PRILOSEC) 20 MG capsule Take 20 mg by mouth in the evening    . oxyCODONE (OXY IR/ROXICODONE) 5 MG immediate release tablet Take 1 tablet (5 mg total) by mouth every 4 (four) hours as needed for moderate pain. 30 tablet 0  . tamsulosin (FLOMAX) 0.4 MG CAPS capsule Take 1 capsule (0.4 mg total) by mouth daily after supper. 30 capsule 0  . FLUoxetine (PROZAC) 10 MG capsule Take 10 mg by mouth daily.     Marland Kitchen lactulose (CHRONULAC) 10 GM/15ML solution Take 30 mLs (20 g total) by mouth 2 (two) times daily as needed for moderate constipation. (Patient not taking: Reported on 11/09/2017) 240 mL 0  . lidocaine-prilocaine (EMLA) cream Apply to affected area once (Patient not taking: Reported on 11/09/2017) 30 g 3  . megestrol (MEGACE) 40 MG tablet Take 1 tablet (40 mg total) by mouth daily. 30 tablet 2  . patiromer (VELTASSA) 8.4 g packet Take 1 packet (8.4 g total) by mouth daily. (Patient not taking: Reported on 11/09/2017) 30 packet 0  . simethicone (MYLICON) 80 MG chewable tablet Chew 1 tablet (80 mg total) by mouth 4 (four) times daily as needed for flatulence. (Patient not taking: Reported on 11/09/2017) 30 tablet 0   No current facility-administered  medications for this visit.     OBJECTIVE: Vitals:   11/09/17 0931  BP: 94/61  Pulse: 94  Resp: 18  Temp: 98.2 F (36.8 C)  SpO2: 96%     Body mass index is 43.98 kg/m.    ECOG FS:2 - Symptomatic, <50% confined to bed  General: Ill-appearing, no acute distress.  Sitting in a wheelchair. Eyes: Pink conjunctiva, anicteric sclera. HEENT: Normocephalic, moist mucous membranes, clear oropharnyx. Lungs: Clear to auscultation bilaterally. Heart: Regular rate and rhythm. No rubs, murmurs, or gallops. Abdomen: Soft, nontender, nondistended. No organomegaly noted, normoactive bowel sounds. Musculoskeletal: No edema, cyanosis, or clubbing.  Foley catheter noted. Neuro: Alert, answering all questions appropriately. Cranial nerves grossly intact. Skin: No rashes or petechiae noted. Psych: Normal affect.   LAB RESULTS:  Lab Results  Component Value Date   NA 137 11/09/2017   K 4.9 11/09/2017   CL 112 (H) 11/09/2017   CO2 18 (L) 11/09/2017   GLUCOSE 167 (H) 11/09/2017   BUN 50 (H) 11/09/2017   CREATININE 1.96 (H) 11/09/2017   CALCIUM 8.2 (L) 11/09/2017   PROT 5.7 (L) 11/09/2017   ALBUMIN 2.1 (L) 11/09/2017   AST 25 11/09/2017   ALT 29 11/09/2017   ALKPHOS 280 (H) 11/09/2017   BILITOT 0.4 11/09/2017   GFRNONAA 32 (L) 11/09/2017   GFRAA 37 (L) 11/09/2017    Lab Results  Component Value Date   WBC 5.8 11/09/2017   NEUTROABS 4.1 11/09/2017   HGB 8.2 (L) 11/09/2017   HCT 25.3 (L) 11/09/2017   MCV 83.8 11/09/2017   PLT 214 11/09/2017     STUDIES: Dg Lumbar Spine Complete W/bend  Result Date: 10/24/2017 CLINICAL DATA:  72 y/o  M; metastatic melanoma to lumbar spine. EXAM: LUMBAR SPINE - COMPLETE WITH BENDING VIEWS COMPARISON:  10/24/2017 lumbar spine MRI. FINDINGS: Five lumbar type non-rib-bearing vertebral bodies. Straightening of lumbar lordosis without listhesis. No dynamic instability on flexion and extension views. Mild loss of L3-4 and L5-S1 intervertebral disc space  height. Multilevel small anterior marginal osteophytes. Metastatic lesions within the L4 vertebral body and paraspinal muscles prior lumbar MRI poorly visualized. IMPRESSION: 1.  No acute fracture or dislocation identified. 2. Lumbar spondylosis with loss of disc space height at L3-4 and L5-S1. 3. No dynamic instability. Electronically Signed   By: Kristine Garbe M.D.   On: 10/24/2017 16:10   Dg Abdomen 1 View  Result Date: 10/23/2017 CLINICAL DATA:  Bladder carcinoma.  Pain and inability to urinate EXAM: ABDOMEN - 1 VIEW COMPARISON:  PET-CT September 21, 2017 FINDINGS: There is moderate stool throughout the colon. There is no bowel dilatation or air-fluid level to suggest bowel obstruction. No free air. There are probable phleboliths in the pelvis. Urinary bladder does not appear distended by radiography. IMPRESSION: No bowel obstruction or free air. Urinary bladder does not appear distended by radiography. Electronically Signed   By: Lowella Grip III M.D.   On: 10/23/2017 10:51   Ct Head Wo Contrast  Result Date: 11/01/2017 CLINICAL DATA:  Confusion/altered mental status. History of melanoma EXAM: CT HEAD WITHOUT CONTRAST TECHNIQUE: Contiguous axial images were obtained from the base of the skull through the vertex without intravenous contrast. COMPARISON:  Brain MRI October 11, 2017 FINDINGS: Brain: There is age related volume loss. There is no appreciable mass, hemorrhage, extra-axial fluid collection, or midline shift. The gray-white compartments appear normal. No evident acute infarct. There are foci of calcification to the right of the fourth ventricle in the right anterior cerebellum. Vascular: No hyperdense vessels are evident. There is calcification in each carotid siphon. Skull: The bony calvarium appears intact. Sinuses/Orbits: There is mild mucosal thickening in several ethmoid air cells. Other visualized paranasal sinuses are clear. There is a concha bullosa on each side, an anatomic  variant. Visualized orbits appear symmetric bilaterally. Other: Visualized mastoid air cells are clear. IMPRESSION: 1. Several benign-appearing calcifications are noted lateral to the fourth ventricle in the anterior cerebellum. No associated mass on noncontrast enhanced CT. MRI performed with contrast 3 weeks prior did not show a lesion in this area. These calcifications are felt to be benign although of uncertain  etiology. 2.  No demonstrable mass or hemorrhage.  No acute infarct evident. 3.  Foci of arterial vascular calcification noted. 4.  Mucosal thickening in several ethmoid air cells. Electronically Signed   By: Lowella Grip III M.D.   On: 11/01/2017 15:19   Mr Lumbar Spine Wo Contrast  Result Date: 10/24/2017 CLINICAL DATA:  Difficulty urinating for 3 days. History of metastatic melanoma, bladder cancer and chronic kidney disease. EXAM: MRI LUMBAR SPINE WITHOUT CONTRAST TECHNIQUE: Multiplanar, multisequence MR imaging of the lumbar spine was performed. No intravenous contrast was administered. COMPARISON:  PET CT September 21, 2017 FINDINGS: SEGMENTATION: For the purposes of this report, the last well-formed intervertebral disc is reported as L5-S1. ALIGNMENT: Maintained lumbar lordosis. No malalignment. VERTEBRAE:30 mm low T1, bright STIR signal mass RIGHT L4 vertebral body extending to the pedicle with mild acute fracture, less than 15% height loss superior endplate associated with Schmorl's node. Low T1, bright STIR signal RIGHT T12 facet consistent with metastasis. Ovoid subcentimeter low T1, bright STIR signal inferior to LEFT L2 pedicle, likely extra osseous cyst. Remaining vertebral bodies intact. Congenital canal narrowing on the basis of foreshortened pedicles. Moderate to severe L5-S1 disc height loss with mild chronic discogenic endplate changes. Mild disc desiccation L2-3 through L5-S1. CONUS MEDULLARIS AND CAUDA EQUINA: Conus medullaris terminates at L1-2 and demonstrates normal  morphology and signal characteristics. Cauda equina is normal. PARASPINAL AND OTHER SOFT TISSUES: T2 bright cyst LEFT kidney. Mild bright interstitial STIR signal bilateral paraspinal muscles seen with low-grade strain. 9 mm round cystic lesion RIGHT paraspinal soft tissues fluid fluid level. At least 4.8 cm mass contiguous with RIGHT sacroiliac joint undermining the ileo psoas muscles. DISC LEVELS: L1-2, L2-3: No disc bulge, canal stenosis nor neural foraminal narrowing. Mild facet arthropathy. L3-4: RIGHT L4 osseous metastasis with a 9 x 7 mm tumoral extension to RIGHT lateral recess displacing the traversing RIGHT L4 nerve. Small broad-based disc bulge and mild facet arthropathy. Mild canal stenosis. Mild bilateral neural foraminal narrowing. L4-5: Small broad-based disc bulge, annular fissure. Mild facet arthropathy and ligamentum flavum redundancy. Moderate canal stenosis. Moderate RIGHT, mild LEFT neural foraminal narrowing. L5-S1: Moderate broad-based disc bulge. Mild facet arthropathy and ligamentum flavum redundancy. No canal stenosis. Mild to moderate bilateral neural foraminal narrowing. IMPRESSION: 1. RIGHT L4 metastasis with mild pathologic fracture, tumor expansion into epidural space resulting in traversing RIGHT L4 nerve impingement. RIGHT T12 facet metastasis. 2. 2 subcentimeter cystic paraspinal muscle masses concerning for metastatic deposits. At least 4.8 cm RIGHT pelvic metastasis. 3. 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 Moderate canal stenosis L4-5, mild canal stenosis L3-4. 4. Neural foraminal narrowing L3-4 through L5-S1: Moderate on the RIGHT at L4-5. Electronically Signed   By: Elon Alas M.D.   On: 10/24/2017 01:31   US Renal  Result Date: 10/23/2017 CLINICAL DATA:  Acute renal injury, history of metastatic bladder carcinoma EXAM: RENAL / URINARY TRACT ULTRASOUND COMPLETE COMPARISON:  07/14/2017. FINDINGS: Right Kidney: Length: 11.7 cm.  Echogenicity within normal limits. No mass or hydronephrosis visualized. Left Kidney: Length: 12.1 cm. Cysts are noted within the left kidney. The largest of these measures 9.1 cm in greatest dimension. These are stable when compared with prior CT examination. No hydronephrosis is noted. Bladder: Appears normal for degree of bladder distention. Note is made of known lesions within the right lobe of the liver. IMPRESSION: Left renal cystic change. Stable masses in the liver are noted. Electronically Signed   By: Linus Mako.D.  On: 10/23/2017 15:24   Dg Chest Port 1 View  Result Date: 10/29/2017 CLINICAL DATA:  72 year old male with increased weakness and disorientation EXAM: PORTABLE CHEST 1 VIEW COMPARISON:  Prior chest x-ray 08/26/2010; prior PET-CT 09/21/2017 FINDINGS: Right IJ approach single-lumen port catheter. The catheter tip overlies the right innominate vein. Cardiomegaly. Mild pulmonary vascular congestion without overt edema. Large left upper lobe pulmonary nodule as seen on prior PET-CT. No acute osseous abnormality. No pleural effusion or pneumothorax. IMPRESSION: 1. Cardiomegaly and pulmonary vascular congestion without overt edema. 2. Right IJ approach single-lumen port catheter with the tip overlying the innominate vein. 3. Left upper lobe pulmonary mass as seen on prior PET-CT. Electronically Signed   By: Jacqulynn Cadet M.D.   On: 10/29/2017 11:44    ASSESSMENT: Stage IV metastatic melanoma with liver, lung, and bony metastasis.  BRAF negative.  PLAN:    1. Stage IV metastatic melanoma with liver, lung, and bony metastasis: PET scan results from September 13, 2017 reviewed independently with widespread metastatic disease.  MRI of the brain negative for disease.  Liver biopsy consistent with metastatic melanoma. BRAF mutation is negative.  Continue with combination immunotherapy using nivolumab and ipilimumab.  Plan giving 4 cycles every 3 weeks of combination therapy and then  transition to single agent maintenance nivolumab every 2 weeks until progression of disease.  Proceed with cycle 2 of 4 of nivolumab and ipilimumab today.  Return to clinic in 3 weeks for further evaluation and consideration of cycle 3.   2.  Acute renal failure: Improving.  Patient's creatinine is 1.96 today.  He continues to have a Foley catheter in place.  Continue follow-up with urology and nephrology as scheduled.   2.  Anxiety: Continue Xanax of 0.5 mg twice per day as needed. 3.  Pain: Continue daily XRT completing on November 13, 2017.  Continue oxycodone 10 mg tabs as needed.  4.  Hyperglycemia: Improved control.  Continue current medications as prescribed. 5.  Hypotension: Patient blood pressure is mildly decreased today.  Continue to monitor closely. 6.  Anemia: Hemoglobin is decreased, monitor.  Consider blood transfusion if it continues to trend down.   Patient expressed understanding and was in agreement with this plan. He also understands that He can call clinic at any time with any questions, concerns, or complaints.   Cancer Staging Metastatic melanoma [facility]) Staging form: Melanoma of the Skin, AJCC 8th Edition - Clinical stage from 10/15/2017: Stage IV (cTX, cNX, pM1c(1)) - Signed by Lloyd Huger, MD on 10/15/2017   Lloyd Huger, MD   11/14/2017 11:18 AM

## 2017-11-06 ENCOUNTER — Ambulatory Visit
Admission: RE | Admit: 2017-11-06 | Discharge: 2017-11-06 | Disposition: A | Discharge status: Home / Self Care | Payer: Medicare Other | Source: Ambulatory Visit | Attending: Radiation Oncology | Admitting: Radiation Oncology

## 2017-11-06 DIAGNOSIS — C7951 Secondary malignant neoplasm of bone: Secondary | ICD-10-CM | POA: Diagnosis not present

## 2017-11-06 DIAGNOSIS — C439 Malignant melanoma of skin, unspecified: Secondary | ICD-10-CM | POA: Diagnosis present

## 2017-11-06 DIAGNOSIS — Z510 Encounter for antineoplastic radiation therapy: Secondary | ICD-10-CM | POA: Diagnosis not present

## 2017-11-06 DIAGNOSIS — C7800 Secondary malignant neoplasm of unspecified lung: Secondary | ICD-10-CM | POA: Diagnosis not present

## 2017-11-06 DIAGNOSIS — C787 Secondary malignant neoplasm of liver and intrahepatic bile duct: Secondary | ICD-10-CM | POA: Diagnosis not present

## 2017-11-07 ENCOUNTER — Ambulatory Visit
Admission: RE | Admit: 2017-11-07 | Discharge: 2017-11-07 | Disposition: A | Discharge status: Home / Self Care | Payer: Medicare Other | Source: Ambulatory Visit | Attending: Radiation Oncology | Admitting: Radiation Oncology

## 2017-11-07 DIAGNOSIS — C439 Malignant melanoma of skin, unspecified: Secondary | ICD-10-CM | POA: Diagnosis not present

## 2017-11-08 ENCOUNTER — Ambulatory Visit
Admission: RE | Admit: 2017-11-08 | Discharge: 2017-11-08 | Disposition: A | Discharge status: Home / Self Care | Payer: Medicare Other | Source: Ambulatory Visit | Attending: Radiation Oncology | Admitting: Radiation Oncology

## 2017-11-08 DIAGNOSIS — C439 Malignant melanoma of skin, unspecified: Secondary | ICD-10-CM | POA: Diagnosis not present

## 2017-11-09 ENCOUNTER — Ambulatory Visit
Admission: RE | Admit: 2017-11-09 | Discharge: 2017-11-09 | Disposition: A | Discharge status: Home / Self Care | Payer: Medicare Other | Source: Ambulatory Visit | Attending: Radiation Oncology | Admitting: Radiation Oncology

## 2017-11-09 ENCOUNTER — Encounter: Payer: Self-pay | Admitting: Oncology

## 2017-11-09 ENCOUNTER — Inpatient Hospital Stay (HOSPITAL_BASED_OUTPATIENT_CLINIC_OR_DEPARTMENT_OTHER): Payer: Medicare Other | Admitting: Oncology

## 2017-11-09 ENCOUNTER — Inpatient Hospital Stay: Payer: Medicare Other

## 2017-11-09 ENCOUNTER — Other Ambulatory Visit: Payer: Self-pay | Admitting: Oncology

## 2017-11-09 VITALS — BP 94/61 | HR 94 | Temp 98.2°F | Resp 18 | Ht 73.0 in

## 2017-11-09 DIAGNOSIS — C787 Secondary malignant neoplasm of liver and intrahepatic bile duct: Secondary | ICD-10-CM

## 2017-11-09 DIAGNOSIS — R339 Retention of urine, unspecified: Secondary | ICD-10-CM | POA: Diagnosis not present

## 2017-11-09 DIAGNOSIS — F419 Anxiety disorder, unspecified: Secondary | ICD-10-CM

## 2017-11-09 DIAGNOSIS — D649 Anemia, unspecified: Secondary | ICD-10-CM

## 2017-11-09 DIAGNOSIS — R739 Hyperglycemia, unspecified: Secondary | ICD-10-CM | POA: Diagnosis not present

## 2017-11-09 DIAGNOSIS — C799 Secondary malignant neoplasm of unspecified site: Secondary | ICD-10-CM

## 2017-11-09 DIAGNOSIS — R52 Pain, unspecified: Secondary | ICD-10-CM | POA: Diagnosis not present

## 2017-11-09 DIAGNOSIS — I959 Hypotension, unspecified: Secondary | ICD-10-CM | POA: Diagnosis not present

## 2017-11-09 DIAGNOSIS — C7951 Secondary malignant neoplasm of bone: Secondary | ICD-10-CM | POA: Diagnosis not present

## 2017-11-09 DIAGNOSIS — C439 Malignant melanoma of skin, unspecified: Secondary | ICD-10-CM

## 2017-11-09 DIAGNOSIS — C7800 Secondary malignant neoplasm of unspecified lung: Secondary | ICD-10-CM

## 2017-11-09 DIAGNOSIS — N179 Acute kidney failure, unspecified: Secondary | ICD-10-CM | POA: Diagnosis not present

## 2017-11-09 DIAGNOSIS — Z5112 Encounter for antineoplastic immunotherapy: Secondary | ICD-10-CM | POA: Diagnosis present

## 2017-11-09 LAB — CBC WITH DIFFERENTIAL/PLATELET
BASOS ABS: 0.1 10*3/uL (ref 0–0.1)
BASOS PCT: 1 %
EOS ABS: 0.3 10*3/uL (ref 0–0.7)
EOS PCT: 5 %
HEMATOCRIT: 25.3 % — AB (ref 40.0–52.0)
Hemoglobin: 8.2 g/dL — ABNORMAL LOW (ref 13.0–18.0)
Lymphocytes Relative: 12 %
Lymphs Abs: 0.7 10*3/uL — ABNORMAL LOW (ref 1.0–3.6)
MCH: 27 pg (ref 26.0–34.0)
MCHC: 32.3 g/dL (ref 32.0–36.0)
MCV: 83.8 fL (ref 80.0–100.0)
MONO ABS: 0.7 10*3/uL (ref 0.2–1.0)
MONOS PCT: 12 %
NEUTROS ABS: 4.1 10*3/uL (ref 1.4–6.5)
Neutrophils Relative %: 70 %
PLATELETS: 214 10*3/uL (ref 150–440)
RBC: 3.02 MIL/uL — ABNORMAL LOW (ref 4.40–5.90)
RDW: 15.3 % — AB (ref 11.5–14.5)
WBC: 5.8 10*3/uL (ref 3.8–10.6)

## 2017-11-09 LAB — COMPREHENSIVE METABOLIC PANEL
ALBUMIN: 2.1 g/dL — AB (ref 3.5–5.0)
ALK PHOS: 280 U/L — AB (ref 38–126)
ALT: 29 U/L (ref 17–63)
ANION GAP: 7 (ref 5–15)
AST: 25 U/L (ref 15–41)
BUN: 50 mg/dL — AB (ref 6–20)
CALCIUM: 8.2 mg/dL — AB (ref 8.9–10.3)
CO2: 18 mmol/L — AB (ref 22–32)
CREATININE: 1.96 mg/dL — AB (ref 0.61–1.24)
Chloride: 112 mmol/L — ABNORMAL HIGH (ref 101–111)
GFR calc Af Amer: 37 mL/min — ABNORMAL LOW (ref >60)
GFR calc non Af Amer: 32 mL/min — ABNORMAL LOW (ref >60)
GLUCOSE: 167 mg/dL — AB (ref 65–99)
Potassium: 4.9 mmol/L (ref 3.5–5.1)
SODIUM: 137 mmol/L (ref 135–145)
TOTAL PROTEIN: 5.7 g/dL — AB (ref 6.5–8.1)
Total Bilirubin: 0.4 mg/dL (ref 0.3–1.2)

## 2017-11-09 MED ORDER — ALPRAZOLAM 0.25 MG PO TABS: 0.2500 mg | ORAL_TABLET | Freq: Three times a day (TID) | ORAL | 0 refills | Status: AC | PRN

## 2017-11-09 MED ORDER — DIPHENHYDRAMINE HCL 50 MG/ML IJ SOLN
25.0000 mg | Freq: Once | INTRAMUSCULAR | Status: AC
  Administered 2017-11-09: 25 mg via INTRAVENOUS
  Filled 2017-11-09: qty 1

## 2017-11-09 MED ORDER — SODIUM CHLORIDE 0.9 % IV SOLN
140.0000 mg | Freq: Once | INTRAVENOUS | Status: AC
  Administered 2017-11-09: 140 mg via INTRAVENOUS
  Filled 2017-11-09: qty 10

## 2017-11-09 MED ORDER — MEGESTROL ACETATE 40 MG PO TABS: 40.0000 mg | ORAL_TABLET | Freq: Every day | ORAL | 2 refills | Status: AC

## 2017-11-09 MED ORDER — SODIUM CHLORIDE 0.9 % IV SOLN
Freq: Once | INTRAVENOUS | Status: AC
  Administered 2017-11-09: 10:00:00 via INTRAVENOUS
  Filled 2017-11-09: qty 1000

## 2017-11-09 MED ORDER — HEPARIN SOD (PORK) LOCK FLUSH 100 UNIT/ML IV SOLN
500.0000 [IU] | Freq: Once | INTRAVENOUS | Status: AC
Start: 2017-11-09 — End: 2017-11-09
  Administered 2017-11-09: 500 [IU] via INTRAVENOUS
  Filled 2017-11-09: qty 5

## 2017-11-09 MED ORDER — SODIUM CHLORIDE 0.9% FLUSH
10.0000 mL | INTRAVENOUS | Status: DC | PRN
  Administered 2017-11-09: 10 mL via INTRAVENOUS
  Filled 2017-11-09: qty 10

## 2017-11-09 MED ORDER — SODIUM CHLORIDE 0.9 % IV SOLN
450.0000 mg | Freq: Once | INTRAVENOUS | Status: AC
  Administered 2017-11-09: 450 mg via INTRAVENOUS
  Filled 2017-11-09: qty 80

## 2017-11-09 MED ORDER — FAMOTIDINE IN NACL 20-0.9 MG/50ML-% IV SOLN
20.0000 mg | Freq: Once | INTRAVENOUS | Status: AC
  Administered 2017-11-09: 20 mg via INTRAVENOUS
  Filled 2017-11-09: qty 50

## 2017-11-09 NOTE — Progress Notes (Signed)
The patient c/o decrease appetite and wife say he need something

## 2017-11-09 NOTE — Progress Notes (Signed)
Patient receiving treatment today per Dr. Finnegan 

## 2017-11-10 ENCOUNTER — Ambulatory Visit: Payer: PRIVATE HEALTH INSURANCE

## 2017-11-10 ENCOUNTER — Ambulatory Visit
Admission: RE | Admit: 2017-11-10 | Discharge: 2017-11-10 | Disposition: A | Discharge status: Home / Self Care | Payer: Medicare Other | Source: Ambulatory Visit | Attending: Radiation Oncology | Admitting: Radiation Oncology

## 2017-11-10 ENCOUNTER — Inpatient Hospital Stay: Payer: PRIVATE HEALTH INSURANCE

## 2017-11-10 ENCOUNTER — Ambulatory Visit: Payer: PRIVATE HEALTH INSURANCE | Admitting: Oncology

## 2017-11-10 ENCOUNTER — Other Ambulatory Visit: Payer: PRIVATE HEALTH INSURANCE

## 2017-11-10 DIAGNOSIS — C439 Malignant melanoma of skin, unspecified: Secondary | ICD-10-CM | POA: Diagnosis not present

## 2017-11-13 ENCOUNTER — Ambulatory Visit
Admission: RE | Admit: 2017-11-13 | Discharge: 2017-11-13 | Disposition: A | Discharge status: Home / Self Care | Payer: Medicare Other | Source: Ambulatory Visit | Attending: Radiation Oncology | Admitting: Radiation Oncology

## 2017-11-13 DIAGNOSIS — C439 Malignant melanoma of skin, unspecified: Secondary | ICD-10-CM | POA: Diagnosis not present

## 2017-11-24 ENCOUNTER — Telehealth: Payer: Self-pay | Admitting: Urology

## 2017-11-24 ENCOUNTER — Telehealth: Payer: Self-pay | Admitting: *Deleted

## 2017-11-24 NOTE — Telephone Encounter (Signed)
Received a call from Butch Penny at [facility] and the patient was in the hospital on 10-29-17 and left with a foley went to rehab and is now at home. They are requesting an order from [place] to remove his foley and do a void and trial in the home? He has now had the foley about a month. Is this something that we are able to provide them? Please advise Her direct # is [phone]   Thanks, Sharyn Lull

## 2017-11-24 NOTE — Telephone Encounter (Signed)
It is extremely unlikely that this gentleman is going to pass a voiding trial due to his overall medical condition.  Home health can remove his catheter if they want as long as they are willing to replace it later in the day when he is unable to void.  Hollice Espy, MD

## 2017-11-24 NOTE — Telephone Encounter (Signed)
Call received from home health RN with [facility]. RN saw pt this afternoon for visit regarding resumption of care for home health services. She reports pt was discharged from [facility] yesterday. While at home for visit pt became cool, clammy and diaphoretic. Pt reports feeling weak and dizzy at that time. VS were taken and bp found to be 80/42, per RN 3+ edema in bilateral lower extremities was also present. It was recommended by home health that pt be evaluated in the ER, at that time pt and family refused ER evaluation. I spoke with Dr. Grayland Ormond, he recommends pt be evaluated in the ER as well. Pts wife and home health RN have been notified with recommendations.

## 2017-11-25 ENCOUNTER — Other Ambulatory Visit: Payer: Self-pay

## 2017-11-25 ENCOUNTER — Emergency Department
Admission: EM | Admit: 2017-11-25 | Discharge: 2017-11-25 | Disposition: A | Discharge status: Home / Self Care | Payer: Medicare Other | Attending: Emergency Medicine | Admitting: Emergency Medicine

## 2017-11-25 DIAGNOSIS — E860 Dehydration: Secondary | ICD-10-CM | POA: Diagnosis not present

## 2017-11-25 DIAGNOSIS — N183 Chronic kidney disease, stage 3 (moderate): Secondary | ICD-10-CM | POA: Insufficient documentation

## 2017-11-25 DIAGNOSIS — R531 Weakness: Secondary | ICD-10-CM | POA: Diagnosis present

## 2017-11-25 DIAGNOSIS — I129 Hypertensive chronic kidney disease with stage 1 through stage 4 chronic kidney disease, or unspecified chronic kidney disease: Secondary | ICD-10-CM | POA: Diagnosis not present

## 2017-11-25 DIAGNOSIS — E1122 Type 2 diabetes mellitus with diabetic chronic kidney disease: Secondary | ICD-10-CM | POA: Diagnosis not present

## 2017-11-25 DIAGNOSIS — M6281 Muscle weakness (generalized): Secondary | ICD-10-CM | POA: Diagnosis not present

## 2017-11-25 DIAGNOSIS — C439 Malignant melanoma of skin, unspecified: Secondary | ICD-10-CM | POA: Diagnosis not present

## 2017-11-25 DIAGNOSIS — Z87891 Personal history of nicotine dependence: Secondary | ICD-10-CM | POA: Diagnosis not present

## 2017-11-25 DIAGNOSIS — Z8551 Personal history of malignant neoplasm of bladder: Secondary | ICD-10-CM | POA: Diagnosis not present

## 2017-11-25 DIAGNOSIS — C7951 Secondary malignant neoplasm of bone: Secondary | ICD-10-CM | POA: Insufficient documentation

## 2017-11-25 LAB — URINALYSIS, COMPLETE (UACMP) WITH MICROSCOPIC
Bacteria, UA: NONE SEEN
Bilirubin Urine: NEGATIVE
Glucose, UA: NEGATIVE mg/dL
Hgb urine dipstick: NEGATIVE
KETONES UR: NEGATIVE mg/dL
Nitrite: NEGATIVE
PH: 9 — AB (ref 5.0–8.0)
Protein, ur: 30 mg/dL — AB
Specific Gravity, Urine: 1.01 (ref 1.005–1.030)
Squamous Epithelial / LPF: NONE SEEN (ref 0–5)

## 2017-11-25 LAB — CBC WITH DIFFERENTIAL/PLATELET
Basophils Absolute: 0 10*3/uL (ref 0–0.1)
Basophils Relative: 1 %
Eosinophils Absolute: 0.3 10*3/uL (ref 0–0.7)
Eosinophils Relative: 6 %
HEMATOCRIT: 29 % — AB (ref 40.0–52.0)
HEMOGLOBIN: 9.3 g/dL — AB (ref 13.0–18.0)
LYMPHS ABS: 1.1 10*3/uL (ref 1.0–3.6)
LYMPHS PCT: 18 %
MCH: 26.5 pg (ref 26.0–34.0)
MCHC: 32.1 g/dL (ref 32.0–36.0)
MCV: 82.7 fL (ref 80.0–100.0)
Monocytes Absolute: 0.9 10*3/uL (ref 0.2–1.0)
Monocytes Relative: 15 %
Neutro Abs: 3.6 10*3/uL (ref 1.4–6.5)
Neutrophils Relative %: 60 %
Platelets: 211 10*3/uL (ref 150–440)
RBC: 3.5 MIL/uL — AB (ref 4.40–5.90)
RDW: 17 % — ABNORMAL HIGH (ref 11.5–14.5)
WBC: 6 10*3/uL (ref 3.8–10.6)

## 2017-11-25 LAB — COMPREHENSIVE METABOLIC PANEL
ALK PHOS: 212 U/L — AB (ref 38–126)
ALT: 22 U/L (ref 17–63)
AST: 23 U/L (ref 15–41)
Albumin: 2 g/dL — ABNORMAL LOW (ref 3.5–5.0)
Anion gap: 7 (ref 5–15)
BUN: 63 mg/dL — ABNORMAL HIGH (ref 6–20)
CALCIUM: 8.1 mg/dL — AB (ref 8.9–10.3)
CO2: 22 mmol/L (ref 22–32)
Chloride: 108 mmol/L (ref 101–111)
Creatinine, Ser: 2.21 mg/dL — ABNORMAL HIGH (ref 0.61–1.24)
GFR, EST AFRICAN AMERICAN: 32 mL/min — AB (ref >60)
GFR, EST NON AFRICAN AMERICAN: 28 mL/min — AB (ref >60)
Glucose, Bld: 191 mg/dL — ABNORMAL HIGH (ref 65–99)
Potassium: 4.2 mmol/L (ref 3.5–5.1)
SODIUM: 137 mmol/L (ref 135–145)
Total Bilirubin: 0.6 mg/dL (ref 0.3–1.2)
Total Protein: 5.7 g/dL — ABNORMAL LOW (ref 6.5–8.1)

## 2017-11-25 LAB — URINE DRUG SCREEN, QUALITATIVE (ARMC ONLY)
AMPHETAMINES, UR SCREEN: NOT DETECTED
Barbiturates, Ur Screen: NOT DETECTED
Benzodiazepine, Ur Scrn: POSITIVE — AB
Cannabinoid 50 Ng, Ur ~~LOC~~: NOT DETECTED
Cocaine Metabolite,Ur ~~LOC~~: NOT DETECTED
MDMA (Ecstasy)Ur Screen: NOT DETECTED
Methadone Scn, Ur: NOT DETECTED
OPIATE, UR SCREEN: NOT DETECTED
PHENCYCLIDINE (PCP) UR S: NOT DETECTED
Tricyclic, Ur Screen: NOT DETECTED

## 2017-11-25 LAB — TROPONIN I: Troponin I: 0.03 ng/mL (ref <0.03)

## 2017-11-25 MED ORDER — HYDROMORPHONE HCL 1 MG/ML IJ SOLN
INTRAMUSCULAR | Status: AC
  Filled 2017-11-25: qty 1

## 2017-11-25 MED ORDER — CEFTRIAXONE SODIUM 1 G IJ SOLR: 1.0000 g | Freq: Once | INTRAMUSCULAR | Status: DC

## 2017-11-25 MED ORDER — SODIUM CHLORIDE 0.9 % IV SOLN
1.0000 g | Freq: Once | INTRAVENOUS | Status: AC
  Administered 2017-11-25: 1 g via INTRAVENOUS
  Filled 2017-11-25: qty 10

## 2017-11-25 MED ORDER — SODIUM CHLORIDE 0.9 % IV SOLN
Freq: Once | INTRAVENOUS | Status: AC
  Administered 2017-11-25: 15:00:00 via INTRAVENOUS

## 2017-11-25 MED ORDER — CEPHALEXIN 250 MG PO CAPS: 250.0000 mg | ORAL_CAPSULE | Freq: Four times a day (QID) | ORAL | 0 refills | Status: AC

## 2017-11-25 MED ORDER — HYDROMORPHONE HCL 1 MG/ML IJ SOLN
1.0000 mg | Freq: Once | INTRAMUSCULAR | Status: AC
  Administered 2017-11-25: 1 mg via INTRAMUSCULAR

## 2017-11-25 NOTE — ED Notes (Signed)
MD at bedside speaking with family 

## 2017-11-25 NOTE — ED Notes (Signed)
Family stating they need a wheelchair and they need to go. Family stating that pt is in a lot of pain and hasn't had any pain meds. I informed family that patient can have pain medication, but that he stated to me that he wasn't having any pain. I also advised family that doctor had put in social work consult and family states they are ready to go and don't want to have the consult. Family states they just need to go. Told family that I will let MD Jimmye Norman know.

## 2017-11-25 NOTE — ED Notes (Signed)
Family stating that they have not seen a doctor and asking when will he been in. RN advised family that MD Beg was at bedside when patient arrived and that I will let him know that family is in room.

## 2017-11-25 NOTE — ED Notes (Addendum)
Lab notified to add on Urine Drug Screen

## 2017-11-25 NOTE — ED Triage Notes (Addendum)
Pt comes via [facility] from home with c/o generalized weakness and bilateral extemity swelling. Weeping edema noted to right lower leg. Pt recently finished radiation treatment. EMs gave 400 mL NS. Wife reports BP of 80/50 this am. Pt is alert & Matilde Sprang MD Williams at bedside

## 2017-11-25 NOTE — ED Notes (Signed)
Family advised that EMS has been called for transport back home.

## 2017-11-25 NOTE — ED Provider Notes (Signed)
[facility] Emergency Department Provider Note       Time seen: ----------------------------------------- 3:06 PM on 11/25/2017 -----------------------------------------   I have reviewed the triage vital signs and the nursing notes.  HISTORY   Chief Complaint Weakness    HPI Tyler Henderson is a 72 y.o. male with a history of metastatic melanoma, depression, diabetes, GERD, hypertension, restless leg syndrome who presents to the ED for weakness and peripheral edema.  Patient recently finished radiation treatment for skin cancer.  EMS gave the patient 400 cc of normal saline and he was found to have hypotension at home.  Reported blood pressure at that time was 80/50.  He denies fevers, chills, chest pain, shortness of breath, vomiting or diarrhea.  He describes poor p.o. intake.  Past Medical History:  Diagnosis Date  . Anxiety   . Arthritis   . Bladder cancer ([place])   . Chronic kidney disease   . Depression   . Diabetes ([place])   . Dysrhythmia    atrial fib  . GERD (gastroesophageal reflux disease)   . Hypertension   . Restless leg syndrome   . Shortness of breath dyspnea    with exertion  . Skin cancer   . Sleep apnea    CPAP "once in awhile"  . 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 Constipation   . Palliative care by specialist   . DNR (do not resuscitate)   . Cancer associated pain   . Generalized weakness 10/29/2017  . 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 Metastasis from malignant melanoma of skin ([place])   . Acute kidney injury ([place]) 10/23/2017  . Urinary retention   . Metastatic melanoma ([place]) 10/15/2017  . Goals of care, counseling/discussion 10/15/2017  . Mass of upper lobe of left lung 10/08/2017  . Liver masses 09/17/2017  . Microscopic hematuria 03/07/2016  . Simple renal cyst 03/07/2016  . Nephrolithiasis 03/07/2016  . Chronic renal failure, stage 3 (moderate) ([place])  03/07/2016    Past Surgical History:  Procedure Laterality Date  . APPENDECTOMY    . JOINT REPLACEMENT Left    Total Knee Replacement  . KNEE ARTHROSCOPY Left   . PORTA CATH INSERTION N/A 10/19/2017   Procedure: PORTA CATH INSERTION;  Surgeon: Algernon Huxley, MD;  Location: [facility];  Service: Cardiovascular;  Laterality: N/A;  . REPLACEMENT TOTAL KNEE Left 2012   Dr. Little Ishikawa, Jr. [facility]  . TRANSURETHRAL RESECTION OF BLADDER TUMOR N/A 03/14/2016   Procedure: TRANSURETHRAL RESECTION OF BLADDER TUMOR (TURBT);  Surgeon: Hollice Espy, MD;  Location: [facility];  Service: Urology;  Laterality: N/A;    Allergies Patient has no known allergies.  Social History Social History   Tobacco Use  . Smoking status: Former Smoker    Packs/day: 1.00    Years: 20.00    Pack years: 20.00    Types: Cigarettes    Last attempt to quit: 1979    Years since quitting: 40.3  . Smokeless tobacco: Never Used  Substance Use Topics  . Alcohol use: Yes    Alcohol/week: 1.2 oz    Types: 2 Cans of beer per week    Comment: occassional  . Drug use: No   Review of Systems Constitutional: Negative for fever. Cardiovascular: Negative for chest pain. Respiratory: Negative for shortness of breath. Gastrointestinal: Negative for abdominal pain, vomiting and diarrhea. Genitourinary: Negative for dysuria. Musculoskeletal: Positive for peripheral edema Skin: Negative  for rash. Neurological: Positive for generalized weakness  All systems negative/normal/unremarkable except as stated in the HPI  ____________________________________________   PHYSICAL EXAM:  VITAL SIGNS: ED Triage Vitals  Enc Vitals Group     BP 11/25/17 1501 114/70     Pulse Rate 11/25/17 1501 99     Resp 11/25/17 1501 19     Temp 11/25/17 1501 97.8 F (36.6 C)     Temp Source 11/25/17 1501 Oral     SpO2 11/25/17 1501 99 %     Weight 11/25/17 1500 (!) 330 lb (149.7 kg)     Height 11/25/17 1457 6\' 1"  (1.854 m)      Head Circumference --      Peak Flow --      Pain Score --      Pain Loc --      Pain Edu? --      Excl. in [place]? --    Constitutional: Alert and oriented. Well appearing and in no distress. Eyes: Conjunctivae are pale.  Normal extraocular movements. ENT   Head: Normocephalic and atraumatic.   Nose: No congestion/rhinnorhea.   Mouth/Throat: Mucous membranes are moist.   Neck: No stridor. Cardiovascular: Normal rate, regular rhythm. No murmurs, rubs, or gallops. Respiratory: Normal respiratory effort without tachypnea nor retractions. Breath sounds are clear and equal bilaterally. No wheezes/rales/rhonchi. Gastrointestinal: Soft and nontender. Normal bowel sounds Musculoskeletal: Nontender with normal range of motion in extremities.  Bilateral pitting edema is noted Neurologic:  Normal speech and language. No gross focal neurologic deficits are appreciated.  Generalized weakness, nothing focal Skin:  Skin is warm, dry and intact. No rash noted. Psychiatric: Mood and affect are normal. Speech and behavior are normal.  ____________________________________________  EKG: Interpreted by me.  Atrial fibrillation with a rate of 99 bpm, wide QRS likely indicative of right bundle branch block, QT, ST depressions are noted  ____________________________________________  ED COURSE:  As part of my medical decision making, I reviewed the following data within the [place] History obtained from family if available, nursing notes, old chart and ekg, as well as notes from prior ED visits. Patient presented for weakness and hypotension, we will assess with labs and imaging as indicated at this time.   Procedures ____________________________________________   LABS (pertinent positives/negatives)  Labs Reviewed  CBC WITH DIFFERENTIAL/PLATELET - Abnormal; Notable for the following components:      Result Value   RBC 3.50 (*)    Hemoglobin 9.3 (*)    HCT 29.0 (*)     RDW 17.0 (*)    All other components within normal limits  COMPREHENSIVE METABOLIC PANEL - Abnormal; Notable for the following components:   Glucose, Bld 191 (*)    BUN 63 (*)    Creatinine, Ser 2.21 (*)    Calcium 8.1 (*)    Total Protein 5.7 (*)    Albumin 2.0 (*)    Alkaline Phosphatase 212 (*)    GFR calc non Af Amer 28 (*)    GFR calc Af Amer 32 (*)    All other components within normal limits  TROPONIN I - Abnormal; Notable for the following components:   Troponin I 0.03 (*)    All other components within normal limits  URINALYSIS, COMPLETE (UACMP) WITH MICROSCOPIC - Abnormal; Notable for the following components:   Color, Urine YELLOW (*)    APPearance HAZY (*)    pH 9.0 (*)    Protein, ur 30 (*)    Leukocytes, UA  LARGE (*)    All other components within normal limits  URINE DRUG SCREEN, QUALITATIVE ([facility] ONLY) - Abnormal; Notable for the following components:   Benzodiazepine, Ur Scrn POSITIVE (*)    All other components within normal limits  CBG MONITORING, ED  TYPE AND SCREEN  TYPE AND SCREEN  ____________________________________________  DIFFERENTIAL DIAGNOSIS   Hydration, electrolyte abnormality, anemia  FINAL ASSESSMENT AND PLAN  Weakness, dehydration   Plan: The patient had presented for generalized weakness and edema and overall difficulty caring for him at home by his family. Patient's labs do indicate some dehydration for which she was given IV fluids, there was also concerns about a UTI so we changed out his Foley catheter.  I spent an extensive amount of time speaking with the hospice nurse as well as social work trying to figure out some solution for him.  In the and his family will take him home and pursue hospice care outside the hospital.  They are not willing to wait to talk to social work about placing him back into a facility and I am not sure that he is in the last 2 weeks of life for him to go the hospice home.  Laurence Aly,  MD   Note: This note was generated in part or whole with voice recognition software. Voice recognition is usually quite accurate but there are transcription errors that can and very often do occur. I apologize for any typographical errors that were not detected and corrected.     Earleen Newport, MD 11/25/17 1900

## 2017-11-25 NOTE — ED Notes (Addendum)
Family standing in the hallway stating "He needs to get out of here. He is in a lot of pain and has bone cancer. Someone needs to call the rescue squad to take him home." Sam,RN aware and will inform MD Colleran once he is available. Family continuing to stand in hallway and at door.

## 2017-11-25 NOTE — ED Notes (Signed)
Called for [facility] to transport to [place]

## 2017-11-25 NOTE — ED Notes (Signed)
Pt repositioned in bed and warm blanket placed on pt. Family at bedside.

## 2017-11-25 NOTE — ED Notes (Signed)
Pt's family updated on plan of care. MD Coggeshall spoke with family. Plan for social work consult.

## 2017-11-26 NOTE — Progress Notes (Signed)
CSW Director was contacted by Dr. Jimmye Norman at approx 6:45 pm on 5/4 concerning Mr. Whitford and his family's interest in hospice and possible Hospice Home placement after they had spoken earlier in the day with a representative of [facility].  Dr. Jimmye Norman shared that Mr. Deziel was not in need of inpatient admission as there was no acute problem but that he was in support of hospice referral.    Discussed that it wasn't an option to facilitate hospice admission assessment and possible Hospice Home admission at that time but agreed to make referral to [facility] with request for review for possible Hospice Home admission from home per the family's request.  Also advised Dr. Jimmye Norman that, as it may take 1 - 2 days to complete the hospice admission process, in the interim the family's options were to pursue readmission to SNF as private pay or to care for him at home.  Dr. Jimmye Norman agreed to share this with the family and also to offer them my cell phone number in case they wished to call me.  I have not heard from them.   This morning (11/26/17) I have made referral to [facility] Mariann Laster, on-call RN, [phone]).  Mariann Laster is familiar with Mr. Polinsky from her conversation with his family yesterday and will follow up.  ED CSW working today is faxing facesheet, ED notes, and most recent inpatient H&P and/or DC summary to [phone].  Daphene Calamity, ACSW, Investment banker, operational, Clinical Social Work and Care Management [phone]

## 2017-11-27 ENCOUNTER — Ambulatory Visit: Payer: Medicare Other | Admitting: Urology

## 2017-11-27 ENCOUNTER — Telehealth: Payer: Self-pay | Admitting: *Deleted

## 2017-11-27 NOTE — Telephone Encounter (Signed)
Patient wife called to report that patient is doing poorly and not tolerating treatment at all. He has decided to stop treatment and they have opted to go with Hospice care at home. She states nurse is supposed to come out today to open him to services and thanks Dr Grayland Ormond for all that he has done for him,  Appointments cancelled

## 2017-11-27 NOTE — Telephone Encounter (Signed)
Ok, thank you.  Will Sign the hospice orders.

## 2017-11-27 NOTE — Telephone Encounter (Signed)
Called and spoke w/ Butch Penny at [facility], she states that she would inform her manager about Dr. Cherrie Gauze advice. She states she was at the pt house recently and he is not doing well at all. She states the family is currently leaning towards hospice care for the pt. She states wife was the one asking for foley removal, but very doubtful they will remove. She will call us and inform us if the foley is removed.

## 2017-11-28 ENCOUNTER — Other Ambulatory Visit: Payer: Self-pay | Admitting: *Deleted

## 2017-11-28 MED ORDER — MORPHINE SULFATE (CONCENTRATE) 20 MG/ML PO SOLN: 5.0000 mg | ORAL | 0 refills | Status: AC | PRN

## 2017-11-28 MED ORDER — TRAZODONE HCL 100 MG PO TABS: 100.0000 mg | ORAL_TABLET | Freq: Every day | ORAL | 2 refills | Status: AC

## 2017-11-28 MED ORDER — MORPHINE SULFATE ER 30 MG PO TBCR: 30.0000 mg | EXTENDED_RELEASE_TABLET | Freq: Two times a day (BID) | ORAL | 0 refills | Status: AC

## 2017-11-28 NOTE — Telephone Encounter (Signed)
Yes to all. 

## 2017-11-28 NOTE — Telephone Encounter (Signed)
Hospice nurse called reporting that patient is having uncontrolled pain and is not on a long acting pain medicine. Requesting MS ER and Roxanol prescription to be ordered and also reports he is not sleeping well and would like to increase his Trazodone to 100 mg. He is now bed bound and he fell yesterday requiring fore department to come and get him up out of the floor. Please advise

## 2017-11-28 NOTE — Telephone Encounter (Signed)
Per VO Dr Grayland Ormond, MS ER 30 mg bid

## 2017-11-28 NOTE — Telephone Encounter (Signed)
Tyler Henderson informed of orders

## 2017-11-28 NOTE — Telephone Encounter (Signed)
What dose of MS ER do you want, he is currently on Oxycodone 10 mg every 6 hours

## 2017-11-29 ENCOUNTER — Telehealth: Payer: Self-pay | Admitting: *Deleted

## 2017-11-30 ENCOUNTER — Ambulatory Visit: Payer: PRIVATE HEALTH INSURANCE | Admitting: Oncology

## 2017-11-30 ENCOUNTER — Other Ambulatory Visit: Payer: PRIVATE HEALTH INSURANCE

## 2017-11-30 ENCOUNTER — Ambulatory Visit: Payer: PRIVATE HEALTH INSURANCE

## 2017-12-01 ENCOUNTER — Other Ambulatory Visit: Payer: Self-pay | Admitting: *Deleted

## 2017-12-06 ENCOUNTER — Ambulatory Visit: Payer: Medicare Other | Admitting: Urology

## 2017-12-20 ENCOUNTER — Ambulatory Visit: Payer: PRIVATE HEALTH INSURANCE | Admitting: Radiation Oncology

## 2017-12-23 NOTE — Telephone Encounter (Signed)
[facility] called to report that patient expired this morning at 10:03 AM at home

## 2017-12-23 DEATH — deceased

## 2020-01-28 IMAGING — CT NM PET TUM IMG INITIAL (PI) SKULL BASE T - THIGH
1 of 10 series · 2 of 25 positions shown · non-contrast
Comparison: CT abdomen 07/14/2017

CLINICAL DATA: Initial treatment strategy for liver mass.

EXAM:
NUCLEAR MEDICINE PET SKULL BASE TO THIGH
TECHNIQUE: 13.2 mCi F-18 FDG was injected intravenously. Full-ring PET imaging
was performed from the skull base to thigh after the radiotracer. CT
data was obtained and used for attenuation correction and anatomic
localization.
Fasting blood glucose: 224 mg/dl
Mediastinal blood pool activity: SUV max

[Series 4: ct wb 5.0 b30f · axial · 5.0mm · 0.98mm/px · z∈[-1460,-476]mm · 2 of 329 slices shown]
[im 1/329  brain]
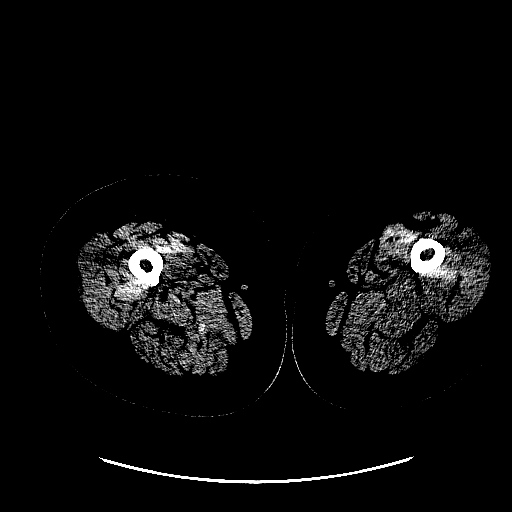
[im 329/329  brain]
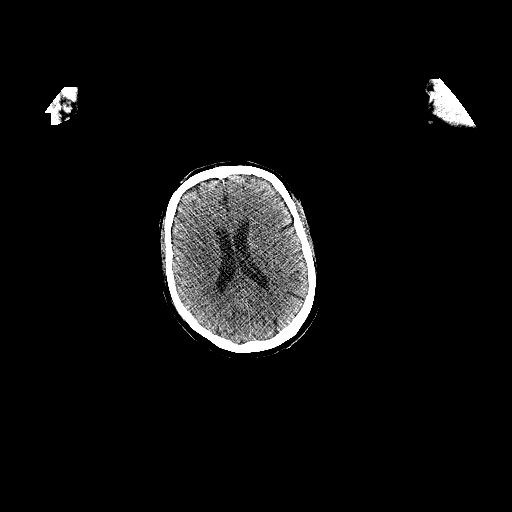

[2 of 25 positions shown; findings below may reference images not displayed]

FINDINGS: The PET data is somewhat grainy, some of this may be due to the
patient's hyperglycemia.

NECK: There is hypodensity in the CT data in the right medial
cerebellum on image [DATE], some of this may well be due to streak
artifact and I do not discern a definite abnormality in this
vicinity on the PET data, but this might prompt further brain
imaging.

Anterior to the right thyroid lobe there is a 4.1 by 2.3 cm mass or
nodule with a faint linear internal calcification and maximum SUV of
3.5. The parenchymal component has a fairly low density at 27
Hounsfield units.

Incidental CT findings: None

CHEST:

A smoothly marginated 4.9 by 4.6 cm left upper lobe mass in the
apical segment has a maximum SUV of 10.9, favoring malignancy.

A separate left upper lobe mass along the mediastinal margin, versus
a large AP window lymph node, measures proximally 7.9 by 4.2 cm on
image 91/4, and has a milder degree of peripheral hypermetabolic
activity, with maximum SUV of 6.7. This extends back to the level of
the left hilum.

There are numerous additional scattered small nodules in both lungs.
The tiny nodules do not have demonstrable metabolic activity. A
by 1.2 cm rounded nodule in the right lower lobe on image 109/4 has
a maximum SUV of 1.9.

Incidental CT findings: Trace left pleural effusion. Moderate
cardiomegaly. Coronary, aortic arch, and branch vessel
atherosclerotic vascular disease.

ABDOMEN/PELVIS: Scattered hypodense liver masses are indistinctly
seen in the liver parenchyma. These appear to have mildly
accentuated activity compared to background liver activity. For
example, an approximately 4.5 cm mass believed to primarily be in
segment 4a of the liver has a maximum SUV of 9.0. The inferior mass
in segment 6 measures about 5.8 cm and has a maximum SUV of 7.7.
Background hepatic activity is difficult to measure due to the mass
is but may be as much as 4.6. The masses do appear to be larger than
on 07/15/2007.

A small right adrenal mass is probably an adenoma given the
low-density, and measures 1.6 by 1.4 cm. There is only low-grade
associated metabolic activity.

Exophytic photopenic lesion from the left kidney compatible with a
cyst. Scattered multifocal hypermetabolic bowel activity favoring
physiologic activity, without definite CT correlate.

Incidental CT findings: Although not appreciably hypermetabolic,
there is new nodularity along the liver margin and right paracolic
gutter. For example, 1 nodule on image 188/4 measures 2.1 by 1.0 cm.
2 lymph nodes along the gastrohepatic ligament measuring 1.2 and
cm on image 152/4, and were not previously visible.

SKELETON: There is abnormal hypermetabolic activity along the
prominent anterior spurring along the right sacroiliac joint. There
is new (compared to 09/14/2016) bony demineralization/destruction of
this spurring along with accentuated soft tissue density along the
iloipsoas in this region, with maximum standard uptake value 9.4.
This does not appear to be distributed throughout the SI joint and
accordingly I am skeptical of septic arthritis.

Increasing conspicuity of a 2.4 cm lytic lesion of the right L4
vertebral body causing new demineralization of the posterior cortex,
maximum SUV 7.9.

There is more subtle lytic lesion in the right T11 vertebral body
measuring 1.7 by 1.3 cm on image 141/4 with maximum SUV 6.4.

Incidental CT findings: none
IMPRESSION: 1. The PET data is noisy on today's exam, possibly due to a
combination of the patient's body habitus and hyperglycemia during
imaging. The information is felt to be diagnostic in quality but has
reduced sensitivity for subtle lesions.
2. The most hypermetabolic left upper lobe mass measures 4.9 cm in
diameter with maximum SUV of 10.9, compatible with malignancy.
Separate left upper lobe mass along the mediastinal margin could be
a pulmonary mass or a large AP window lymph node, measuring up to
7.9 cm with maximum SUV 6.7. Numerous scattered nodules in both
lungs are present, most of which are too small to characterize, but
are significantly increased in number and size compared to
07/14/2017..
3. Scattered hypodense masses in the liver. Index masses are mild to
moderately hypermetabolic compared to the liver and favor diffuse
hepatic metastatic disease.
4. New tumor nodules along the liver capsule and right-sided
omentum, along with new adenopathy in the AP window, suspicious for
malignancy but not measurably hypermetabolic.
5. Three areas of suspicion for osseous metastatic disease. There is
a 2.4 cm lytic right L4 vertebral body lesion which is
hypermetabolic. Also a 1.7 cm T11 vertebral body lesion, mildly
hypermetabolic. Finally, there is increased soft tissue density and
erosion of the anterior bony spur along the right SI joint with
local hypermetabolic activity suspicious for malignancy.
6. Anterior to the right thyroid lobe a 4.1 by 2.3 cm mass or nodule
is present with maximum SUV 3.5. I am uncertain whether this is
simply a benign thyroid nodule or low-grade tumor related to the
metastatic process.
7. Questionable hypodensity in the right cerebellum, probably
artifact related to streak from the skull base, but consider
dedicated brain imaging for further characterization.
8. Other imaging findings of potential clinical significance: Trace
left pleural effusion. Moderate cardiomegaly. Aortic Atherosclerosis
([id number]-AA0.0). Coronary atherosclerosis. Small right adrenal mass
suspected of being an adenoma based on density.
